# Patient Record
Sex: Male | Born: 1950 | Race: Black or African American | Hispanic: No | Marital: Married | Smoking: Never smoker
Health system: Southern US, Community
[De-identification: ages and names within clinical notes are randomized; demographics above are authoritative.]

## PROBLEM LIST (undated history)

## (undated) DIAGNOSIS — E119 Type 2 diabetes mellitus without complications: Secondary | ICD-10-CM

## (undated) DIAGNOSIS — Z9889 Other specified postprocedural states: Secondary | ICD-10-CM

## (undated) DIAGNOSIS — E785 Hyperlipidemia, unspecified: Secondary | ICD-10-CM

## (undated) DIAGNOSIS — C61 Malignant neoplasm of prostate: Secondary | ICD-10-CM

## (undated) HISTORY — DX: Hyperlipidemia, unspecified: E78.5

## (undated) HISTORY — PX: OTHER SURGICAL HISTORY: SHX169

---

## 1984-08-26 HISTORY — PX: OTHER SURGICAL HISTORY: SHX169

## 2001-02-19 ENCOUNTER — Encounter: Admission: RE | Admit: 2001-02-19 | Discharge: 2001-02-19 | Payer: Self-pay | Admitting: Family Medicine

## 2001-02-19 ENCOUNTER — Encounter: Payer: Self-pay | Admitting: Family Medicine

## 2001-02-23 ENCOUNTER — Encounter: Payer: Self-pay | Admitting: Family Medicine

## 2001-02-23 ENCOUNTER — Encounter: Admission: RE | Admit: 2001-02-23 | Discharge: 2001-02-23 | Payer: Self-pay | Admitting: Family Medicine

## 2001-08-12 ENCOUNTER — Encounter: Payer: Self-pay | Admitting: Family Medicine

## 2001-08-12 ENCOUNTER — Encounter: Admission: RE | Admit: 2001-08-12 | Discharge: 2001-08-12 | Payer: Self-pay | Admitting: Family Medicine

## 2002-12-24 ENCOUNTER — Encounter: Payer: Self-pay | Admitting: Internal Medicine

## 2002-12-24 ENCOUNTER — Encounter: Admission: RE | Admit: 2002-12-24 | Discharge: 2002-12-24 | Payer: Self-pay | Admitting: Internal Medicine

## 2003-11-15 ENCOUNTER — Encounter: Admission: RE | Admit: 2003-11-15 | Discharge: 2003-11-15 | Payer: Self-pay | Admitting: Internal Medicine

## 2004-07-10 ENCOUNTER — Encounter: Admission: RE | Admit: 2004-07-10 | Discharge: 2004-07-10 | Payer: Self-pay | Admitting: Internal Medicine

## 2005-03-25 ENCOUNTER — Encounter: Admission: RE | Admit: 2005-03-25 | Discharge: 2005-03-25 | Payer: Self-pay | Admitting: Internal Medicine

## 2005-06-12 ENCOUNTER — Encounter: Admission: RE | Admit: 2005-06-12 | Discharge: 2005-06-12 | Payer: Self-pay | Admitting: Internal Medicine

## 2007-04-15 ENCOUNTER — Ambulatory Visit (HOSPITAL_COMMUNITY): Admission: RE | Admit: 2007-04-15 | Discharge: 2007-04-15 | Payer: Self-pay | Admitting: Occupational Medicine

## 2007-06-15 ENCOUNTER — Encounter: Admission: RE | Admit: 2007-06-15 | Discharge: 2007-06-15 | Payer: Self-pay | Admitting: Internal Medicine

## 2007-09-09 ENCOUNTER — Emergency Department (HOSPITAL_COMMUNITY): Admission: EM | Admit: 2007-09-09 | Discharge: 2007-09-09 | Payer: Self-pay | Admitting: Emergency Medicine

## 2008-07-08 ENCOUNTER — Emergency Department (HOSPITAL_BASED_OUTPATIENT_CLINIC_OR_DEPARTMENT_OTHER): Admission: EM | Admit: 2008-07-08 | Discharge: 2008-07-08 | Payer: Self-pay | Admitting: Emergency Medicine

## 2008-07-27 ENCOUNTER — Encounter: Admission: RE | Admit: 2008-07-27 | Discharge: 2008-07-27 | Payer: Self-pay | Admitting: Internal Medicine

## 2008-10-02 ENCOUNTER — Ambulatory Visit: Payer: Self-pay | Admitting: Interventional Radiology

## 2008-10-02 ENCOUNTER — Emergency Department (HOSPITAL_BASED_OUTPATIENT_CLINIC_OR_DEPARTMENT_OTHER): Admission: EM | Admit: 2008-10-02 | Discharge: 2008-10-02 | Payer: Self-pay | Admitting: Emergency Medicine

## 2008-10-03 ENCOUNTER — Inpatient Hospital Stay (HOSPITAL_COMMUNITY): Admission: EM | Admit: 2008-10-03 | Discharge: 2008-10-04 | Payer: Self-pay | Admitting: Emergency Medicine

## 2009-05-18 ENCOUNTER — Encounter: Admission: RE | Admit: 2009-05-18 | Discharge: 2009-05-18 | Payer: Self-pay | Admitting: Internal Medicine

## 2010-12-11 LAB — PROTIME-INR
INR: 1 (ref 0.00–1.49)
Prothrombin Time: 14.6 seconds (ref 11.6–15.2)

## 2010-12-11 LAB — CBC
HCT: 44.8 % (ref 39.0–52.0)
Hemoglobin: 15.5 g/dL (ref 13.0–17.0)
Hemoglobin: 15.6 g/dL (ref 13.0–17.0)
MCHC: 33.9 g/dL (ref 30.0–36.0)
MCHC: 33.9 g/dL (ref 30.0–36.0)
MCV: 83.9 fL (ref 78.0–100.0)
MCV: 83.9 fL (ref 78.0–100.0)
Platelets: 181 10*3/uL (ref 150–400)
Platelets: 189 10*3/uL (ref 150–400)
RBC: 5.45 MIL/uL (ref 4.22–5.81)
RBC: 5.51 MIL/uL (ref 4.22–5.81)
RBC: 5.69 MIL/uL (ref 4.22–5.81)
RDW: 12.5 % (ref 11.5–15.5)
RDW: 11.7 % (ref 11.5–15.5)
WBC: 5 10*3/uL (ref 4.0–10.5)
WBC: 6.5 10*3/uL (ref 4.0–10.5)

## 2010-12-11 LAB — BASIC METABOLIC PANEL
BUN: 11 mg/dL (ref 6–23)
Calcium: 9.6 mg/dL (ref 8.4–10.5)
Creatinine, Ser: 1.02 mg/dL (ref 0.4–1.5)
Creatinine, Ser: 1.1 mg/dL (ref 0.4–1.5)
GFR calc Af Amer: >60 mL/min (ref >60)
GFR calc non Af Amer: >60 mL/min (ref >60)
GFR calc non Af Amer: >60 mL/min (ref >60)
Glucose, Bld: 82 mg/dL (ref 70–99)
Sodium: 141 mEq/L (ref 135–145)

## 2010-12-11 LAB — COMPREHENSIVE METABOLIC PANEL
Albumin: 4 g/dL (ref 3.5–5.2)
Alkaline Phosphatase: 102 U/L (ref 39–117)
BUN: 13 mg/dL (ref 6–23)
CO2: 29 mEq/L (ref 19–32)
Calcium: 8.8 mg/dL (ref 8.4–10.5)
GFR calc Af Amer: >60 mL/min (ref >60)
GFR calc non Af Amer: >60 mL/min (ref >60)
Glucose, Bld: 147 mg/dL — ABNORMAL HIGH (ref 70–99)
Potassium: 3.3 mEq/L — ABNORMAL LOW (ref 3.5–5.1)

## 2010-12-11 LAB — CARDIAC PANEL(CRET KIN+CKTOT+MB+TROPI)
Relative Index: 1 (ref 0.0–2.5)
Total CK: 208 U/L (ref 7–232)
Troponin I: 0.01 ng/mL (ref 0.00–0.06)

## 2010-12-11 LAB — DIFFERENTIAL
Basophils Absolute: 0 10*3/uL (ref 0.0–0.1)
Basophils Absolute: 0 10*3/uL (ref 0.0–0.1)
Basophils Relative: 1 % (ref 0–1)
Basophils Relative: 1 % (ref 0–1)
Eosinophils Absolute: 0.1 10*3/uL (ref 0.0–0.7)
Eosinophils Relative: 2 % (ref 0–5)
Lymphocytes Relative: 37 % (ref 12–46)
Lymphocytes Relative: 46 % (ref 12–46)
Lymphs Abs: 1.7 10*3/uL (ref 0.7–4.0)
Monocytes Absolute: 0.3 10*3/uL (ref 0.1–1.0)
Monocytes Absolute: 0.3 10*3/uL (ref 0.1–1.0)
Neutro Abs: 2.5 10*3/uL (ref 1.7–7.7)
Neutro Abs: 2.3 10*3/uL (ref 1.7–7.7)
Neutrophils Relative %: 54 % (ref 43–77)
Neutrophils Relative %: 45 % (ref 43–77)

## 2010-12-11 LAB — LIPID PANEL
HDL: 22 mg/dL — ABNORMAL LOW (ref >39)
Total CHOL/HDL Ratio: 8 RATIO
Triglycerides: 176 mg/dL — ABNORMAL HIGH (ref <150)

## 2010-12-11 LAB — CK TOTAL AND CKMB (NOT AT ARMC): Relative Index: 1.1 (ref 0.0–2.5)

## 2010-12-11 LAB — POCT CARDIAC MARKERS: Troponin i, poc: <0.05 ng/mL (ref 0.00–0.09)

## 2010-12-11 LAB — HIGH SENSITIVITY CRP: CRP, High Sensitivity: 3.7 mg/L — ABNORMAL HIGH

## 2010-12-11 LAB — PROTHROMBIN GENE MUTATION

## 2010-12-11 LAB — APTT: aPTT: 33 seconds (ref 24–37)

## 2010-12-11 LAB — HEPARIN LEVEL (UNFRACTIONATED): Heparin Unfractionated: 1 IU/mL — ABNORMAL HIGH (ref 0.30–0.70)

## 2011-01-08 NOTE — Discharge Summary (Signed)
Nathan [name], Nathan [name]              ACCOUNT NO.:  [id number]   MEDICAL RECORD NO.:  [id number]          PATIENT TYPE:  INP   LOCATION:  2013                         FACILITY:  [facility]   PHYSICIAN:  Eduardo Osier. Sharyn Lull, M.D. DATE OF BIRTH:  1950/10/14   DATE OF ADMISSION:  10/03/2008  DATE OF DISCHARGE:  10/04/2008                               DISCHARGE SUMMARY   ADMITTING DIAGNOSIS:  New-onset angina, rule out myocardial infarction.   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   DISCHARGE HOME MEDICATIONS:  1. Enteric-coated aspirin 81 mg 1 tablet daily.  2. Proscar 5 mg 1 tablet daily.  3. Nexium 40 mg 1 capsule daily half an hour before breakfast.  4. Niaspan 500 mg 1 tablet daily at night.   DIET:  Low-salt, low-cholesterol.   ACTIVITY:  Post cardiac cath instructions have been given.  Avoid any  lifting.   FOLLOWUP:  Follow up with Dr. Shana Chute in 1 week.  Follow up with me in 1  week.   CONDITION AT DISCHARGE:  Stable.   BRIEF HISTORY AND HOSPITAL COURSE:  Mr. Nathan [name] is a 60 year old  black man with no significant past medical history.  He came to the ER  complaining of recurrent left-sided chest pain, left-sided discomfort  radiating to the left arm, where he received aspirin and sublingual  nitro with relief.  Denies any nausea, vomiting, or diaphoresis.  Denies  shortness of breath.  Denies palpitations, lightheadedness, or syncope.  The patient was seen in [facility] with similar chest pain  and had EKG done, which showed minimal ST elevation in the inferior  leads without any reciprocal changes.  The patient was noted to have  mild negative cardiac enzymes and was discharged, but due to recurrent  chest pain, came to ER.  The patient presently denies any chest pain.   PAST MEDICAL HISTORY:  As above.   PAST SURGICAL HISTORY:  None.   ALLERGIES:  None.   MEDICATION:  None.   SOCIAL HISTORY:  He is married, 3 children.  He works as Orthoptist at  [employer].  No history of smoking or alcohol abuse.   FAMILY HISTORY:  Noncontributory.   PHYSICAL EXAMINATION:  GENERAL:  He is alert, awake, and oriented x3, in  no acute distress.  VITAL SIGNS:  Blood pressure was 105/61, pulse was 82.  HEENT:  Conjunctivae were pink.  NECK:  Supple.  No JVD.  No bruits.  LUNGS:  Clear to auscultation without rhonchi or rales.  CARDIOVASCULAR:  S1 and S2 was normal.  There was no S3 or S4 gallop.  ABDOMEN:  Soft.  Bowel sounds present.  Nontender.  EXTREMITIES:  There is no clubbing, cyanosis, or edema.   LABORATORY DATA:  Hemoglobin was 15.5, hematocrit 44.8, white count of  4.7.  CK was 219, MB 2.5, myoglobin  0.51,  second set CK was 208, MB  2.1, myoglobin 1.0, troponin Itwo sets was less than 0.01.  elevated at  3.7, total cholesterol  of 174, triglyceride 174, HDL was low at 22, LDL  1.9.   BRIEF HOSPITAL COURSE:  The patient was admitted to telemetry unit.  MI  was ruled out by serial enzymes and EKG.  The patient subsequently  underwent left cardiac cath.  As per procedure report, the patient  tolerated procedure well.  There were no complications.  The patient did  not have any episodes of chest pain during the hospital stay.  Postprocedure, the patient did not have any chest pain .  The patient  has been ambulating in hallway without any problems.  The patient will  be discharged home on above medications, and he will be followed up in  my office in 1 week.      Eduardo Osier. Sharyn Lull, M.D.  Electronically Signed     [facility]/MEDQ  D:  10/04/2008  T:  10/05/2008  Job:  [id number]

## 2011-01-08 NOTE — Cardiovascular Report (Signed)
[name], [name]              ACCOUNT NO.:  [id number]   MEDICAL RECORD NO.:  [id number]          PATIENT TYPE:  INP   LOCATION:  2013                         FACILITY:  [facility]   PHYSICIAN:  Eduardo Osier. Sharyn Lull, M.D. DATE OF BIRTH:  Feb 22, 1951   DATE OF PROCEDURE:  10/04/2008  DATE OF DISCHARGE:                            CARDIAC CATHETERIZATION   PROCEDURE:  Left cardiac cath with selective left and right coronary  angiography, LV graft via right groin using Judkins technique.   INDICATIONS FOR PROCEDURE:  Nathan [name] is a 60 year old black male  with no significant past medical history.  He came to the ER complaining  of recurrent left-sided chest pain described as discomfort radiating to  the left arm.  The patient received aspirin and sublingual nitro with  relief of chest pain.  Denies any nausea, vomiting, diaphoresis.  Denies  shortness of breath.  Denies palpitation, lightheadedness, or syncope.  The patient was seen in [facility] with similar pain and had  EKG done yesterday, which showed minimal ST elevation in inferior leads  without any rest focal changes.  The patient denies any chest pain at  present.  His 2 sets of cardiac enzymes have been negative.   PAST MEDICAL HISTORY:  As above.   PAST SURGICAL HISTORY:  None.   ALLERGIES:  None.   MEDICATIONS:  None.   SOCIAL HISTORY:  He is married, 3 children.  No history of smoking or  alcohol abuse.   FAMILY HISTORY:  Noncontributory.   PHYSICAL EXAMINATION:  GENERAL:  He is alert, awake, oriented x3 in no  acute distress.  VITAL SIGNS:  Blood pressure was 105/61, pulse was 82, regular.  HEENT:  Conjunctivae was pink.  NECK:  Supple.  No JVD, no bruit.  LUNGS:  Clear to auscultation without rhonchi or rales.  CARDIOVASCULAR:  S1 and S2 was normal.  There was no S3 or S4 gallop.  ABDOMEN:  Soft.  Bowel sounds were present, nontender.  EXTREMITIES:  There is no clubbing, cyanosis, or edema.   The  patient was admitted to the Telemetry Unit.  MI was ruled out by  serial enzymes and EKG.  Discussed with the patient regarding left cath,  possible PTCA stenting, its risks and benefits, i.e. death, MI, stroke,  need for emergency CABG, risk of restenosis, local vascular  complications, etc., and consented for PCI.   PROCEDURE:  After obtaining the informed consent, the patient was  brought to the cath lab and was placed on fluoroscopy table.  The right  groin was prepped and draped in usual fashion.  Xylocaine 2% was used  for local anesthesia in the right groin.  With the help of thin-wall  needle, 6-French arterial sheath was placed.  The sheath was aspirated  and flushed.  Next, 6-French left Judkins catheter was advanced over the  wire under fluoroscopic guidance up to the ascending aorta.  Wire was  pulled out, the catheter was aspirated, and connected to the manifold.  Catheter was further advanced and engaged into left coronary ostium.  Multiple views of the left system were  taken.  Next, the catheter was  disengaged and was pulled out over the wire and was replaced with 6-  [place] right Judkins catheter, which was advanced over the wire under  fluoroscopic guidance up to the ascending aorta.  Wire was pulled out,  the catheter was aspirated and connected to the manifold.  Catheter was  further advanced and engaged into right coronary ostium.  Multiple views  of the right system were taken.  Next, the catheter was disengaged and  was pulled out over the wire and was replaced with 6-French pigtail  catheter, which was advanced over the wire under fluoroscopic guidance  up to the ascending aorta.  Catheter was further advanced across the  aortic valve into the LV.  LV pressures were recorded.  Next, LV graft  was done in 30-degree RAO position.  Post-angiographic pressures were  recorded from LV and then pullback pressures were recorded from the  aorta.  There was no gradient  across the aortic valve.  Next, a pigtail  catheter was pulled out over the wire.  Sheaths were aspirated and  flushed.   FINDINGS:  LV showed good LV systolic function.  EF of 55-60%.  Left  main was patent.  LAD was patent.  Diagonal 1 to 3 were very small,  which were patent.  Diagonal 4 and 5 were very very small.  Left  circumflex has 10-15% mid stenosis and then it tapers down in AV groove.  OM-1 is very small, which is patent.  OM-2 and 3 were moderate size,  which is patent.  RCA is patent.  PDA and PLV branches were also patent.  The patient tolerated procedure well.  There were no complications.  The  patient was transferred to recovery room in stable condition.      Eduardo Osier. Sharyn Lull, M.D.  Electronically Signed     MNH/MEDQ  D:  10/04/2008  T:  10/04/2008  Job:  [id number]   cc:   Cath Lab

## 2011-05-16 LAB — POCT CARDIAC MARKERS
Operator id: 294501
Troponin i, poc: <0.05

## 2011-05-16 LAB — CBC
HCT: 45.5
Hemoglobin: 15.2
MCHC: 33.5
MCV: 83.2
Platelets: 197
RDW: 12.6

## 2011-05-16 LAB — DIFFERENTIAL
Basophils Absolute: 0
Basophils Relative: 1
Eosinophils Absolute: 0.1
Eosinophils Relative: 2
Lymphocytes Relative: 41
Monocytes Absolute: 0.3

## 2011-05-16 LAB — I-STAT 8, (EC8 V) (CONVERTED LAB)
BUN: 12
Bicarbonate: 28.4 — ABNORMAL HIGH
Chloride: 108
Glucose, Bld: 109 — ABNORMAL HIGH
HCT: 49
Hemoglobin: 16.7
Operator id: 294501
Potassium: 4.8
Sodium: 140

## 2013-10-26 ENCOUNTER — Other Ambulatory Visit (HOSPITAL_COMMUNITY): Payer: Self-pay | Admitting: Urology

## 2013-10-26 DIAGNOSIS — R972 Elevated prostate specific antigen [PSA]: Secondary | ICD-10-CM

## 2013-11-12 ENCOUNTER — Ambulatory Visit (HOSPITAL_COMMUNITY)
Admission: RE | Admit: 2013-11-12 | Discharge: 2013-11-12 | Disposition: A | Discharge status: Home / Self Care | Payer: 59 | Source: Ambulatory Visit | Attending: Urology | Admitting: Urology

## 2013-11-12 DIAGNOSIS — R972 Elevated prostate specific antigen [PSA]: Secondary | ICD-10-CM | POA: Insufficient documentation

## 2013-11-12 LAB — CREATININE, SERUM
CREATININE: 1.21 mg/dL (ref 0.50–1.35)
GFR calc Af Amer: 73 mL/min — ABNORMAL LOW (ref >90)
GFR calc non Af Amer: 63 mL/min — ABNORMAL LOW (ref >90)

## 2013-11-12 MED ORDER — GADOBENATE DIMEGLUMINE 529 MG/ML IV SOLN
20.0000 mL | Freq: Once | INTRAVENOUS | Status: AC | PRN
Start: 2013-11-12 — End: 2013-11-12
  Administered 2013-11-12: 20 mL via INTRAVENOUS

## 2014-06-20 ENCOUNTER — Encounter (HOSPITAL_BASED_OUTPATIENT_CLINIC_OR_DEPARTMENT_OTHER): Payer: Self-pay | Admitting: Emergency Medicine

## 2014-06-20 ENCOUNTER — Emergency Department (HOSPITAL_BASED_OUTPATIENT_CLINIC_OR_DEPARTMENT_OTHER): Payer: 59

## 2014-06-20 ENCOUNTER — Emergency Department (HOSPITAL_BASED_OUTPATIENT_CLINIC_OR_DEPARTMENT_OTHER)
Admission: EM | Admit: 2014-06-20 | Discharge: 2014-06-20 | Disposition: A | Discharge status: Home / Self Care | Payer: 59 | Attending: Emergency Medicine | Admitting: Emergency Medicine

## 2014-06-20 DIAGNOSIS — M79601 Pain in right arm: Secondary | ICD-10-CM | POA: Insufficient documentation

## 2014-06-20 DIAGNOSIS — R079 Chest pain, unspecified: Secondary | ICD-10-CM | POA: Diagnosis present

## 2014-06-20 DIAGNOSIS — R531 Weakness: Secondary | ICD-10-CM

## 2014-06-20 DIAGNOSIS — Z79899 Other long term (current) drug therapy: Secondary | ICD-10-CM | POA: Diagnosis not present

## 2014-06-20 DIAGNOSIS — M6281 Muscle weakness (generalized): Secondary | ICD-10-CM | POA: Insufficient documentation

## 2014-06-20 DIAGNOSIS — E119 Type 2 diabetes mellitus without complications: Secondary | ICD-10-CM | POA: Insufficient documentation

## 2014-06-20 HISTORY — DX: Type 2 diabetes mellitus without complications: E11.9

## 2014-06-20 LAB — CBC WITH DIFFERENTIAL/PLATELET
Basophils Absolute: 0 10*3/uL (ref 0.0–0.1)
Basophils Relative: 1 % (ref 0–1)
EOS ABS: 0.1 10*3/uL (ref 0.0–0.7)
Eosinophils Relative: 2 % (ref 0–5)
HCT: 45 % (ref 39.0–52.0)
HEMOGLOBIN: 14.8 g/dL (ref 13.0–17.0)
Lymphocytes Relative: 47 % — ABNORMAL HIGH (ref 12–46)
Lymphs Abs: 2.5 10*3/uL (ref 0.7–4.0)
MCH: 27.3 pg (ref 26.0–34.0)
MCHC: 32.9 g/dL (ref 30.0–36.0)
MCV: 83 fL (ref 78.0–100.0)
MONOS PCT: 6 % (ref 3–12)
Monocytes Absolute: 0.3 10*3/uL (ref 0.1–1.0)
NEUTROS ABS: 2.4 10*3/uL (ref 1.7–7.7)
NEUTROS PCT: 44 % (ref 43–77)
Platelets: 206 10*3/uL (ref 150–400)
RBC: 5.42 MIL/uL (ref 4.22–5.81)
RDW: 12.8 % (ref 11.5–15.5)
WBC: 5.4 10*3/uL (ref 4.0–10.5)

## 2014-06-20 LAB — COMPREHENSIVE METABOLIC PANEL
ALK PHOS: 113 U/L (ref 39–117)
ALT: 24 U/L (ref 0–53)
ANION GAP: 13 (ref 5–15)
AST: 22 U/L (ref 0–37)
Albumin: 4.1 g/dL (ref 3.5–5.2)
BILIRUBIN TOTAL: 0.6 mg/dL (ref 0.3–1.2)
BUN: 15 mg/dL (ref 6–23)
CO2: 27 mEq/L (ref 19–32)
Calcium: 9.6 mg/dL (ref 8.4–10.5)
Chloride: 98 mEq/L (ref 96–112)
Creatinine, Ser: 1.2 mg/dL (ref 0.50–1.35)
GFR calc non Af Amer: 63 mL/min — ABNORMAL LOW (ref >90)
GFR, EST AFRICAN AMERICAN: 73 mL/min — AB (ref >90)
GLUCOSE: 147 mg/dL — AB (ref 70–99)
POTASSIUM: 4 meq/L (ref 3.7–5.3)
Sodium: 138 mEq/L (ref 137–147)
Total Protein: 7.7 g/dL (ref 6.0–8.3)

## 2014-06-20 LAB — CBG MONITORING, ED: GLUCOSE-CAPILLARY: 140 mg/dL — AB (ref 70–99)

## 2014-06-20 LAB — TROPONIN I: Troponin I: <0.3 ng/mL (ref <0.30)

## 2014-06-20 MED ORDER — SODIUM CHLORIDE 0.9 % IV BOLUS (SEPSIS)
500.0000 mL | Freq: Once | INTRAVENOUS | Status: AC
Start: 2014-06-20 — End: 2014-06-20
  Administered 2014-06-20: 500 mL via INTRAVENOUS

## 2014-06-20 NOTE — ED Provider Notes (Signed)
CSN: [id number]     Arrival date & time 06/20/14  1713 History  This chart was scribed for Pamella Pert, MD by Molli Posey, ED Scribe. This patient was seen in room MH02/MH02 and the patient's care was started 6:27 PM.  Chief Complaint  Patient presents with  . Chest Pain    Patient is a 63 y.o. male presenting with chest pain. The history is provided by the patient. No language interpreter was used.  Chest Pain Pain location:  Unable to specify Pain radiates to:  Does not radiate Pain radiates to the back: no   Pain severity:  No pain Duration:  3 days Relieved by:  Rest Associated symptoms: no abdominal pain, no back pain, no cough, no fatigue, no headache and no numbness   Risk factors: diabetes mellitus    HPI Comments: Nathan [name] is a 63 y.o. male with a history of DM who presents to the Emergency Department complaining of arm and shoulder discomfort that started several days ago. Pt reports associated sudden, intermittent arm soreness in his right shoulder and arm. Pt reports he just doesn't feel like he's at his normal baseline the past several days. Pt reports he is feeling much better at the moment. Pt denies CP and SOB as associated symptoms. Pt reports he takes metformin.   PCP CABEZA   Past Medical History  Diagnosis Date  . Diabetes mellitus without complication    Past Surgical History  Procedure Laterality Date  . Cardiac catheterization     No family history on file. History  Substance Use Topics  . Smoking status: Not on file  . Smokeless tobacco: Not on file  . Alcohol Use: Not on file    Review of Systems  Constitutional: Negative for appetite change and fatigue.  HENT: Negative for congestion, ear discharge and sinus pressure.   Eyes: Negative for discharge.  Respiratory: Negative for cough.   Cardiovascular: Negative for chest pain.  Gastrointestinal: Negative for abdominal pain and diarrhea.  Genitourinary: Negative for dysuria,  frequency and hematuria.  Musculoskeletal: Positive for myalgias. Negative for back pain.  Skin: Negative for rash.  Neurological: Negative for seizures, numbness and headaches.  Psychiatric/Behavioral: Negative for hallucinations.  All other systems reviewed and are negative.   Allergies  Review of patient's allergies indicates no known allergies.  Home Medications   Prior to Admission medications   Medication Sig Start Date End Date Taking? Authorizing Provider  metFORMIN (GLUCOPHAGE) 500 MG tablet Take 500 mg by mouth 2 (two) times daily with a meal.   Yes Historical Provider, MD   BP 132/82  Pulse 82  Temp(Src) 98 F (36.7 C) (Oral)  Resp 16  Ht 6' (1.829 m)  Wt 228 lb (103.42 kg)  BMI 30.92 kg/m2  SpO2 98%  Physical Exam  Nursing note and vitals reviewed. Constitutional: He is oriented to person, place, and time. He appears well-developed.  HENT:  Head: Normocephalic and atraumatic.  Eyes: Conjunctivae and EOM are normal. Pupils are equal, round, and reactive to light. No scleral icterus.  Neck: Neck supple. No thyromegaly present.  Cardiovascular: Normal rate, regular rhythm and normal heart sounds.  Exam reveals no gallop and no friction rub.   No murmur heard. Pulmonary/Chest: Effort normal and breath sounds normal. No stridor. He has no wheezes. He has no rales. He exhibits no tenderness.  Abdominal: Bowel sounds are normal. He exhibits no distension. There is no tenderness. There is no rebound.  Musculoskeletal: Normal range of motion.  He exhibits no edema and no tenderness.  2+ distal and proximal pulses in upper extremities, no focal tenderness of upper extremities.   Lymphadenopathy:    He has no cervical adenopathy.  Neurological: He is alert and oriented to person, place, and time. He exhibits normal muscle tone. Coordination normal.  alert, oriented x3 speech: normal in context and clarity memory: intact grossly cranial nerves II-XII: intact motor  strength: full proximally and distally no involuntary movements or tremors sensation: intact to light touch diffusely  cerebellar: finger-to-nose and heel-to-shin intact gait: normal forwards and backwards  Skin: Skin is warm and dry. No rash noted. No erythema.  Psychiatric: He has a normal mood and affect. His behavior is normal.    ED Course  Procedures  DIAGNOSTIC STUDIES: Oxygen Saturation is 98% on RA, normal by my interpretation.    COORDINATION OF CARE: 6:56 PM Discussed treatment plan with pt at bedside and pt agreed to plan.   Labs Review Labs Reviewed  CBC WITH DIFFERENTIAL - Abnormal; Notable for the following:    Lymphocytes Relative 47 (*)    All other components within normal limits  COMPREHENSIVE METABOLIC PANEL - Abnormal; Notable for the following:    Glucose, Bld 147 (*)    GFR calc non Af Amer 63 (*)    GFR calc Af Amer 73 (*)    All other components within normal limits  CBG MONITORING, ED - Abnormal; Notable for the following:    Glucose-Capillary 140 (*)    All other components within normal limits  TROPONIN I    Imaging Review Dg Chest 2 View  06/20/2014   CLINICAL DATA:  Right arm pain. Feeling weak all over. Initial encounter  EXAM: CHEST  2 VIEW  COMPARISON:  10/03/2008  FINDINGS: Normal heart size and mediastinal contours. No acute infiltrate or edema. No effusion or pneumothorax. No acute osseous findings.  IMPRESSION: No active cardiopulmonary disease.   Electronically Signed   By: Jorje Guild M.D.   On: 06/20/2014 19:33     EKG Interpretation   Date/Time:  Monday June 20 2014 17:36:14 EDT Ventricular Rate:  87 PR Interval:  168 QRS Duration: 94 QT Interval:  360 QTC Calculation: 433 R Axis:   30 Text Interpretation:  Normal sinus rhythm with sinus arrhythmia  Nonspecific T wave abnormality t wave inversions previously in V5-V6 no  longer seen Confirmed by Rajan Burgard  MD, Chele Cornell ([id number]) on 06/20/2014  7:01:38 PM      MDM    Final diagnoses:  Right arm pain  Generalized weakness   8:01 PM 63 y.o. male w hx of DM  Who presents with intermittent right shoulder pain and feeling of generalized weakness and occasional body aches over the last few days. He denies any focal weakness , fevers, chest pain, shortness of breath. He is afebrile and vital signs are unremarkable here. He has a normal neurologic exam. He believes his sx to be d/t stress and is feeling better currently. Will get screening lab work. Unlikely that his shoulder pain is a cp equivalent given that it is occasionally reproducible to him w/ certain movements of the right shoulder.   8:01 PM: I interpreted/reviewed the labs and/or imaging which were non-contributory.  I have discussed the diagnosis/risks/treatment options with the patient and believe the pt to be eligible for discharge home to follow-up with his pcp. We also discussed returning to the ED immediately if new or worsening sx occur. We discussed the sx which are most  concerning (e.g., cp, sob, fever) that necessitate immediate return. Medications administered to the patient during their visit and any new prescriptions provided to the patient are listed below.  Medications given during this visit Medications  sodium chloride 0.9 % bolus 500 mL (not administered)    Discharge Medication List as of 06/20/2014  8:26 PM        I personally performed the services described in this documentation, which was scribed in my presence. The recorded information has been reviewed and is accurate.      Pamella Pert, MD 06/21/14 1137

## 2014-06-20 NOTE — ED Notes (Signed)
MD at bedside. 

## 2014-06-20 NOTE — ED Notes (Signed)
Pt. Is in no distress and has no pain in chest just reports he has not been feeling himself.  Pt. Reports he just doesn't feel himself the past several days.

## 2014-06-20 NOTE — ED Notes (Signed)
Pt d/c home- no new rx given- note for work given

## 2014-08-25 ENCOUNTER — Ambulatory Visit (HOSPITAL_COMMUNITY)
Admission: RE | Admit: 2014-08-25 | Discharge: 2014-08-25 | Disposition: A | Discharge status: Home / Self Care | Payer: 59 | Source: Ambulatory Visit | Attending: Internal Medicine | Admitting: Internal Medicine

## 2014-08-25 ENCOUNTER — Other Ambulatory Visit (HOSPITAL_COMMUNITY): Payer: Self-pay | Admitting: Internal Medicine

## 2014-08-25 DIAGNOSIS — G8929 Other chronic pain: Secondary | ICD-10-CM | POA: Diagnosis not present

## 2014-08-25 DIAGNOSIS — M25511 Pain in right shoulder: Secondary | ICD-10-CM | POA: Insufficient documentation

## 2014-08-25 DIAGNOSIS — M25572 Pain in left ankle and joints of left foot: Secondary | ICD-10-CM

## 2014-08-25 DIAGNOSIS — M25561 Pain in right knee: Secondary | ICD-10-CM | POA: Insufficient documentation

## 2014-08-25 DIAGNOSIS — M25562 Pain in left knee: Secondary | ICD-10-CM | POA: Insufficient documentation

## 2015-04-01 ENCOUNTER — Emergency Department (HOSPITAL_BASED_OUTPATIENT_CLINIC_OR_DEPARTMENT_OTHER)
Admission: EM | Admit: 2015-04-01 | Discharge: 2015-04-01 | Disposition: A | Discharge status: Home / Self Care | Payer: 59 | Attending: Emergency Medicine | Admitting: Emergency Medicine

## 2015-04-01 ENCOUNTER — Encounter (HOSPITAL_BASED_OUTPATIENT_CLINIC_OR_DEPARTMENT_OTHER): Payer: Self-pay | Admitting: Emergency Medicine

## 2015-04-01 ENCOUNTER — Emergency Department (HOSPITAL_BASED_OUTPATIENT_CLINIC_OR_DEPARTMENT_OTHER): Payer: 59

## 2015-04-01 DIAGNOSIS — Z79899 Other long term (current) drug therapy: Secondary | ICD-10-CM | POA: Diagnosis not present

## 2015-04-01 DIAGNOSIS — E119 Type 2 diabetes mellitus without complications: Secondary | ICD-10-CM | POA: Diagnosis not present

## 2015-04-01 DIAGNOSIS — R52 Pain, unspecified: Secondary | ICD-10-CM

## 2015-04-01 DIAGNOSIS — N508 Other specified disorders of male genital organs: Secondary | ICD-10-CM | POA: Diagnosis present

## 2015-04-01 DIAGNOSIS — N50819 Testicular pain, unspecified: Secondary | ICD-10-CM

## 2015-04-01 LAB — URINALYSIS, ROUTINE W REFLEX MICROSCOPIC
Bilirubin Urine: NEGATIVE
Glucose, UA: NEGATIVE mg/dL
Hgb urine dipstick: NEGATIVE
Ketones, ur: NEGATIVE mg/dL
Leukocytes, UA: NEGATIVE
NITRITE: NEGATIVE
Protein, ur: NEGATIVE mg/dL
SPECIFIC GRAVITY, URINE: 1.02 (ref 1.005–1.030)
UROBILINOGEN UA: 0.2 mg/dL (ref 0.0–1.0)
pH: 6 (ref 5.0–8.0)

## 2015-04-01 NOTE — ED Provider Notes (Signed)
Patient discussed with PA. Patient examined. History reviewed.  Ports right testicle pain upon awakening this morning. He states when he felt the testicle he felt like it was high riding or "sitting on top of the other one". He was up and around for 5-10 minutes and it resolved. He states he had "a minimal ache" in his right hemiscrotum that he has had for years. No difficulty with urination. No abdominal pain. No nausea vomiting dysuria frequency hematuria. Follows with Dr.Nesi, at [facility] urology.  Xanax shows symmetric testicle lie. Bilateral intact cremasteric reflexes. No dominant masses. Normal contour. No enlargement of the epididymis. No inguinal hernia. No right lower quadrant tenderness.  Ultrasound normal. He may have had a torsion that resolved itself. This may been a positional phenomenon is a resolved quickly upon arising.  For outpatient treatment. Return if recurrence. Routine urology follow-up.  Tanna Furry, MD 04/01/15 [phone]

## 2015-04-01 NOTE — Discharge Instructions (Signed)
Recheck with any recurrence of your severe testicle pain.  Routine follow-up with your urologist [facility].

## 2015-04-01 NOTE — ED Notes (Signed)
Patient does not want line or labs right now, MD concurs  and on hold until after u/s if needed.

## 2015-04-01 NOTE — ED Notes (Addendum)
Pt c/o RLQ pain and R testicle soreness and swelling upon waking this am, states pain has decreased since this am but is still uncomfortable. Urologist told him to come in for an ultrasound.

## 2015-04-01 NOTE — ED Provider Notes (Signed)
CSN: [id number]     Arrival date & time 04/01/15  1422 History   First MD Initiated Contact with Patient 04/01/15 1426     Chief Complaint  Patient presents with  . Abdominal Pain  . Testicle Pain     (Consider location/radiation/quality/duration/timing/severity/associated sxs/prior Treatment) HPI  Blood pressure 123/72, pulse 96, temperature 98.3 F (36.8 C), temperature source Oral, resp. rate 18, height 5\' 10"  (1.778 m), weight 228 lb (103.42 kg), SpO2 95 %.  Shadow ZAYQUAN BOGARD is a 64 y.o. male complaining of severe acute onset of right testicular pain this morning when he woke, states it was 10 out of 10 initially. Associated with right testicular swelling and radiates up the right groin. States that the pain spontaneously improved to 6 out of 10 right now. Patient discussed this with his urologist who advised him to present to the [facility] ED for evaluation. Patient denies dysuria, hematuria, fever, chills, change in bowel movements, nausea or vomiting  Past Medical History  Diagnosis Date  . Diabetes mellitus without complication    Past Surgical History  Procedure Laterality Date  . Cardiac catheterization     History reviewed. No pertinent family history. History  Substance Use Topics  . Smoking status: Never Smoker   . Smokeless tobacco: Never Used  . Alcohol Use: No    Review of Systems  10 systems reviewed and found to be negative, except as noted in the HPI.   Allergies  Review of patient's allergies indicates no known allergies.  Home Medications   Prior to Admission medications   Medication Sig Start Date End Date Taking? Authorizing Provider  metFORMIN (GLUCOPHAGE) 500 MG tablet Take 500 mg by mouth 2 (two) times daily with a meal.    Historical Provider, MD   BP 130/89 mmHg  Pulse 92  Temp(Src) 98.3 F (36.8 C) (Oral)  Resp 18  Ht 5\' 10"  (1.778 m)  Wt 228 lb (103.42 kg)  BMI 32.71 kg/m2  SpO2 95% Physical Exam  Constitutional: He is oriented  to person, place, and time. He appears well-developed and well-nourished. No distress.  HENT:  Head: Normocephalic.  Eyes: Conjunctivae and EOM are normal.  Cardiovascular: Normal rate and intact distal pulses.   Pulmonary/Chest: Effort normal and breath sounds normal. No stridor.  Abdominal: Soft. Bowel sounds are normal. He exhibits no distension and no mass. There is no tenderness. There is no rebound and no guarding.  Genitourinary:  GU exam a chaperoned by nurse:   Patient uncircumcised:  No testicular swelling, right scrotum is mildly tender to palpation, cremasteric reflex intact.  Musculoskeletal: Normal range of motion.  Neurological: He is alert and oriented to person, place, and time.  Psychiatric: He has a normal mood and affect.  Nursing note and vitals reviewed.   ED Course  Procedures (including critical care time) Labs Review Labs Reviewed - No data to display  Imaging Review No results found.   EKG Interpretation None      MDM   Final diagnoses:  Pain    Filed Vitals:   04/01/15 1428 04/01/15 1512 04/01/15 1618  BP: 130/89 118/76 123/72  Pulse: 92 87 96  Temp: 98.3 F (36.8 C)    TempSrc: Oral    Resp: 18 18 18   Height: 5\' 10"  (1.778 m)    Weight: 228 lb (103.42 kg)    SpO2: 95% 96% 95%    Avrohom L Epple is a pleasant 64 y.o. male presenting with acute severe onset of right  testicular pain this morning which spontaneously improved throughout the morning. Physical exam with no significant abnormalities. Patient is mildly tender to palpation is not significantly swollen. Urinalysis is without signs of infection. Abdominal exam is benign, ultrasound negative. This is a shared visit with the attending physician. Patient will follow with urology.  Evaluation does not show pathology that would require ongoing emergent intervention or inpatient treatment. Pt is hemodynamically stable and mentating appropriately. Discussed findings and plan with  patient/guardian, who agrees with care plan. All questions answered. Return precautions discussed and outpatient follow up given.    Monico Blitz, PA-C 04/01/15 1723  Tanna Furry, MD 04/09/15 [phone]

## 2015-05-05 ENCOUNTER — Encounter (HOSPITAL_BASED_OUTPATIENT_CLINIC_OR_DEPARTMENT_OTHER): Payer: Self-pay | Admitting: *Deleted

## 2015-05-05 ENCOUNTER — Emergency Department (HOSPITAL_BASED_OUTPATIENT_CLINIC_OR_DEPARTMENT_OTHER)
Admission: EM | Admit: 2015-05-05 | Discharge: 2015-05-05 | Disposition: A | Discharge status: Home / Self Care | Payer: 59 | Attending: Emergency Medicine | Admitting: Emergency Medicine

## 2015-05-05 ENCOUNTER — Emergency Department (HOSPITAL_BASED_OUTPATIENT_CLINIC_OR_DEPARTMENT_OTHER): Payer: 59

## 2015-05-05 DIAGNOSIS — N410 Acute prostatitis: Secondary | ICD-10-CM | POA: Diagnosis not present

## 2015-05-05 DIAGNOSIS — Z9889 Other specified postprocedural states: Secondary | ICD-10-CM | POA: Diagnosis not present

## 2015-05-05 DIAGNOSIS — E119 Type 2 diabetes mellitus without complications: Secondary | ICD-10-CM | POA: Diagnosis not present

## 2015-05-05 DIAGNOSIS — R1030 Lower abdominal pain, unspecified: Secondary | ICD-10-CM | POA: Diagnosis present

## 2015-05-05 DIAGNOSIS — K5900 Constipation, unspecified: Secondary | ICD-10-CM | POA: Diagnosis not present

## 2015-05-05 DIAGNOSIS — Z79899 Other long term (current) drug therapy: Secondary | ICD-10-CM | POA: Insufficient documentation

## 2015-05-05 HISTORY — DX: Other specified postprocedural states: Z98.890

## 2015-05-05 LAB — URINALYSIS, ROUTINE W REFLEX MICROSCOPIC
Bilirubin Urine: NEGATIVE
Glucose, UA: NEGATIVE mg/dL
Ketones, ur: 15 mg/dL — AB
NITRITE: NEGATIVE
Protein, ur: 100 mg/dL — AB
SPECIFIC GRAVITY, URINE: 1.026 (ref 1.005–1.030)
UROBILINOGEN UA: 1 mg/dL (ref 0.0–1.0)
pH: 5.5 (ref 5.0–8.0)

## 2015-05-05 LAB — CBC WITH DIFFERENTIAL/PLATELET
Basophils Absolute: 0 10*3/uL (ref 0.0–0.1)
Basophils Relative: 0 % (ref 0–1)
Eosinophils Absolute: 0 10*3/uL (ref 0.0–0.7)
Eosinophils Relative: 0 % (ref 0–5)
HCT: 41 % (ref 39.0–52.0)
Hemoglobin: 13.5 g/dL (ref 13.0–17.0)
Lymphocytes Relative: 11 % — ABNORMAL LOW (ref 12–46)
Lymphs Abs: 1.5 10*3/uL (ref 0.7–4.0)
MCH: 27.2 pg (ref 26.0–34.0)
MCHC: 32.9 g/dL (ref 30.0–36.0)
MCV: 82.5 fL (ref 78.0–100.0)
Monocytes Absolute: 0.7 10*3/uL (ref 0.1–1.0)
Monocytes Relative: 5 % (ref 3–12)
Neutro Abs: 11.4 10*3/uL — ABNORMAL HIGH (ref 1.7–7.7)
Neutrophils Relative %: 84 % — ABNORMAL HIGH (ref 43–77)
Platelets: 124 10*3/uL — ABNORMAL LOW (ref 150–400)
RBC: 4.97 MIL/uL (ref 4.22–5.81)
RDW: 13 % (ref 11.5–15.5)
WBC: 13.6 10*3/uL — ABNORMAL HIGH (ref 4.0–10.5)

## 2015-05-05 LAB — COMPREHENSIVE METABOLIC PANEL
ALT: 18 U/L (ref 17–63)
AST: 20 U/L (ref 15–41)
Albumin: 3.6 g/dL (ref 3.5–5.0)
Alkaline Phosphatase: 61 U/L (ref 38–126)
Anion gap: 10 (ref 5–15)
BUN: 20 mg/dL (ref 6–20)
CO2: 25 mmol/L (ref 22–32)
Calcium: 9 mg/dL (ref 8.9–10.3)
Chloride: 99 mmol/L — ABNORMAL LOW (ref 101–111)
Creatinine, Ser: 1.48 mg/dL — ABNORMAL HIGH (ref 0.61–1.24)
GFR calc Af Amer: 57 mL/min — ABNORMAL LOW (ref >60)
GFR calc non Af Amer: 49 mL/min — ABNORMAL LOW (ref >60)
Glucose, Bld: 152 mg/dL — ABNORMAL HIGH (ref 65–99)
Potassium: 3.9 mmol/L (ref 3.5–5.1)
Sodium: 134 mmol/L — ABNORMAL LOW (ref 135–145)
Total Bilirubin: 1 mg/dL (ref 0.3–1.2)
Total Protein: 7.1 g/dL (ref 6.5–8.1)

## 2015-05-05 LAB — URINE MICROSCOPIC-ADD ON

## 2015-05-05 LAB — LIPASE, BLOOD: Lipase: 16 U/L — ABNORMAL LOW (ref 22–51)

## 2015-05-05 LAB — CBG MONITORING, ED: Glucose-Capillary: 145 mg/dL — ABNORMAL HIGH (ref 65–99)

## 2015-05-05 MED ORDER — SODIUM CHLORIDE 0.9 % IV BOLUS (SEPSIS)
1000.0000 mL | Freq: Once | INTRAVENOUS | Status: AC
  Administered 2015-05-05: 1000 mL via INTRAVENOUS

## 2015-05-05 MED ORDER — CIPROFLOXACIN HCL 500 MG PO TABS: 500.0000 mg | ORAL_TABLET | Freq: Two times a day (BID) | ORAL | Status: DC

## 2015-05-05 MED ORDER — IOHEXOL 300 MG/ML  SOLN
25.0000 mL | Freq: Once | INTRAMUSCULAR | Status: AC | PRN
  Administered 2015-05-05: 25 mL via ORAL

## 2015-05-05 MED ORDER — ACETAMINOPHEN 325 MG PO TABS
975.0000 mg | ORAL_TABLET | Freq: Once | ORAL | Status: AC
  Administered 2015-05-05: 975 mg via ORAL
  Filled 2015-05-05: qty 3

## 2015-05-05 MED ORDER — IOHEXOL 300 MG/ML  SOLN
75.0000 mL | Freq: Once | INTRAMUSCULAR | Status: AC | PRN
  Administered 2015-05-05: 75 mL via INTRAVENOUS

## 2015-05-05 NOTE — ED Notes (Signed)
Vital signs stable. 

## 2015-05-05 NOTE — ED Notes (Signed)
Abdominal pain, bloating and feeling of constipation since yesterday. The day before he felt weak and had an episode of near syncope. States he just feels weak.

## 2015-05-05 NOTE — ED Provider Notes (Signed)
CSN: [id number]     Arrival date & time 05/05/15  1144 History   First MD Initiated Contact with Patient 05/05/15 1201     Chief Complaint  Patient presents with  . Abdominal Pain     (Consider location/radiation/quality/duration/timing/severity/associated sxs/prior Treatment) HPI  Nathan [name] is a 64 yo male with a PMH of DM who presents today with lower abdominal pain x 2 days. Patient states that 2 days ago he was eating a meatball sub for lunch and a couple hours later developed crampy abdominal pain in his lower abdomen that does not radiate. He has not taken anything for the pain and nothing seems to make it better or worse. Later that night he had an episode where he felt like he was going to pass out. He had lightheadedness, felt feverish, and had diaphoresis. He states this episode resolved after lying on the floor. He admits to constipation, he had a BM earlier today but he states that, "it wasn't much." He states that when he urinates he feels like he needs to have a BM. He endorses bloating. He admits to weakness. He denies recent fever, chills, N/V/D, CP, SOB, dysuria, increased urinary frequency, blood in the stool, edema.   Past Medical History  Diagnosis Date  . Diabetes mellitus without complication   . H/O cardiac catheterization    History reviewed. No pertinent past surgical history. No family history on file. Social History  Substance Use Topics  . Smoking status: Never Smoker   . Smokeless tobacco: Never Used  . Alcohol Use: No    Review of Systems  ROS negative unless otherwise specified by HPI.   Allergies  Review of patient's allergies indicates no known allergies.  Home Medications   Prior to Admission medications   Medication Sig Start Date End Date Taking? Authorizing Provider  SITagliptin Phosphate (JANUVIA PO) Take by mouth.   Yes Historical Provider, MD  metFORMIN (GLUCOPHAGE) 500 MG tablet Take 500 mg by mouth 2 (two) times daily with a meal.     Historical Provider, MD   BP 122/61 mmHg  Pulse 105  Temp(Src) 98.4 F (36.9 C) (Oral)  Resp 20  Ht 5\' 9"  (1.753 m)  Wt 228 lb (103.42 kg)  BMI 33.65 kg/m2  SpO2 95% Physical Exam  Constitutional: He is oriented to person, place, and time. He appears well-developed and well-nourished. No distress.  HENT:  Head: Normocephalic and atraumatic.  Eyes: Pupils are equal, round, and reactive to light.  Neck: Normal range of motion. Neck supple.  Cardiovascular: Normal rate, regular rhythm and normal heart sounds.  Exam reveals no gallop and no friction rub.   No murmur heard. Pulmonary/Chest: Effort normal and breath sounds normal. No respiratory distress.  Abdominal: Soft. Bowel sounds are normal. He exhibits no distension. There is no tenderness.  Genitourinary: Prostate is tender.     Neurological: He is alert and oriented to person, place, and time. He exhibits normal muscle tone. Coordination normal.  Skin: Skin is warm and dry. No erythema.  Psychiatric: He has a normal mood and affect. His behavior is normal.  Nursing note and vitals reviewed.   ED Course  Procedures (including critical care time) Labs Review Labs Reviewed  URINALYSIS, ROUTINE W REFLEX MICROSCOPIC (NOT AT [facility]) - Abnormal; Notable for the following:    Color, Urine AMBER (*)    Hgb urine dipstick MODERATE (*)    Ketones, ur 15 (*)    Protein, ur 100 (*)    Leukocytes,  UA TRACE (*)    All other components within normal limits  COMPREHENSIVE METABOLIC PANEL - Abnormal; Notable for the following:    Sodium 134 (*)    Chloride 99 (*)    Glucose, Bld 152 (*)    Creatinine, Ser 1.48 (*)    GFR calc non Af Amer 49 (*)    GFR calc Af Amer 57 (*)    All other components within normal limits  LIPASE, BLOOD - Abnormal; Notable for the following:    Lipase 16 (*)    All other components within normal limits  CBC WITH DIFFERENTIAL/PLATELET - Abnormal; Notable for the following:    WBC 13.6 (*)    Platelets  124 (*)    Neutrophils Relative % 84 (*)    Neutro Abs 11.4 (*)    Lymphocytes Relative 11 (*)    All other components within normal limits  URINE MICROSCOPIC-ADD ON - Abnormal; Notable for the following:    Bacteria, UA FEW (*)    All other components within normal limits  CBG MONITORING, ED - Abnormal; Notable for the following:    Glucose-Capillary 145 (*)    All other components within normal limits    Imaging Review Ct Abdomen Pelvis W Contrast  05/05/2015   CLINICAL DATA:  Abdominal pain, bloating, constipation for 1 day  EXAM: CT ABDOMEN AND PELVIS WITH CONTRAST  TECHNIQUE: Multidetector CT imaging of the abdomen and pelvis was performed using the standard protocol following bolus administration of intravenous contrast.  CONTRAST:  69mL OMNIPAQUE IOHEXOL 300 MG/ML SOLN, 70mL OMNIPAQUE IOHEXOL 300 MG/ML SOLN  COMPARISON:  05/18/2009  FINDINGS: Lower chest: Minimal dependent subpleural bibasilar atelectasis noted. No focal opacity.  Hepatobiliary: Hepatic hypodensity may indicate steatosis without focal abnormality. Gallbladder is unremarkable.  Pancreas: Normal  Spleen: Focal hyper enhancement measuring 5 mm image 21 is most compatible with a flash filling hemangioma or other benign finding.  Adrenals/Urinary Tract: Adrenal glands appear normal. Possible 1 mm nonobstructing left lower renal pole calculus image 39 and 41. Early excretion could appear similar. Too small to characterize 1.2 cm right mid renal cortical hypodense lesion image 37. No hydroureteronephrosis. Areas of probable renal cortical scarring or lobulation. No hydroureteronephrosis. No perinephric stranding.  Bladder is unremarkable. Mild impression upon the bladder base by prostatomegaly, 7.4 cm image 85.  Stomach/Bowel: No bowel wall thickening or focal segmental dilatation is identified. Appendix not visualized but no secondary evidence for acute appendicitis is seen.  Vascular/Lymphatic: No aortic aneurysm identified. No  lymphadenopathy.  Other: No free air or fluid.  Musculoskeletal: Disc degenerative change noted in the lumbar spine. Motion artifact at the L4-L5 interspace is noted, degrading imaging at that level.  IMPRESSION: No acute intra-abdominal or pelvic pathology.  Prostatomegaly.  Mild decrease in hepatic density which may indicate steatosis.   Electronically Signed   By: Conchita Paris M.D.   On: 05/05/2015 15:17   I have personally reviewed and evaluated these images and lab results as part of my medical decision-making.  5:00 PM the patient is feeling dramatically better following a large bowel movement that he had here in the emergency department.  Patient will be treated for prostatitis as he does have some discomfort on rectal examination of the prostate.  It is mild.  Patient has been given IV fluids here in the emergency department.  He has ambulated without difficulty  Dalia Heading, PA-C 05/05/15 1711  Serita Grit, MD 05/09/15 [phone]

## 2015-05-05 NOTE — Discharge Instructions (Signed)
Follow-up with her primary care doctor.  Return here as needed.  Increase your fluid intake

## 2015-05-27 ENCOUNTER — Encounter: Payer: 59 | Attending: Internal Medicine

## 2015-05-27 VITALS — Ht 71.0 in | Wt 221.0 lb

## 2015-05-27 DIAGNOSIS — E119 Type 2 diabetes mellitus without complications: Secondary | ICD-10-CM | POA: Diagnosis not present

## 2015-05-27 DIAGNOSIS — Z713 Dietary counseling and surveillance: Secondary | ICD-10-CM | POA: Diagnosis not present

## 2015-05-27 NOTE — Progress Notes (Signed)
Dispensed: True Result Glucometer Lot# [id number] Exp: 2016/06/25 Glucose: 103pp

## 2015-05-27 NOTE — Progress Notes (Signed)
Patient was seen on 05/27/15 for the complete diabetes self-management series at the [facility]. This is a part of the Link to [employer].  Handouts given during class include:  Living Well with Diabetes book  Carb Counting and Meal Planning book  Meal Plan Card  Carbohydrate guide  Meal planning worksheet  Low Sodium Flavoring Tips  The diabetes portion plate  Low Carbohydrate Snack Suggestions  A1c to eAG Conversion Chart  Diabetes Medications  Stress Management  Diabetes Recommended Care Schedule  Diabetes Success Plan  Core Class Satisfaction Survey  The following learning objectives were met by the patient during this course:  Describe diabetes  State some common risk factors for diabetes  Defines the role of glucose and insulin  Identifies type of diabetes and pathophysiology  Describe the relationship between diabetes and cardiovascular risk  State the members of the Healthcare Team  States the rationale for glucose monitoring  State when to test glucose  State their individual Target Range  State the importance of logging glucose readings  Describe how to interpret glucose readings  Identifies A1C target  Explain the correlation between A1c and eAG values  State symptoms and treatment of high blood glucose  State symptoms and treatment of low blood glucose  Explain proper technique for glucose testing  Identifies proper sharps disposal  Describe the role of different macronutrients on glucose  Explain how carbohydrates affect blood glucose  State what foods contain the most carbohydrates  Demonstrate carbohydrate counting  Demonstrate how to read Nutrition Facts food label  Describe effects of various fats on heart health  Describe the importance of good nutrition for health and healthy eating strategies  Describe techniques for managing your shopping, cooking and meal planning  List  strategies to follow meal plan when dining out  Describe the effects of alcohol on glucose and how to use it safely . State the amount of activity recommended for healthy living . Describe activities suitable for individual needs . Identify ways to regularly incorporate activity into daily life . Identify barriers to activity and ways to over come these barriers  Identify diabetes medications being personally used and their primary action for lowering glucose and possible side effects . Describe role of stress on blood glucose and develop strategies to address psychosocial issues . Identify diabetes complications and ways to prevent them  Explain how to manage diabetes during illness . Evaluate success in meeting personal goal . Establish 2-3 goals that they will plan to diligently work on until they return for the  38-monthfollow-up visit  Goals:   I will count my carb choices at most meals and snacks  I will be active more minutes a week  I will take my diabetes medications as scheduled  I will eat less unhealthy fats   I will test my glucose at least 1 times a day, 7 days a week  I will look at patterns in my record book at least 4 days a month  To help manage stress I will  exercise at least 3 times a week  Your patient has identified these potential barriers to change:  None  Your patient has identified their diabetes self-care support plan as  [facility] Family Support On-line Resources ADA Website Link to Wellness  [facility]

## 2015-06-23 ENCOUNTER — Other Ambulatory Visit: Payer: Self-pay

## 2015-06-23 VITALS — BP 112/78 | HR 85 | Resp 14 | Ht 72.0 in | Wt 218.6 lb

## 2015-06-23 DIAGNOSIS — E119 Type 2 diabetes mellitus without complications: Secondary | ICD-10-CM

## 2015-06-23 DIAGNOSIS — E785 Hyperlipidemia, unspecified: Secondary | ICD-10-CM | POA: Insufficient documentation

## 2015-06-23 LAB — POCT GLYCOSYLATED HEMOGLOBIN (HGB A1C): HEMOGLOBIN A1C: 6.4

## 2015-06-23 NOTE — Patient Instructions (Signed)
1. Plan to eat 45-60 GM (3-4) servings of carbohydrate a meal and 15 GM for snacks.  Plan to eat protein with your snacks 2. Plan to check blood sugar 3 times a week either fasting or 1 -2hrs after a meal.  Goals of 80-130 fasting and 180 or less after eating.  Plan to keep log and bring meter to next meeting 3. Plan to walk 2-3 times a week for 30 minutes. 4. Plan to ask MD for RX for testing strips and supplies 5. Plan to keep appointment with Dr.Cabeza on 06/30/15 6. Plan to return to Link to Wellness on 08/11/15 at 10 AM

## 2015-06-23 NOTE — Patient Outreach (Signed)
[facility]) Care Management   06/23/2015  Nathan [name] 03/24/1951 [id number]  Nathan [name] is an 64 y.o. male.  Member seen for initial office visit for Link to Wellness program for self management of Type 2 diabetes   Subjective: Member states that he had  Prediabetes for years but his labs showed that he now has diabetes.  States that his MD put him on Janumet for the last month.  States he has lost about 10 lbs since he started the medication.  States that he went to the diabetes class and he learned a lot.  States he is still trying to figure out what to eat and how much he should eat.  States he has not been checking his blood sugars very much.    Objective:   Review of Systems  All other systems reviewed and are negative. POC hemoglobin A1C 6.4  Physical Exam  Today's Vitals   06/23/15 1032  BP: 112/78  Pulse: 85  Resp: 14  Height: 1.829 m (6')  Weight: 218 lb 9.6 oz (99.156 kg)  SpO2: 96%  PainSc: 0-No pain    Current Medications:   Current Outpatient Prescriptions  Medication Sig Dispense Refill  . ciprofloxacin (CIPRO) 500 MG tablet Take 1 tablet (500 mg total) by mouth 2 (two) times daily. 28 tablet 0  . Lactobacillus (PROBIOTIC ACIDOPHILUS PO) Take 1 capsule by mouth daily.    . metFORMIN (GLUCOPHAGE) 500 MG tablet Take 500 mg by mouth 2 (two) times daily with a meal.    . rosuvastatin (CRESTOR) 20 MG tablet Take 20 mg by mouth daily.    . silodosin (RAPAFLO) 8 MG CAPS capsule Take 8 mg by mouth at bedtime.    Marland Kitchen SITagliptin Phosphate (JANUVIA PO) Take by mouth.    . sitaGLIPtin-metformin (JANUMET) 50-1000 MG tablet Take 1 tablet by mouth 2 (two) times daily with a meal.     No current facility-administered medications for this visit.    Functional Status:   In your present state of health, do you have any difficulty performing the following activities: 06/23/2015  Hearing? N  Vision? N  Difficulty concentrating or making decisions? N   Walking or climbing stairs? N  Dressing or bathing? N  Doing errands, shopping? N    Fall/Depression Screening:    PHQ 2/9 Scores 06/23/2015 05/27/2015  PHQ - 2 Score 0 0    Assessment:  Member seen for initial office visit for Link to Wellness program for self management of Type 2 diabetes.  Member has a new dx of Type 2 DM with hemoglobin A1C not at goal with reading of 8.1.  Member completed required diabetes self management educational class and pharmacy benefits activated.  Member is still learning CHO counting and portion control.  He needs reinforcement of nutritional instruction.  He reports only checking his blood sugar a few times since he took the class.  Member reports exercising 3 times a week  Plan:  1. Plan to eat 45-60 GM (3-4) servings of carbohydrate a meal and 15 GM for snacks.  Plan to eat protein with your snacks 2. Plan to check blood sugar 3 times a week either fasting or 1 -2hrs after a meal.  Goals of 80-130 fasting and 180 or less after eating.  Plan to keep log and bring meter to next meeting 3. Plan to walk 2-3 times a week for 30 minutes. 4. Plan to keep appointment with Dr.Cabeza on 06/30/15 5. Plan to  ask MD for RX for testing strips and supplies 6. Plan to return to Link to Wellness on 08/11/15 at 10 AM [facility] CM Care Plan Problem One        Most Recent Value   Care Plan Problem One  Elevated blood sugars related to dx of Type 2 DM    Role Documenting the Problem One  Care Management [place] for Problem One  Active   [facility]N Long Term Goal (31-90 days)  Member will decrease hemoglobin A1C to 7 or below in the next 90 days   [facility]N Long Term Goal Start Date  06/23/15   Interventions for Problem One Long Term Goal  Given Link to Wellness diabetes education packet, Reviewed program requirements, Instructed on CHO counting and portion control, Demonstrated portion sizes of CHO with model foods,Instructed on the disease process of diabetes and how it is a  progressive disease, Instructed to check blood sugars at least 3 times a week and instructed on blood sugar goals,Given log book and instructed to bring log and glucometer to next visit, Instructed on importance of regular exercise and glycemic control    Peter Garter RN, [facility] Care Management Coordinator-Link to [place] Management [phone]

## 2015-08-01 DIAGNOSIS — N400 Enlarged prostate without lower urinary tract symptoms: Secondary | ICD-10-CM | POA: Insufficient documentation

## 2015-08-01 DIAGNOSIS — N529 Male erectile dysfunction, unspecified: Secondary | ICD-10-CM | POA: Insufficient documentation

## 2015-08-01 DIAGNOSIS — R7989 Other specified abnormal findings of blood chemistry: Secondary | ICD-10-CM | POA: Insufficient documentation

## 2015-08-01 DIAGNOSIS — Z86010 Personal history of colonic polyps: Secondary | ICD-10-CM | POA: Insufficient documentation

## 2015-08-01 DIAGNOSIS — R748 Abnormal levels of other serum enzymes: Secondary | ICD-10-CM | POA: Insufficient documentation

## 2015-08-11 ENCOUNTER — Other Ambulatory Visit: Payer: Self-pay

## 2015-08-11 VITALS — BP 116/78 | HR 88 | Resp 16 | Ht 72.0 in | Wt 219.8 lb

## 2015-08-11 DIAGNOSIS — E119 Type 2 diabetes mellitus without complications: Secondary | ICD-10-CM

## 2015-08-11 NOTE — Patient Instructions (Signed)
1. Plan to eat 45-60 GM (3-4) servings of carbohydrate a meal and 15 GM for snacks.  Plan to eat protein with your snacks 2. Plan to check blood sugar 3 times a week either fasting or 1 -2hrs after a meal.  Goals of 80-130 fasting and 180 or less after eating.  Plan to keep log and bring meter to next meeting 3. Plan to walk 2-3 times a week for 30 minutes.  Plan to do weight training twice a week 4. Plan to see MD in February 5. Plan to return to Link to Wellness on 11/07/15 at 10 AM

## 2015-08-11 NOTE — Patient Outreach (Signed)
[facility]) Care Management   08/11/2015  CONSTANTINE RUDDICK 10-May-1951 [id number]  Nathan [name] is an 64 y.o. male.   Member seen for follow up office visit for Link to Wellness program for self management of Type 2 diabetes  Subjective: Member states that he went to see his primary care last month and his hemoglobin A1C was down to 6.2.  States he has been trying to watch his portion sizes.  States he is going to the gym 2-3 times a week.    Objective:   Review of Systems  All other systems reviewed and are negative.   Physical Exam Today's Vitals   08/11/15 1017  BP: 116/78  Pulse: 88  Resp: 16  Height: 1.829 m (6')  Weight: 219 lb 12.8 oz (99.701 kg)  SpO2: 97%  PainSc: 0-No pain   Current Medications:   Current Outpatient Prescriptions  Medication Sig Dispense Refill  . Multiple Vitamins-Minerals (CENTRUM SILVER ADULT 50+ PO) Take by mouth.    . rosuvastatin (CRESTOR) 20 MG tablet Take 20 mg by mouth daily.    . sitaGLIPtin-metformin (JANUMET) 50-1000 MG tablet Take 1 tablet by mouth 2 (two) times daily with a meal.    . ciprofloxacin (CIPRO) 500 MG tablet Take 1 tablet (500 mg total) by mouth 2 (two) times daily. (Patient not taking: Reported on 08/11/2015) 28 tablet 0  . Lactobacillus (PROBIOTIC ACIDOPHILUS PO) Take 1 capsule by mouth daily. Reported on 08/11/2015    . metFORMIN (GLUCOPHAGE) 500 MG tablet Take 500 mg by mouth 2 (two) times daily with a meal. Reported on 08/11/2015    . silodosin (RAPAFLO) 8 MG CAPS capsule Take 8 mg by mouth at bedtime. Reported on 08/11/2015    . SITagliptin Phosphate (JANUVIA PO) Take by mouth. Reported on 08/11/2015     No current facility-administered medications for this visit.    Functional Status:   In your present state of health, do you have any difficulty performing the following activities: 08/11/2015 06/23/2015  Hearing? N N  Vision? N N  Difficulty concentrating or making decisions? N N  Walking  or climbing stairs? N N  Dressing or bathing? N N  Doing errands, shopping? N N    Fall/Depression Screening:    PHQ 2/9 Scores 08/11/2015 06/23/2015 05/27/2015  PHQ - 2 Score 0 0 0    Assessment:   Member seen for follow up office visit for Link to Wellness program for self management of Type 2 diabetes.  Member is meeting diabetes self management goal of hemoglobin A1C of less than 7 with last reading 6.2%.  Member reports adhering to diet and exercise.  Denies any difficulties with medications.  Member up to date with annual eye exam and dental check ups.  Plan: Plan to eat 45-60 GM (3-4) servings of carbohydrate a meal and 15 GM for snacks.  Plan to eat protein with your snacks Plan to check blood sugar 3 times a week either fasting or 1 -2hrs after a meal.  Goals of 80-130 fasting and 180 or less after eating.  Plan to keep log and bring meter to next meeting Plan to walk 2-3 times a week for 30 minutes.  Plan to do weight training twice a week Plan to see MD in February   Plan to return to Link to Wellness on 11/07/15 at [place] Problem One        Most Recent Value   Care Plan  Problem One  Elevated blood sugars related to dx of Type 2 DM    Role Documenting the Problem One  Care Management [place] for Problem One  Not Active   THN Long Term Goal (31-90 days)  Member will decrease hemoglobin A1C to 7 or below in the next 90 days   THN Long Term Goal Start Date  06/23/15   [facility] Long Term Goal Met Date  08/11/15 [Hemoglobin A1C 6.2]    [facility] CM Care Plan Problem Two        Most Recent Value   Care Plan Problem Two  Potential for elevated blood sugars related to dx of Type 2 DM   Role Documenting the Problem Two  Care Management Coordinator   Care Plan for Problem Two  Active   Interventions for Problem Two Long Term Goal   Reviewed CHO counting and portion control, Reinforced inportance of regular exercise for glycemic control, Instructed on importance  of keeping lipid levels lower and the use of his statin to help improve his numbers,   THN Long Term Goal (31-90) days  Member will maintain hemoglobin A1C at or below 7   [facility] Long Term Goal Start Date  08/11/15    Peter Garter RN, [facility] Care Management Coordinator-Link to [place] Management [phone]

## 2015-09-18 MED FILL — JANUMET 50-1,000 MG TABLET: 50-1000 | 30 days supply | Qty: 60 | Fill #1

## 2015-10-09 DIAGNOSIS — E119 Type 2 diabetes mellitus without complications: Secondary | ICD-10-CM | POA: Diagnosis not present

## 2015-10-09 DIAGNOSIS — E785 Hyperlipidemia, unspecified: Secondary | ICD-10-CM | POA: Diagnosis not present

## 2015-11-03 MED FILL — JANUMET 50-1,000 MG TABLET: 50-1000 | 30 days supply | Qty: 60 | Fill #0 | Status: TO

## 2015-11-03 MED FILL — ROSUVASTATIN CALCIUM 20 MG: 20 | 90 days supply | Qty: 90 | Fill #0

## 2015-11-07 ENCOUNTER — Other Ambulatory Visit: Payer: Self-pay

## 2015-11-07 VITALS — BP 108/78 | HR 90 | Resp 16 | Ht 72.0 in | Wt 227.0 lb

## 2015-11-07 DIAGNOSIS — E119 Type 2 diabetes mellitus without complications: Secondary | ICD-10-CM

## 2015-11-07 NOTE — Patient Instructions (Signed)
1. Plan to eat 45-60 GM (3-4) servings of carbohydrate a meal and 15 GM for snacks.  Plan to eat protein with your snacks 2. Plan to check blood sugar weekly either fasting or 1 -2hrs after a meal.  Goals of 80-130 fasting and 180 or less after eating.  Plan to keep log and bring meter to next meeting 3. Plan to walk 2-3 times a week for 30 minutes.  Plan to do weight training twice a week 4. Plan to sign up for [employer] 5. Plan to return to Link to Wellness on 02/13/16 at 10 AM

## 2015-11-07 NOTE — Patient Outreach (Signed)
[facility]) Care Management   11/07/2015  Nathan [name] [birth year] [id number]  Nathan [name] is an 65 y.o. male.   Member seen for follow up office visit for Link to Wellness program for self management of Type 2 diabetes  Subjective:  States that he saw his MD last month and his hemoglobin A1C was 6.3.  States he is still trying to watch his portions and CHO intake.  States he goes to [employer] twice a week and walks for 30 minutes then rides bike for 20 minutes and then uses the weight machines.  States he is only checking his blood sugars about once a week and that they range from 102-132.  Objective:   Review of Systems  All other systems reviewed and are negative. POC CBG- 145 1 hour post prandial  Physical Exam Today's Vitals   11/07/15 1012  BP: 108/78  Pulse: 90  Resp: 16  Height: 1.829 m (6')  Weight: 227 lb (102.967 kg)  SpO2: 96%  PainSc: 0-No pain   Current Medications:   Current Outpatient Prescriptions  Medication Sig Dispense Refill  . Multiple Vitamins-Minerals (CENTRUM SILVER ADULT 50+ PO) Take by mouth.    . rosuvastatin (CRESTOR) 20 MG tablet Take 20 mg by mouth daily.    . sitaGLIPtin-metformin (JANUMET) 50-1000 MG tablet Take 1 tablet by mouth 2 (two) times daily with a meal.    . ciprofloxacin (CIPRO) 500 MG tablet Take 1 tablet (500 mg total) by mouth 2 (two) times daily. (Patient not taking: Reported on 08/11/2015) 28 tablet 0  . Lactobacillus (PROBIOTIC ACIDOPHILUS PO) Take 1 capsule by mouth daily. Reported on 11/07/2015    . metFORMIN (GLUCOPHAGE) 500 MG tablet Take 500 mg by mouth 2 (two) times daily with a meal. Reported on 11/07/2015    . silodosin (RAPAFLO) 8 MG CAPS capsule Take 8 mg by mouth at bedtime. Reported on 11/07/2015    . SITagliptin Phosphate (JANUVIA PO) Take by mouth. Reported on 11/07/2015     No current facility-administered medications for this visit.    Functional Status:   In your present state of  health, do you have any difficulty performing the following activities: 11/07/2015 08/11/2015  Hearing? N N  Vision? N N  Difficulty concentrating or making decisions? N N  Walking or climbing stairs? N N  Dressing or bathing? N N  Doing errands, shopping? N N    Fall/Depression Screening:    PHQ 2/9 Scores 11/07/2015 08/11/2015 06/23/2015 05/27/2015  PHQ - 2 Score 0 0 0 0    Assessment:  Member seen for follow up office visit for Link to Wellness program for self management of Type 2 diabetes. Member is meeting diabetes self management goal of hemoglobin A1C of less than 7 with last reading 6.3%. Member reports adhering to diet and exercise. Denies any difficulties with medications. Member up to date with annual eye exam and dental check ups  Plan:  Plan to eat 45-60 GM (3-4) servings of carbohydrate a meal and 15 GM for snacks.  Plan to eat protein with your snacks Plan to check blood sugar weekly either fasting or 1 -2hrs after a meal.  Goals of 80-130 fasting and 180 or less after eating.  Plan to keep log and bring meter to next meeting Plan to walk 2-3 times a week for 30 minutes.  Plan to do weight training twice a week Plan to sign up for Walker to return to Link to [employer]  on 02/13/16 at 10 AM  [facility] CM Care Plan Problem Two        Most Recent Value   Care Plan Problem Two  Potential for elevated blood sugars related to dx of Type 2 DM   Role Documenting the Problem Two  Care Management Coordinator   Care Plan for Problem Two  Active   Interventions for Problem Two Long Term Goal   Reviewed CHO counting and portion control, Reinforced inportance of regular exercise for glycemic control, Instructed on blood sugar goals, given written instructions on how to sign up for [employer] healthy rewards program and how to claim points for visit with RNCM   THN Long Term Goal (31-90) days  Member will maintain hemoglobin A1C at or below 7   [place] Term Goal Start Date   11/07/15 [Hemoglobin A1C 6.3%]    Peter Garter RN, [facility] Care Management Coordinator-Link to [place] Management [phone]

## 2015-12-21 MED FILL — JANUMET 50-1,000 MG TABLET: 50-1000 | 30 days supply | Qty: 60 | Fill #1 | Status: TO

## 2016-01-23 DIAGNOSIS — R972 Elevated prostate specific antigen [PSA]: Secondary | ICD-10-CM | POA: Diagnosis not present

## 2016-01-29 DIAGNOSIS — R972 Elevated prostate specific antigen [PSA]: Secondary | ICD-10-CM | POA: Diagnosis not present

## 2016-01-29 DIAGNOSIS — N400 Enlarged prostate without lower urinary tract symptoms: Secondary | ICD-10-CM | POA: Diagnosis not present

## 2016-02-05 MED FILL — JANUMET 50-1,000 MG TABLET: 50-1000 | 30 days supply | Qty: 60 | Fill #0 | Status: TO

## 2016-02-08 DIAGNOSIS — H5213 Myopia, bilateral: Secondary | ICD-10-CM | POA: Diagnosis not present

## 2016-02-08 DIAGNOSIS — H52223 Regular astigmatism, bilateral: Secondary | ICD-10-CM | POA: Diagnosis not present

## 2016-02-08 DIAGNOSIS — H524 Presbyopia: Secondary | ICD-10-CM | POA: Diagnosis not present

## 2016-02-13 ENCOUNTER — Ambulatory Visit: Payer: Self-pay

## 2016-02-14 ENCOUNTER — Other Ambulatory Visit: Payer: Self-pay

## 2016-02-14 VITALS — BP 114/64 | HR 98 | Resp 16 | Ht 72.0 in | Wt 226.2 lb

## 2016-02-14 DIAGNOSIS — E119 Type 2 diabetes mellitus without complications: Secondary | ICD-10-CM

## 2016-02-14 LAB — POCT GLYCOSYLATED HEMOGLOBIN (HGB A1C): Hemoglobin A1C: 6.3

## 2016-02-14 NOTE — Patient Outreach (Signed)
Nathan [name] [facility]) Care Management   02/14/2016  Nathan [name] 1951-02-07 [id number]  Nathan [name] is an 65 y.o. male.   Member seen for follow up office visit for Link to Wellness program for self management of Type 2 diabetes  Subjective: Member states that he has been trying to exercise regularly and eat smaller portions.  States he is going to the gym at [facility] 2-3 times a week and rides the exercise bike for 30 minutes.  States that he has lost a few lbs.  States he is to go see his MD for a physical in August.  States that he does not know if he needs to take his statin drug.  States he is checking his blood sugar 1-2 times a week and his readings range 71-147 fasting.    Objective:   Review of Systems  All other systems reviewed and are negative. POC HemoglobinA1C 6.3%  Physical Exam Today's Vitals   02/14/16 1009  BP: 114/64  Pulse: 98  Resp: 16  Height: 1.829 m (6')  Weight: 226 lb 3.2 oz (102.604 kg)  SpO2: 96%  PainSc: 0-No pain   Encounter Medications:   Outpatient Encounter Prescriptions as of 02/14/2016  Medication Sig  . Multiple Vitamins-Minerals (CENTRUM SILVER ADULT 50+ PO) Take by mouth.  . Omega 3-6-9 Fatty Acids (OMEGA 3-6-9 COMPLEX PO) Take 2 capsules by mouth daily.  . rosuvastatin (CRESTOR) 20 MG tablet Take 20 mg by mouth daily.  . sitaGLIPtin-metformin (JANUMET) 50-1000 MG tablet Take 1 tablet by mouth 2 (two) times daily with a meal.  . ciprofloxacin (CIPRO) 500 MG tablet Take 1 tablet (500 mg total) by mouth 2 (two) times daily. (Patient not taking: Reported on 08/11/2015)  . Lactobacillus (PROBIOTIC ACIDOPHILUS PO) Take 1 capsule by mouth daily. Reported on 02/14/2016  . metFORMIN (GLUCOPHAGE) 500 MG tablet Take 500 mg by mouth 2 (two) times daily with a meal. Reported on 02/14/2016  . silodosin (RAPAFLO) 8 MG CAPS capsule Take 8 mg by mouth at bedtime. Reported on 02/14/2016  . SITagliptin Phosphate (JANUVIA PO) Take by mouth. Reported on  02/14/2016   No facility-administered encounter medications on file as of 02/14/2016.    Functional Status:   In your present state of health, do you have any difficulty performing the following activities: 02/14/2016 11/07/2015  Hearing? N N  Vision? N N  Difficulty concentrating or making decisions? N N  Walking or climbing stairs? N N  Dressing or bathing? N N  Doing errands, shopping? - N    Fall/Depression Screening:    PHQ 2/9 Scores 02/14/2016 11/07/2015 08/11/2015 06/23/2015 05/27/2015  PHQ - 2 Score 0 0 0 0 0    Assessment:  Member seen for follow up office visit for Link to Wellness program for self management of Type 2 diabetes. Member is meeting diabetes self management goal of hemoglobin A1C of less than 7% with todays reading of 6.3%. Member reports adhering to diet and exercise. Denies any difficulties with medications. Member up to date with annual eye exam and dental check ups  Plan:  Plan to eat 45-60 GM (3-4) servings of carbohydrate a meal and 15 GM for snacks.  Plan to eat protein with your snacks Plan to check blood sugar weekly either fasting or 1 -2hrs after a meal.  Goals of 80-130 fasting and 180 or less after eating.  Plan to keep log and bring meter to next meeting Plan to walk or go to gym 2-3 times  a week for 30 minutes.  Plan to do weight training twice a week Plan to  see Primary care provider on 04/09/16 Plan to return to Link to Wellness on 05/21/16 at [place], [facility] Care Management Coordinator-Link to [place] Management [phone]

## 2016-02-14 NOTE — Patient Instructions (Addendum)
1. Plan to eat 45-60 GM (3-4) servings of carbohydrate a meal and 15 GM for snacks.  Plan to eat protein with your snacks 2. Plan to check blood sugar weekly either fasting or 1 -2hrs after a meal.  Goals of 80-130 fasting and 180 or less after eating.  Plan to keep log and bring meter to next meeting 3. Plan to walk or go to gym 2-3 times a week for 30 minutes.  Plan to do weight training twice a week 4. Plan to  see Primary care provider on 04/09/16 5. Plan to return to Link to Wellness on 05/21/16 at 10 AM

## 2016-02-23 ENCOUNTER — Ambulatory Visit (INDEPENDENT_AMBULATORY_CARE_PROVIDER_SITE_OTHER): Payer: 59 | Admitting: Family Medicine

## 2016-02-23 ENCOUNTER — Encounter: Payer: Self-pay | Admitting: Family Medicine

## 2016-02-23 VITALS — BP 121/80 | HR 81 | Ht 72.0 in | Wt 227.0 lb

## 2016-02-23 DIAGNOSIS — M25561 Pain in right knee: Secondary | ICD-10-CM

## 2016-02-23 DIAGNOSIS — R079 Chest pain, unspecified: Secondary | ICD-10-CM | POA: Diagnosis not present

## 2016-02-23 DIAGNOSIS — M25562 Pain in left knee: Secondary | ICD-10-CM

## 2016-02-23 DIAGNOSIS — M47814 Spondylosis without myelopathy or radiculopathy, thoracic region: Secondary | ICD-10-CM | POA: Diagnosis not present

## 2016-02-23 NOTE — Progress Notes (Signed)
PCP: CABEZA,[name], MD  Subjective:   HPI: Patient is a 65 y.o. male here for bilateral knee pain.  Patient reports for several months he has had right > left knee pain. Pain primarily anterior but in right knee radiates posteriorly with throbbing here. Pain 0/10 at rest, up to 8/10 on right, 4/10 on left. Worse when trying to get up from sitting. Aches at night. No swelling. Had x-rays showing DJD about a year and a half ago. No skin changes, numbness.  Past Medical History  Diagnosis Date  . Diabetes mellitus without complication ([place])   . H/O cardiac catheterization   . Hyperlipidemia     Current Outpatient Prescriptions on File Prior to Visit  Medication Sig Dispense Refill  . Lactobacillus (PROBIOTIC ACIDOPHILUS PO) Take 1 capsule by mouth daily. Reported on 02/14/2016    . Multiple Vitamins-Minerals (CENTRUM SILVER ADULT 50+ PO) Take by mouth.    . Omega 3-6-9 Fatty Acids (OMEGA 3-6-9 COMPLEX PO) Take 2 capsules by mouth daily.    . rosuvastatin (CRESTOR) 20 MG tablet Take 20 mg by mouth daily.    . silodosin (RAPAFLO) 8 MG CAPS capsule Take 8 mg by mouth at bedtime. Reported on 02/14/2016    . sitaGLIPtin-metformin (JANUMET) 50-1000 MG tablet Take 1 tablet by mouth 2 (two) times daily with a meal.     No current facility-administered medications on file prior to visit.    No past surgical history on file.  No Known Allergies  Social History   Social History  . Marital Status: Married    Spouse Name: N/A  . Number of Children: N/A  . Years of Education: N/A   Occupational History  . Not on file.   Social History Main Topics  . Smoking status: Never Smoker   . Smokeless tobacco: Never Used  . Alcohol Use: No  . Drug Use: Not on file  . Sexual Activity: Not on file   Other Topics Concern  . Not on file   Social History Narrative    Family History  Problem Relation Age of Onset  . Cancer Mother     BP 121/80 mmHg  Pulse 81  Ht 6' (1.829 m)  Wt 227  lb (102.967 kg)  BMI 30.78 kg/m2  Review of Systems: See HPI above.    Objective:  Physical Exam:  Gen: NAD, comfortable in exam room  Bilateral knees: No gross deformity, ecchymoses, effusion. No TTP. FROM. Negative ant/post drawers. Negative valgus/varus testing. Negative lachmanns. Negative mcmurrays, apleys, patellar apprehension. NV intact distally.    MSK u/s right knee: No effusion, bakers cyst.  Venous structures compressible posteriorly.    Assessment & Plan:  1. 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  Exam, history otherwise reassuring.  Discussed tylenol, nsaids, glucosamine, topical medications.  Consider injection, viscosupplementation, physical therapy.  Shown home exercises to do daily.  Heat/ice.  F/u prn.

## 2016-02-23 NOTE — Assessment & Plan Note (Signed)
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  Exam, history otherwise reassuring.  Discussed tylenol, nsaids, glucosamine, topical medications.  Consider injection, viscosupplementation, physical therapy.  Shown home exercises to do daily.  Heat/ice.  F/u prn.

## 2016-02-23 NOTE — Patient Instructions (Signed)
Your knee pain is due to arthritis. These are the 4 classes of medicine you can use for this: Tylenol 500mg  1-2 tabs three times a day for pain. Aleve 1-2 tabs twice a day with food Glucosamine sulfate 750mg  twice a day is a supplement that may help. Capsaicin, aspercreme, or biofreeze topically up to four times a day may also help with pain. Cortisone injections are an option. If cortisone injections do not help, there are different types of shots that may help but they take longer to take effect. It's important that you continue to stay active. Straight leg raises, knee extensions 3 sets of 10 once a day (add ankle weight if these become too easy). Consider physical therapy to strengthen muscles around the joint that hurts to take pressure off of the joint itself. Shoe inserts with good arch support may be helpful. Walker or cane if needed. Heat or ice 15 minutes at a time 3-4 times a day as needed to help with pain. Water aerobics and cycling with low resistance are the best two types of exercise for arthritis. Let me know if you want to do any of the above in the future (therapy, injections). Follow up with me as needed.

## 2016-03-12 ENCOUNTER — Encounter: Payer: Self-pay | Admitting: Skilled Nursing Facility1

## 2016-03-12 ENCOUNTER — Encounter: Payer: 59 | Attending: Internal Medicine | Admitting: Skilled Nursing Facility1

## 2016-03-12 VITALS — Ht 72.0 in | Wt 227.0 lb

## 2016-03-12 DIAGNOSIS — E669 Obesity, unspecified: Secondary | ICD-10-CM

## 2016-03-12 DIAGNOSIS — Z713 Dietary counseling and surveillance: Secondary | ICD-10-CM | POA: Diagnosis not present

## 2016-03-12 NOTE — Progress Notes (Signed)
  Medical Nutrition Therapy:  Appt start time: 1600 end time:  1630.   Assessment:  Primary concerns today: [employer] employee. Pt states he wants to know how to eat healthily. Pt states he is not a sweet eater. Pt states he wants a non-exotic meal plan. Pt staets he wants to be 200 pounds.  Pt states he wasted his time coming here because he already got the information. When dietitian asked if there was anything he would like to work on pt stated he does not anymore help. Pt states he only came for the badge.   Preferred Learning Style:   No preference indicated   Learning Readiness:   Contemplating  MEDICATIONS: See List   DIETARY INTAKE:  Usual eating pattern includes 3 meals and 2 snacks per day.  Everyday foods include none stated.  Avoided foods include none stated  24-hr recall:  B ( AM): bologna sandwich Snk ( AM): bugals L ( PM): peanut butter and jelly sandwich Snk ( PM):  D ( PM): fried chicken, vegetables Snk ( PM):  Beverages: lemonade, water  Usual physical activity: 3 times a week for 30 minutes of walking    Intervention:  Nutrition counseling. Dietitian attempted to educate pt on healthy habits and meals.  Teaching Method Utilized:  Visual Auditory Hands on  Handouts given during visit include:  MyPlate  Snack sheet  Barriers to learning/adherence to lifestyle change: none identified  Demonstrated degree of understanding via:  Teach Back   Monitoring/Evaluation:  Dietary intake, exercise, A1Cnd body weight prn.

## 2016-03-20 DIAGNOSIS — R079 Chest pain, unspecified: Secondary | ICD-10-CM | POA: Diagnosis not present

## 2016-04-04 MED FILL — JANUMET 50-1,000 MG TABLET: 50-1000 | 30 days supply | Qty: 60 | Fill #1 | Status: TO

## 2016-05-01 DIAGNOSIS — E119 Type 2 diabetes mellitus without complications: Secondary | ICD-10-CM | POA: Diagnosis not present

## 2016-05-01 DIAGNOSIS — E785 Hyperlipidemia, unspecified: Secondary | ICD-10-CM | POA: Diagnosis not present

## 2016-05-13 MED FILL — JANUMET 50-1,000 MG TABLET: 50-1000 | 30 days supply | Qty: 60 | Fill #0

## 2016-05-13 MED FILL — ROSUVASTATIN CALCIUM 20 MG: 20 | 30 days supply | Qty: 30 | Fill #0

## 2016-05-21 ENCOUNTER — Other Ambulatory Visit: Payer: Self-pay

## 2016-05-21 VITALS — BP 132/86 | HR 100 | Resp 14 | Ht 72.0 in | Wt 230.8 lb

## 2016-05-21 DIAGNOSIS — E119 Type 2 diabetes mellitus without complications: Secondary | ICD-10-CM

## 2016-05-21 NOTE — Patient Instructions (Signed)
1. Plan to eat 45-60 GM (3-4) servings of carbohydrate a meal and 15 GM for snacks.  Plan to eat protein with your snacks 2. Plan to check blood sugar weekly either fasting or 1 -2hrs after a meal.  Goals of 80-130 fasting and 180 or less after eating.  Plan to keep log and bring meter to next meeting 3. Plan to walk or go to gym 2-3 times a week for 30 minutes.  Plan to do weight training twice a week 4. Plan to  see Primary care provider in December 5. Plan to return to Link to Wellness on 09/03/16 at 10:30AM

## 2016-05-21 NOTE — Patient Outreach (Signed)
[facility]) Care Management   05/21/2016  Nathan [name] [birth year] [id number]  DRAGO MCKENDRICK is an 65 y.o. male.   Member seen for follow up office visit for Link to Wellness program for self management of Type 2 diabetes  Subjective: Member states that he had his physical earlier this month and his lab work was good.  States that his hemoglobin A1C was 6.4%.  States that his MD wants him to take an aspirin every day but he is unsure about why he need to take it.  States that he is trying to eat more regularly and to watch his portions.  States he is only checking his blood sugars every few days but they have been ranging 118-137.  States he is getting to the gym once a week for 30 minutes.  States he has been having some hip pain after lifting his wife.  States that her MS is progressing and she is needing more assistance with transfers.  States he has increased the amount of care giving for her.    Objective:   Review of Systems  Musculoskeletal: Positive for joint pain.    Physical Exam Today's Vitals   05/21/16 1041 05/21/16 1052  BP: 132/86   Pulse: 100   Resp: 14   SpO2: 96%   Weight: 230 lb 12.8 oz (104.7 kg)   Height: 1.829 m (6')   PainSc: 4  4    Encounter Medications:   Outpatient Encounter Prescriptions as of 05/21/2016  Medication Sig Note  . aspirin EC 81 MG tablet Take by mouth. 02/23/2016: [facility]ceived from: [facility]  . Lancets MISC  02/23/2016: Received from: [facility]  . Multiple Vitamins-Minerals (CENTRUM SILVER ADULT 50+ PO) Take by mouth.   . Omega 3-6-9 Fatty Acids (OMEGA 3-6-9 COMPLEX PO) Take 2 capsules by mouth daily.   . rosuvastatin (CRESTOR) 20 MG tablet Take 20 mg by mouth daily.   . sitaGLIPtin-metformin (JANUMET) 50-1000 MG tablet Take 1 tablet by mouth 2 (two) times daily with a meal.   . Lactobacillus (PROBIOTIC ACIDOPHILUS PO) Take 1 capsule by mouth daily. Reported on 02/14/2016    . silodosin (RAPAFLO) 8 MG CAPS capsule Take 8 mg by mouth at bedtime. Reported on 02/14/2016    No facility-administered encounter medications on file as of 05/21/2016.     Functional Status:   In your present state of health, do you have any difficulty performing the following activities: 05/21/2016 02/14/2016  Hearing? N N  Vision? N N  Difficulty concentrating or making decisions? N N  Walking or climbing stairs? N N  Dressing or bathing? N N  Doing errands, shopping? N -  Some recent data might be hidden    Fall/Depression Screening:    PHQ 2/9 Scores 05/21/2016 03/12/2016 02/14/2016 11/07/2015 08/11/2015 06/23/2015 05/27/2015  PHQ - 2 Score 0 0 0 0 0 0 0    Assessment:  Member seen for follow up office visit for Link to Wellness program for self management of Type 2 diabetes. Member is meeting diabetes self management goal of hemoglobin A1C of less than 7% with last reading of 6.4%. Member reports adhering to diet and exercise. Denies any difficulties with medications. Member up to date with annual eye exam and dental check ups.  Member having some care giver stress with his wife.   Plan:  Plan to eat 45-60 GM (3-4) servings of carbohydrate a meal and 15 GM for snacks.  Plan to eat protein with your snacks Plan to check blood sugar weekly either fasting or 1 -2hrs after a meal.  Goals of 80-130 fasting and 180 or less after eating.  Plan to keep log and bring meter to next meeting Plan to walk or go to gym 2-3 times a week for 30 minutes.  Plan to do weight training twice a week Plan to  see Primary care provider in December Plan to return to Link to Wellness on 09/03/16 at 10:30AM    [facility] CM Care Plan Problem Two   Flowsheet Row Most Recent Value  Care Plan Problem Two  Potential for elevated blood sugars related to dx of Type 2 DM  Role Documenting the Problem Two  Care Management Coordinator  Care Plan for Problem Two  Active  Interventions for Problem Two Long Term Goal    Reviewed CHO counting and portion control, Reinforced inportance of regular exercise for glycemic control, Instructed on the reasons for taking a low dose aspirin to help provent stroke and heart attack, Reinforced the importance of keeping cholesterol and lipids lower with diabetes to help prevent cardiovascular complications, Encouraged to continue to take statin,  Reinforced on blood sugar goals, Discussed community resources for his wife to help him with the stress of caregiving    [facility]N Long Term Goal (31-90) days  Member will maintain hemoglobin A1C at or below 7  [place] Term Goal Start Date  05/21/16 [Continue last Hemoglobin A1C 6.4%]    Peter Garter RN, [facility] Care Management Coordinator-Link to [place] Management [phone]

## 2016-06-24 ENCOUNTER — Ambulatory Visit (INDEPENDENT_AMBULATORY_CARE_PROVIDER_SITE_OTHER): Payer: 59 | Admitting: Family Medicine

## 2016-06-24 ENCOUNTER — Ambulatory Visit (HOSPITAL_BASED_OUTPATIENT_CLINIC_OR_DEPARTMENT_OTHER)
Admission: RE | Admit: 2016-06-24 | Discharge: 2016-06-24 | Disposition: A | Discharge status: Home / Self Care | Payer: 59 | Source: Ambulatory Visit | Attending: Family Medicine | Admitting: Family Medicine

## 2016-06-24 ENCOUNTER — Encounter: Payer: Self-pay | Admitting: Family Medicine

## 2016-06-24 VITALS — BP 122/83 | HR 88 | Ht 72.0 in | Wt 226.0 lb

## 2016-06-24 DIAGNOSIS — M25551 Pain in right hip: Secondary | ICD-10-CM

## 2016-06-24 MED FILL — JANUMET 50-1,000 MG TABLET: 50-1000 | 30 days supply | Qty: 60 | Fill #1

## 2016-06-24 NOTE — Patient Instructions (Signed)
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 Avoid painful activities as much as possible. Ice (or heat if this feels better) over area of pain 3-4 times a day for 15 minutes at a time Consider physical therapy or chiropractic care for this. Stretches - pick 2 and hold for 20-30 seconds x 3 - do once or twice a day. Hip side raise exercise 3 sets of 10-15 once a day - add weights if this becomes too easy. Tylenol and/or aleve as needed for pain.

## 2016-06-25 DIAGNOSIS — M25551 Pain in right hip: Secondary | ICD-10-CM | POA: Insufficient documentation

## 2016-06-25 NOTE — Progress Notes (Signed)
PCP: CABEZA,[name], MD  Subjective:   HPI: Patient is a 65 y.o. male here for right hip pain.  Patient reports for about 1 month he has had posterior right hip pain. Thinks it may have hurt after sleeping wrong but no injury. Tried naproxen. No radiation into leg. No numbness or tingling. No skin changes. Pain worse with walking, sitting. Pain level 5/10, sharp.  Past Medical History:  Diagnosis Date  . Diabetes mellitus without complication ([place])   . H/O cardiac catheterization   . Hyperlipidemia     Current Outpatient Prescriptions on File Prior to Visit  Medication Sig Dispense Refill  . aspirin EC 81 MG tablet Take by mouth.    . Lactobacillus (PROBIOTIC ACIDOPHILUS PO) Take 1 capsule by mouth daily. Reported on 02/14/2016    . Lancets MISC     . Multiple Vitamins-Minerals (CENTRUM SILVER ADULT 50+ PO) Take by mouth.    . Omega 3-6-9 Fatty Acids (OMEGA 3-6-9 COMPLEX PO) Take 2 capsules by mouth daily.    . rosuvastatin (CRESTOR) 20 MG tablet Take 20 mg by mouth daily.    . silodosin (RAPAFLO) 8 MG CAPS capsule Take 8 mg by mouth at bedtime. Reported on 02/14/2016    . sitaGLIPtin-metformin (JANUMET) 50-1000 MG tablet Take 1 tablet by mouth 2 (two) times daily with a meal.     No current facility-administered medications on file prior to visit.     No past surgical history on file.  No Known Allergies  Social History   Social History  . Marital status: Married    Spouse name: N/A  . Number of children: N/A  . Years of education: N/A   Occupational History  . Not on file.   Social History Main Topics  . Smoking status: Never Smoker  . Smokeless tobacco: Never Used  . Alcohol use No  . Drug use: Unknown  . Sexual activity: Not on file   Other Topics Concern  . Not on file   Social History Narrative  . No narrative on file    Family History  Problem Relation Age of Onset  . Cancer Mother     BP 122/83   Pulse 88   Ht 6' (1.829 m)   Wt 226 lb  (102.5 kg)   BMI 30.65 kg/m   Review of Systems: See HPI above.    Objective:  Physical Exam:  Gen: NAD, comfortable in exam room  Back/Right hip: No gross deformity, swelling, bruising. TTP right SI joint.  No other tenderness of hip, back. FROM. Strength LEs 5/5 all muscle groups.   2+ MSRs in patellar and achilles tendons, equal bilaterally. Negative SLRs. Sensation intact to light touch bilaterally. Negative logroll bilateral hips Mild positive right fabers, negative left.  Negative piriformis stretches.  Assessment & Plan:  1. Right hip/back pain - independently reviewed radiographs and no evidence fracture.  No evidence arthritis as well.  2/2 SI joint dysfunction.  Discussed ice/heat.  Tylenol and/or aleve as needed.  Shown home exercises and stretches to do daily.  Consider physical therapy, chiropractic care if not improving.  F/u in 5-6 weeks.

## 2016-06-25 NOTE — Assessment & Plan Note (Signed)
independently reviewed radiographs and no evidence fracture.  No evidence arthritis as well.  2/2 SI joint dysfunction.  Discussed ice/heat.  Tylenol and/or aleve as needed.  Shown home exercises and stretches to do daily.  Consider physical therapy, chiropractic care if not improving.  F/u in 5-6 weeks.

## 2016-07-22 MED FILL — JANUMET 50-1,000 MG TABLET: 50-1000 | 30 days supply | Qty: 60 | Fill #2

## 2016-07-29 DIAGNOSIS — R972 Elevated prostate specific antigen [PSA]: Secondary | ICD-10-CM | POA: Diagnosis not present

## 2016-08-05 DIAGNOSIS — R972 Elevated prostate specific antigen [PSA]: Secondary | ICD-10-CM | POA: Diagnosis not present

## 2016-09-02 DIAGNOSIS — E782 Mixed hyperlipidemia: Secondary | ICD-10-CM | POA: Diagnosis not present

## 2016-09-02 DIAGNOSIS — E119 Type 2 diabetes mellitus without complications: Secondary | ICD-10-CM | POA: Diagnosis not present

## 2016-09-02 DIAGNOSIS — Z6831 Body mass index (BMI) 31.0-31.9, adult: Secondary | ICD-10-CM | POA: Diagnosis not present

## 2016-09-02 DIAGNOSIS — Z0000 Encounter for general adult medical examination without abnormal findings: Secondary | ICD-10-CM | POA: Diagnosis not present

## 2016-09-02 DIAGNOSIS — Z7984 Long term (current) use of oral hypoglycemic drugs: Secondary | ICD-10-CM | POA: Diagnosis not present

## 2016-09-02 DIAGNOSIS — E669 Obesity, unspecified: Secondary | ICD-10-CM | POA: Diagnosis not present

## 2016-09-02 DIAGNOSIS — Z23 Encounter for immunization: Secondary | ICD-10-CM | POA: Diagnosis not present

## 2016-09-03 ENCOUNTER — Ambulatory Visit: Payer: Self-pay

## 2016-09-03 MED FILL — JANUMET 50-1,000 MG TABLET: 50-1000 | 30 days supply | Qty: 60 | Fill #0

## 2016-10-07 DIAGNOSIS — D72819 Decreased white blood cell count, unspecified: Secondary | ICD-10-CM | POA: Diagnosis not present

## 2016-10-23 MED FILL — JANUMET 50-1,000 MG TABLET: 50-1000 | 30 days supply | Qty: 60 | Fill #1

## 2016-11-14 MED FILL — ACCU-CHEK FASTCLIX LANCETS: 90 days supply | Qty: 102 | Fill #0

## 2016-11-14 MED FILL — ACCU-CHEK GUIDE TEST STRIP: 90 days supply | Qty: 100 | Fill #0

## 2016-12-04 DIAGNOSIS — E785 Hyperlipidemia, unspecified: Secondary | ICD-10-CM | POA: Diagnosis not present

## 2016-12-06 MED FILL — JANUMET 50-1,000 MG TABLET: 50-1000 | 30 days supply | Qty: 60 | Fill #2

## 2016-12-17 MED FILL — PENICILLIN VK 500 MG TABLET: 500 | 7 days supply | Qty: 29 | Fill #0

## 2016-12-17 MED FILL — IBUPROFEN 800 MG TABLET: 800 | 4 days supply | Qty: 15 | Fill #0

## 2016-12-30 DIAGNOSIS — E119 Type 2 diabetes mellitus without complications: Secondary | ICD-10-CM | POA: Diagnosis not present

## 2016-12-30 DIAGNOSIS — Z23 Encounter for immunization: Secondary | ICD-10-CM | POA: Diagnosis not present

## 2016-12-30 DIAGNOSIS — E669 Obesity, unspecified: Secondary | ICD-10-CM | POA: Insufficient documentation

## 2016-12-30 DIAGNOSIS — Z1159 Encounter for screening for other viral diseases: Secondary | ICD-10-CM | POA: Diagnosis not present

## 2016-12-30 DIAGNOSIS — E785 Hyperlipidemia, unspecified: Secondary | ICD-10-CM | POA: Diagnosis not present

## 2017-01-21 MED FILL — JANUMET 50-1,000 MG TABLET: 50-1000 | 30 days supply | Qty: 60 | Fill #3

## 2017-01-21 MED FILL — ROSUVASTATIN CALCIUM 20 MG: 20 | 30 days supply | Qty: 30 | Fill #1

## 2017-01-30 DIAGNOSIS — R972 Elevated prostate specific antigen [PSA]: Secondary | ICD-10-CM | POA: Diagnosis not present

## 2017-02-10 DIAGNOSIS — R972 Elevated prostate specific antigen [PSA]: Secondary | ICD-10-CM | POA: Diagnosis not present

## 2017-02-10 DIAGNOSIS — N5201 Erectile dysfunction due to arterial insufficiency: Secondary | ICD-10-CM | POA: Diagnosis not present

## 2017-03-21 DIAGNOSIS — H2513 Age-related nuclear cataract, bilateral: Secondary | ICD-10-CM | POA: Diagnosis not present

## 2017-03-21 DIAGNOSIS — E119 Type 2 diabetes mellitus without complications: Secondary | ICD-10-CM | POA: Diagnosis not present

## 2017-03-21 DIAGNOSIS — H524 Presbyopia: Secondary | ICD-10-CM | POA: Diagnosis not present

## 2017-03-21 DIAGNOSIS — H5213 Myopia, bilateral: Secondary | ICD-10-CM | POA: Diagnosis not present

## 2017-03-21 DIAGNOSIS — H52223 Regular astigmatism, bilateral: Secondary | ICD-10-CM | POA: Diagnosis not present

## 2017-03-24 MED FILL — JANUMET 50-1,000 MG TABLET: 50-1000 | 30 days supply | Qty: 60 | Fill #4

## 2017-05-06 MED FILL — JANUMET 50-1,000 MG TABLET: 50-1000 | 30 days supply | Qty: 60 | Fill #5

## 2017-06-11 DIAGNOSIS — E669 Obesity, unspecified: Secondary | ICD-10-CM | POA: Diagnosis not present

## 2017-06-11 DIAGNOSIS — E119 Type 2 diabetes mellitus without complications: Secondary | ICD-10-CM | POA: Diagnosis not present

## 2017-06-11 DIAGNOSIS — E785 Hyperlipidemia, unspecified: Secondary | ICD-10-CM | POA: Diagnosis not present

## 2017-06-24 MED FILL — JANUMET 50-1,000 MG TABLET: 50-1000 | 30 days supply | Qty: 60 | Fill #6

## 2017-08-08 ENCOUNTER — Other Ambulatory Visit: Payer: Self-pay

## 2017-08-08 NOTE — Patient Outreach (Signed)
[facility]) Care Management  08/08/2017  Nathan [name] 02/18/1951 [id number]   Case closed in [place].  Member is being followed by the [employer] program Member enrolled in an external program. Peter Garter RN, [facility] Care Management Coordinator-Link to [place] Management [phone]

## 2017-08-14 MED FILL — JANUMET 50-1,000 MG TABLET: 50-1000 | 30 days supply | Qty: 60 | Fill #7

## 2017-08-27 DIAGNOSIS — R972 Elevated prostate specific antigen [PSA]: Secondary | ICD-10-CM | POA: Diagnosis not present

## 2017-09-01 DIAGNOSIS — N451 Epididymitis: Secondary | ICD-10-CM | POA: Diagnosis not present

## 2017-09-01 DIAGNOSIS — N5201 Erectile dysfunction due to arterial insufficiency: Secondary | ICD-10-CM | POA: Diagnosis not present

## 2017-09-01 DIAGNOSIS — R972 Elevated prostate specific antigen [PSA]: Secondary | ICD-10-CM | POA: Diagnosis not present

## 2017-09-01 MED FILL — MELOXICAM 15 MG TABLET: 15 | 30 days supply | Qty: 30 | Fill #0

## 2017-09-01 MED FILL — SULFAMETHOXAZOLE-TMP DS TAB: 800-160 | 10 days supply | Qty: 20 | Fill #0

## 2017-09-19 MED FILL — JANUMET 50-1,000 MG TABLET: 50-1000 | 30 days supply | Qty: 60 | Fill #0

## 2017-09-23 MED FILL — SILDENAFIL 20 MG TABLET: 20 | 30 days supply | Qty: 50 | Fill #0

## 2017-09-29 DIAGNOSIS — E119 Type 2 diabetes mellitus without complications: Secondary | ICD-10-CM | POA: Diagnosis not present

## 2017-09-29 DIAGNOSIS — E785 Hyperlipidemia, unspecified: Secondary | ICD-10-CM | POA: Diagnosis not present

## 2017-09-29 DIAGNOSIS — Z0000 Encounter for general adult medical examination without abnormal findings: Secondary | ICD-10-CM | POA: Diagnosis not present

## 2017-09-29 DIAGNOSIS — E669 Obesity, unspecified: Secondary | ICD-10-CM | POA: Diagnosis not present

## 2017-10-04 DIAGNOSIS — E119 Type 2 diabetes mellitus without complications: Secondary | ICD-10-CM | POA: Diagnosis not present

## 2017-10-04 DIAGNOSIS — Z0000 Encounter for general adult medical examination without abnormal findings: Secondary | ICD-10-CM | POA: Diagnosis not present

## 2017-10-13 DIAGNOSIS — R972 Elevated prostate specific antigen [PSA]: Secondary | ICD-10-CM | POA: Diagnosis not present

## 2017-11-06 MED FILL — JANUMET 50-1,000 MG TABLET: 50-1000 | 30 days supply | Qty: 60 | Fill #1

## 2017-11-06 MED FILL — ROSUVASTATIN CALCIUM 20 MG: 20 | 30 days supply | Qty: 30 | Fill #0

## 2017-12-01 ENCOUNTER — Other Ambulatory Visit: Payer: Self-pay | Admitting: Urology

## 2017-12-01 DIAGNOSIS — R972 Elevated prostate specific antigen [PSA]: Secondary | ICD-10-CM

## 2017-12-23 ENCOUNTER — Ambulatory Visit
Admission: RE | Admit: 2017-12-23 | Discharge: 2017-12-23 | Disposition: A | Discharge status: Home / Self Care | Payer: PPO | Source: Ambulatory Visit | Attending: Urology | Admitting: Urology

## 2017-12-23 DIAGNOSIS — R972 Elevated prostate specific antigen [PSA]: Secondary | ICD-10-CM | POA: Diagnosis not present

## 2017-12-23 MED ORDER — GADOBENATE DIMEGLUMINE 529 MG/ML IV SOLN
20.0000 mL | Freq: Once | INTRAVENOUS | Status: AC | PRN
Start: 2017-12-23 — End: 2017-12-23
  Administered 2017-12-23: 20 mL via INTRAVENOUS

## 2017-12-24 MED FILL — JANUMET 50-1,000 MG TABLET: 50-1000 | 30 days supply | Qty: 60 | Fill #2

## 2017-12-26 MED FILL — levoFLOXacin 750 MG TABS: 750 | 1 days supply | Qty: 1 | Fill #0

## 2018-01-12 DIAGNOSIS — R972 Elevated prostate specific antigen [PSA]: Secondary | ICD-10-CM | POA: Diagnosis not present

## 2018-01-15 ENCOUNTER — Other Ambulatory Visit: Payer: Self-pay | Admitting: Urology

## 2018-01-15 DIAGNOSIS — C61 Malignant neoplasm of prostate: Secondary | ICD-10-CM

## 2018-01-21 DIAGNOSIS — E785 Hyperlipidemia, unspecified: Secondary | ICD-10-CM | POA: Diagnosis not present

## 2018-01-21 DIAGNOSIS — E119 Type 2 diabetes mellitus without complications: Secondary | ICD-10-CM | POA: Diagnosis not present

## 2018-01-21 DIAGNOSIS — D72819 Decreased white blood cell count, unspecified: Secondary | ICD-10-CM | POA: Diagnosis not present

## 2018-01-24 HISTORY — PX: PROSTATE BIOPSY: SHX241

## 2018-01-26 ENCOUNTER — Telehealth: Payer: Self-pay | Admitting: Medical Oncology

## 2018-01-26 DIAGNOSIS — E669 Obesity, unspecified: Secondary | ICD-10-CM | POA: Diagnosis not present

## 2018-01-26 DIAGNOSIS — E119 Type 2 diabetes mellitus without complications: Secondary | ICD-10-CM | POA: Diagnosis not present

## 2018-01-26 DIAGNOSIS — E785 Hyperlipidemia, unspecified: Secondary | ICD-10-CM | POA: Diagnosis not present

## 2018-01-26 DIAGNOSIS — D72819 Decreased white blood cell count, unspecified: Secondary | ICD-10-CM | POA: Diagnosis not present

## 2018-01-26 NOTE — Telephone Encounter (Signed)
I called pt to introduce myself as the Prostate Nurse Navigator and the Coordinator of the Prostate [place].  1. I confirmed with the patient he is aware of his referral to the clinic 02/06/18 arriving at 8:00 am.  2. I discussed the format of the clinic and the physicians he will be seeing that day.  3. I discussed where the clinic is located and how to contact me.  4. I confirmed his address and informed him I would be mailing a packet of information and forms to be completed. I asked him to bring them with him the day of his appointment.   He voiced understanding of the above. I asked him to call me if he has any questions or concerns regarding his appointments or the forms he needs to complete.

## 2018-01-27 ENCOUNTER — Encounter: Payer: Self-pay | Admitting: Medical Oncology

## 2018-01-27 ENCOUNTER — Ambulatory Visit (HOSPITAL_COMMUNITY)
Admission: RE | Admit: 2018-01-27 | Discharge: 2018-01-27 | Disposition: A | Discharge status: Home / Self Care | Payer: PPO | Source: Ambulatory Visit | Attending: Urology | Admitting: Urology

## 2018-01-27 DIAGNOSIS — C61 Malignant neoplasm of prostate: Secondary | ICD-10-CM | POA: Diagnosis not present

## 2018-01-27 MED ORDER — TECHNETIUM TC 99M MEDRONATE IV KIT
20.0000 | PACK | Freq: Once | INTRAVENOUS | Status: AC | PRN
  Administered 2018-01-27: 21 via INTRAVENOUS

## 2018-02-05 ENCOUNTER — Encounter: Payer: Self-pay | Admitting: Radiation Oncology

## 2018-02-05 ENCOUNTER — Telehealth: Payer: Self-pay | Admitting: Medical Oncology

## 2018-02-05 DIAGNOSIS — C61 Malignant neoplasm of prostate: Secondary | ICD-10-CM

## 2018-02-05 HISTORY — DX: Malignant neoplasm of prostate: C61

## 2018-02-05 NOTE — Progress Notes (Signed)
GU Location of Tumor / Histology: prostatic adenocarcinoma  If Prostate Cancer, Gleason Score is (4 + 5) and PSA is (19) on 11/24/2017. Prostate volume: 136 grams  Nathan [name] had a negative prostate biopsy in 2005 and 2014. MRI in 2015 was negative for HG prostate cancer. MRI biopsy done 01/12/18.    Past/Anticipated interventions by urology, if any: prostate biopsy, prostate biopsy, prostate biopsy, Pelvic CT, bone scan, referral to [facility]  Past/Anticipated interventions by medical oncology, if any: no  Weight changes, if any: no  Bowel/Bladder complaints, if any:    Nausea/Vomiting, if any: no  Pain issues, if any:    SAFETY ISSUES:  Prior radiation?   Pacemaker/ICD?   Possible current pregnancy? no  Is the patient on methotrexate?   Current Complaints / other details:  67 year old male. Married. [facility] at [facility]. Surgery vs radiation

## 2018-02-05 NOTE — Telephone Encounter (Signed)
Confirmed appointment for Prostate [facility] 6/14 arriving at 8 am. We reviewed [place] parking, registration and bringing his completed medical forms. He voiced understanding.

## 2018-02-06 ENCOUNTER — Encounter: Payer: Self-pay | Admitting: Medical Oncology

## 2018-02-06 ENCOUNTER — Inpatient Hospital Stay: Payer: PPO | Admitting: Oncology

## 2018-02-06 ENCOUNTER — Ambulatory Visit
Admission: RE | Admit: 2018-02-06 | Discharge: 2018-02-06 | Disposition: A | Discharge status: Home / Self Care | Payer: PPO | Source: Ambulatory Visit | Attending: Radiation Oncology | Admitting: Radiation Oncology

## 2018-02-06 ENCOUNTER — Encounter: Payer: Self-pay | Admitting: Radiation Oncology

## 2018-02-06 ENCOUNTER — Other Ambulatory Visit: Payer: Self-pay

## 2018-02-06 DIAGNOSIS — Z7984 Long term (current) use of oral hypoglycemic drugs: Secondary | ICD-10-CM | POA: Insufficient documentation

## 2018-02-06 DIAGNOSIS — Z7982 Long term (current) use of aspirin: Secondary | ICD-10-CM | POA: Insufficient documentation

## 2018-02-06 DIAGNOSIS — E785 Hyperlipidemia, unspecified: Secondary | ICD-10-CM | POA: Diagnosis not present

## 2018-02-06 DIAGNOSIS — R972 Elevated prostate specific antigen [PSA]: Secondary | ICD-10-CM | POA: Diagnosis not present

## 2018-02-06 DIAGNOSIS — N401 Enlarged prostate with lower urinary tract symptoms: Secondary | ICD-10-CM | POA: Diagnosis not present

## 2018-02-06 DIAGNOSIS — Z79899 Other long term (current) drug therapy: Secondary | ICD-10-CM | POA: Diagnosis not present

## 2018-02-06 DIAGNOSIS — C61 Malignant neoplasm of prostate: Secondary | ICD-10-CM | POA: Diagnosis not present

## 2018-02-06 DIAGNOSIS — N400 Enlarged prostate without lower urinary tract symptoms: Secondary | ICD-10-CM | POA: Diagnosis not present

## 2018-02-06 DIAGNOSIS — E119 Type 2 diabetes mellitus without complications: Secondary | ICD-10-CM | POA: Diagnosis not present

## 2018-02-06 HISTORY — DX: Malignant neoplasm of prostate: C61

## 2018-02-06 NOTE — Progress Notes (Signed)
Radiation Oncology         [phone] ________________________________  Multidisciplinary Prostate Cancer Clinic  Initial Radiation Oncology Consultation  Name: Nathan [name] MRN: [id number]  Date: 02/06/2018  DOB: 1950-09-29  QQ:[id number], Valaria Good., MD  Raynelle Bring, MD   REFERRING PHYSICIAN: Raynelle Bring, MD  DIAGNOSIS: 67 y.o. 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 Malignant neoplasm of prostate ([place]) C61     HISTORY OF PRESENT ILLNESS::Nathan [name] is a 67 y.o. gentleman. He has a longstanding history of elevated PSA with normal DREs and negative biopsies in 2005 and 2014. In 2015 he had a prostate MRI for a PSA of 16.22 and this scan was negative for any evidence of high grade prostate cancer. He deferred further biopsy at that time but continued with in close follow up with Dr. Louis Meckel and on 08/25/17 his PSA rose to 21.6, DRE remained without nodularity or abnormalities.  A follow up PSA on 10/13/17 was 24.2.  He was treated for suspected prostatitis and repeat PSA on 11/24/17 remained elevated at 19.0.    A repeat prostate MRI was performed for further evaluation on 12/23/17 showing PI-RADS category 4 lesion in the left mid gland and apical posterolateral peripheral zone, designated as region of interest #1 with UroNAV targeting data. Additionally, there was benign prostatic hypertrophy noted.  He proceeded with an MR/US fusion biopsy on 01/12/2018, showing a gleason score of 4+5 in 3 of 4 of the ROI samples, as well as the left apex lateral. Also noted was Gleason 4+4 in 1/4 of the regions of interest as well as the LML core and Gleason 4+3 disease in the left mid core. His prostate volume measured 136 gm.  A bone scan and CT Pelvis were performed for disease staging on 01/27/18 both showing no evidence of metastatic disease.  There was slight increased uptake at the left seminal vesicle on CT  images but no obvious lymphadenopathy or evidence of EPE.  The patient reviewed the biopsy results with his urologist and he has kindly been referred today to the multidisciplinary prostate cancer clinic for presentation of pathology and radiology studies in our conference for discussion of potential radiation treatment options and clinical evaluation.   PREVIOUS RADIATION THERAPY: No  PAST MEDICAL HISTORY:  has a past medical history of Diabetes mellitus without complication ([place]), H/O cardiac catheterization, Hyperlipidemia, Malignant neoplasm of prostate ([place]) (02/05/2018), and Prostate cancer ([place]).    PAST SURGICAL HISTORY: Past Surgical History:  Procedure Laterality Date  . PROSTATE BIOPSY      FAMILY HISTORY: family history includes Cancer in his mother.  SOCIAL HISTORY:  reports that he has never smoked. He has never used smokeless tobacco. He reports that he has current or past drug history. He reports that he does not drink alcohol.  ALLERGIES: Patient has no known allergies.  MEDICATIONS:  Current Outpatient Medications  Medication Sig Dispense Refill  . aspirin EC 81 MG tablet Take by mouth.    . Blood Glucose Calibration High LIQD USE TO TEST BLOOD SUGAR    . Blood Glucose Monitoring Suppl (GLUCOCOM BLOOD GLUCOSE MONITOR) DEVI USE TO TEST BLOOD SUGAR DAILY    . Lactobacillus (PROBIOTIC ACIDOPHILUS PO) Take 1 capsule by mouth daily. Reported on 02/14/2016    . Lancets MISC     . Multiple Vitamins-Minerals (CENTRUM SILVER ADULT 50+ PO) Take by mouth.    Marland Kitchen  Omega 3-6-9 Fatty Acids (OMEGA 3-6-9 COMPLEX PO) Take 2 capsules by mouth daily.    . rosuvastatin (CRESTOR) 20 MG tablet Take 20 mg by mouth daily.    . silodosin (RAPAFLO) 8 MG CAPS capsule Take 8 mg by mouth at bedtime. Reported on 02/14/2016    . sitaGLIPtin-metformin (JANUMET) 50-1000 MG tablet Take 1 tablet by mouth 2 (two) times daily with a meal.     No current facility-administered medications for this encounter.      REVIEW OF SYSTEMS:  On review of systems, the patient reports that he is doing well overall. He denies any chest pain, shortness of breath, cough, fevers, chills, night sweats, unintended weight changes. He denies any bowel disturbances, and denies abdominal pain, nausea or vomiting. He denies any new musculoskeletal or joint aches or pains. His IPSS was 15, indicating moderate urinary symptoms. He is able to complete sexual activity with most attempts. A complete review of systems is obtained and is otherwise negative.   PHYSICAL EXAM:  Wt Readings from Last 3 Encounters:  02/06/18 227 lb 3.2 oz (103.1 kg)  06/24/16 226 lb (102.5 kg)  05/21/16 230 lb 12.8 oz (104.7 kg)   Temp Readings from Last 3 Encounters:  02/06/18 98.4 F (36.9 C) (Oral)  05/05/15 100 F (37.8 C) (Oral)  04/01/15 98.4 F (36.9 C) (Oral)   BP Readings from Last 3 Encounters:  02/06/18 128/87  06/24/16 122/83  05/21/16 132/86   Pulse Readings from Last 3 Encounters:  02/06/18 98  06/24/16 88  05/21/16 100   Pain Assessment Pain Score: 0-No pain/10  In general this is a well appearing African American male in no acute distress. He is alert and oriented x4 and appropriate throughout the examination. HEENT reveals that the patient is normocephalic, atraumatic. EOMs are intact. PERRLA. Skin is intact without any evidence of gross lesions. Cardiovascular exam reveals a regular rate and rhythm, no clicks rubs or murmurs are auscultated. Chest is clear to auscultation bilaterally. Lymphatic assessment is performed and does not reveal any adenopathy in the cervical, supraclavicular, axillary, or inguinal chains. Abdomen has active bowel sounds in all quadrants and is intact. The abdomen is soft, non tender, non distended. Lower extremities are negative for pretibial pitting edema, deep calf tenderness, cyanosis or clubbing.  KPS = 100  100 - Normal; no complaints; no evidence of disease. 90   - Able to carry on  normal activity; minor signs or symptoms of disease. 80   - Normal activity with effort; some signs or symptoms of disease. 31   - Cares for self; unable to carry on normal activity or to do active work. 60   - Requires occasional assistance, but is able to care for most of his personal needs. 50   - Requires considerable assistance and frequent medical care. 39   - Disabled; requires special care and assistance. 24   - Severely disabled; hospital admission is indicated although death not imminent. 23   - Very sick; hospital admission necessary; active supportive treatment necessary. 10   - Moribund; fatal processes progressing rapidly. 0     - Dead  Karnofsky DA, Abelmann [place], Craver LS and Burchenal [facility] [phone]) The use of the nitrogen mustards in the palliative treatment of carcinoma: with particular reference to bronchogenic carcinoma Cancer 1 634-56   LABORATORY DATA:  Lab Results  Component Value Date   WBC 13.6 (H) 05/05/2015   HGB 13.5 05/05/2015   HCT 41.0 05/05/2015   MCV  82.5 05/05/2015   PLT 124 (L) 05/05/2015   Lab Results  Component Value Date   NA 134 (L) 05/05/2015   K 3.9 05/05/2015   CL 99 (L) 05/05/2015   CO2 25 05/05/2015   Lab Results  Component Value Date   ALT 18 05/05/2015   AST 20 05/05/2015   ALKPHOS 61 05/05/2015   BILITOT 1.0 05/05/2015     RADIOGRAPHY: Nm Bone Scan Whole Body  Result Date: 01/27/2018 CLINICAL DATA:  Prostate cancer, PSA = 19 EXAM: NUCLEAR MEDICINE WHOLE BODY BONE SCAN TECHNIQUE: Whole body anterior and posterior images were obtained approximately 3 hours after intravenous injection of radiopharmaceutical. RADIOPHARMACEUTICALS:  21 mCi Technetium-27m MDP IV COMPARISON:  None Radiographic correlation: CT pelvis 01/27/2018 FINDINGS: Uptake at the shoulders, knees and feet typically degenerative. Uptake at the lateral aspects of the lower lumbar spine bilaterally at L4-L5 likely degenerative and corresponding to advanced facet degenerative  changes on CT. No focal sites of abnormal osseous tracer accumulation are identified which are suspicious for osseous metastases. Expected urinary tract and soft tissue distribution of tracer. IMPRESSION: Scattered degenerative type uptake as above. No scintigraphic evidence of osseous metastatic disease. Electronically Signed   By: Lavonia Dana M.D.   On: 01/27/2018 14:06      IMPRESSION/PLAN: 67 y.o. gentleman with a high risk, stage T1c adenocarcinoma of the prostate with a PSA of 19.0 and a Gleason score of 4+5.   We discussed the patient's workup and outlines the nature of prostate cancer in this setting. The patient's T stage, Gleason's score, and PSA put him into the high risk group. Accordingly, he is eligible for a variety of potential treatment options including prostatectomy or LT-ADT in combination with either 8 weeks of external radiation or 5 weeks of external radiation followed by a brachytherapy boost. We discussed the available radiation techniques, and focused on the details and logistics and delivery. The patient is not a candidate for brachytherapy boost with a prostate volume of 136 gm.  We discussed and outlined the risks, benefits, short and long-term effects associated with external beam radiotherapy and compared and contrasted these with prostatectomy. We discussed the role of SpaceOAR in reducing the rectal toxicity associated with radiotherapy. We also detailed the role of ADT in the treatment of high risk prostate cancer and outlined the associated side effects that could be expected with this therapy.  At the end of the conversation the patient is interested in moving forward with prostatectomy. He will meet with Dr. Alinda Money to further discuss his surgical options.  We did briefly discuss the potential role for post-operative adjuvant/salvage radiotherapy in the setting of adverse surgical pathology and/or detectable PSA following prostatectomy. He appears to have a good  understanding of his disease and treatment options.  We will share our discussion with Dr. Louis Meckel and Dr. Alinda Money so that he mau move forward with scheduling prostatectomy in the near future.   We enjoyed meeting him and his daughters today and would be happy to continue to participate in the care of this nice gentleman if indicated in the future.    More than 50% of today's visit was spent in counseling and/or coordination of care.     Nicholos Johns, PA-C    Tyler Pita, MD  [facility] Direct Dial: [phone]  Fax: [phone] [email or web address]  Skype  [email or web address]    This document serves as a record of services personally performed by Tyler Pita, MD and Freeman Caldron, PA-C. It  was created on their behalf by Margit Banda, a trained medical scribe. The creation of this record is based on the scribe's personal observations and the provider's statements to them. This document has been checked and approved by the attending provider.

## 2018-02-06 NOTE — Progress Notes (Signed)
                               Care Plan Summary  Name: Nathan [name] DOB: 08/06/1951  Your Medical Team:   Urologist -  Dr. Raynelle Bring, [facility]  Radiation Oncologist - Dr. Tyler Pita, [facility]   Medical Oncologist - Dr. Zola Button, [place]  Recommendations: 1) Robotic prostatectomy  2) Radiation  * These recommendations are based on information available as of today's consult.      Recommendations may change depending on the results of further tests or exams.  Next Steps: 1) Consider your treatment options and call Cira Rue, RN with decision    When appointments need to be scheduled, you will be contacted by [facility] and/or [facility].  Patient provided with business cards for all team members and a copy of "Fall Prevention Patient Safety Sheet".  Questions?  Please do not hesitate to call Cira Rue, RN, BSN, OCN at [phone] any questions or concerns.  Shirlean Mylar is your Oncology Nurse Navigator and is available to assist you while you're receiving your medical care at [facility].

## 2018-02-17 ENCOUNTER — Telehealth: Payer: Self-pay | Admitting: Medical Oncology

## 2018-02-17 NOTE — Telephone Encounter (Signed)
Spoke with patient to inform him of PT appointment 7/15 at 10 am- [facility] with Opal Sidles. I will follow up with Dr. Alinda Money regarding a date for his surgery. He voiced understanding of the above.

## 2018-02-17 NOTE — Telephone Encounter (Signed)
Spoke with Nathan [name] as follow up to [facility]. He states that he has decided to move forward with prostatectomy. He called Dr. Lynne Logan office and asked if he could be scheduled for last week of July or first week in August but he has not been scheduled. He also needs an appointment for PT. He is currently out of town and I offered to call for PT appointment. He states he would greatly appreciated my help. I will call him back with appointment. He voiced understanding.

## 2018-02-18 ENCOUNTER — Other Ambulatory Visit: Payer: Self-pay | Admitting: Urology

## 2018-02-23 MED FILL — JANUMET 50-1,000 MG TABLET: 50-1000 | 30 days supply | Qty: 60 | Fill #3

## 2018-03-09 DIAGNOSIS — C61 Malignant neoplasm of prostate: Secondary | ICD-10-CM | POA: Diagnosis not present

## 2018-03-09 DIAGNOSIS — M6281 Muscle weakness (generalized): Secondary | ICD-10-CM | POA: Diagnosis not present

## 2018-03-19 NOTE — Patient Instructions (Addendum)
Nathan [name]  03/19/2018   Your procedure is scheduled on: Monday 03/30/2018  Report to [facility] Main  Entrance              Report to admitting at [place] AM    Call this number if you have problems the morning of surgery [phone]    Remember: Do not eat food after Saturday 03/28/2018!              Follow bowel prep instructions from Dr. Lynne Logan office on Sunday 03/29/2018 with a clear liquid diet!              Drink 8 ounces of Magnesium Citrate at noon the day before surgery on 03/29/2018 and drink clear liquids all day up until midnight!              The night before surgery on Sunday 03/29/2018, use a Fleet's enema before going to bed!     CLEAR LIQUID DIET   Foods Allowed                                                                     Foods Excluded  Coffee and tea, regular and decaf                             liquids that you cannot  Plain Jell-O in any flavor                                             see through such as: Fruit ices (not with fruit pulp)                                     milk, soups, orange juice  Iced Popsicles                                    All solid food Carbonated beverages, regular and diet                                    Cranberry, grape and apple juices Sports drinks like Gatorade Lightly seasoned clear broth or consume(fat free) Sugar, honey syrup  Sample Menu Breakfast                                Lunch                                     Supper Cranberry juice                    Beef broth  Chicken broth Jell-O                                     Grape juice                           Apple juice Coffee or tea                        Jell-O                                      Popsicle                                                Coffee or tea                        Coffee or tea  _____________________________________________________________________     Take  these medicines the morning of surgery with A SIP OF WATER: none               May take the day before surgery , Janumet as usual.               DO NOT TAKE ANY DIABETIC MEDICATIONS DAY OF YOUR SURGERY!                               You may not have any metal on your body including hair pins and              piercings  Do not wear jewelry, make-up, lotions, powders or perfumes, deodorant                         Men may shave face and neck.   Do not bring valuables to the hospital. [place].  Contacts, dentures or bridgework may not be worn into surgery.  Leave suitcase in the car. After surgery it may be brought to your room.                   Please read over the following fact sheets you were given: _____________________________________________________________________             [facility] - Preparing for Surgery Before surgery, you can play an important role.  Because skin is not sterile, your skin needs to be as free of germs as possible.  You can reduce the number of germs on your skin by washing with CHG (chlorahexidine gluconate) soap before surgery.  CHG is an antiseptic cleaner which kills germs and bonds with the skin to continue killing germs even after washing. Please DO NOT use if you have an allergy to CHG or antibacterial soaps.  If your skin becomes reddened/irritated stop using the CHG and inform your nurse when you arrive at Short Stay. Do not shave (including legs and underarms) for at least 48 hours prior to the first CHG shower.  You may  shave your face/neck. Please follow these instructions carefully:  1.  Shower with CHG Soap the night before surgery and the  morning of Surgery.  2.  If you choose to wash your hair, wash your hair first as usual with your  normal  shampoo.  3.  After you shampoo, rinse your hair and body thoroughly to remove the  shampoo.                           4.  Use CHG as you would any  other liquid soap.  You can apply chg directly  to the skin and wash                       Gently with a scrungie or clean washcloth.  5.  Apply the CHG Soap to your body ONLY FROM THE NECK DOWN.   Do not use on face/ open                           Wound or open sores. Avoid contact with eyes, ears mouth and genitals (private parts).                       Wash face,  Genitals (private parts) with your normal soap.             6.  Wash thoroughly, paying special attention to the area where your surgery  will be performed.  7.  Thoroughly rinse your body with warm water from the neck down.  8.  DO NOT shower/wash with your normal soap after using and rinsing off  the CHG Soap.                9.  Pat yourself dry with a clean towel.            10.  Wear clean pajamas.            11.  Place clean sheets on your bed the night of your first shower and do not  sleep with pets. Day of Surgery : Do not apply any lotions/deodorants the morning of surgery.  Please wear clean clothes to the hospital/surgery center.  FAILURE TO FOLLOW THESE INSTRUCTIONS MAY RESULT IN THE CANCELLATION OF YOUR SURGERY PATIENT SIGNATURE_________________________________  NURSE SIGNATURE__________________________________  ________________________________________________________________________   Nathan [name]  An incentive spirometer is a tool that can help keep your lungs clear and active. This tool measures how well you are filling your lungs with each breath. Taking long deep breaths may help reverse or decrease the chance of developing breathing (pulmonary) problems (especially infection) following:  A long period of time when you are unable to move or be active. BEFORE THE PROCEDURE   If the spirometer includes an indicator to show your best effort, your nurse or respiratory therapist will set it to a desired goal.  If possible, sit up straight or lean slightly forward. Try not to slouch.  Hold the  incentive spirometer in an upright position. INSTRUCTIONS FOR USE  1. Sit on the edge of your bed if possible, or sit up as far as you can in bed or on a chair. 2. Hold the incentive spirometer in an upright position. 3. Breathe out normally. 4. Place the mouthpiece in your mouth and seal your lips tightly around it. 5. Breathe in slowly and as deeply as possible, raising the  piston or the ball toward the top of the column. 6. Hold your breath for 3-5 seconds or for as long as possible. Allow the piston or ball to fall to the bottom of the column. 7. Remove the mouthpiece from your mouth and breathe out normally. 8. Rest for a few seconds and repeat Steps 1 through 7 at least 10 times every 1-2 hours when you are awake. Take your time and take a few normal breaths between deep breaths. 9. The spirometer may include an indicator to show your best effort. Use the indicator as a goal to work toward during each repetition. 10. After each set of 10 deep breaths, practice coughing to be sure your lungs are clear. If you have an incision (the cut made at the time of surgery), support your incision when coughing by placing a pillow or rolled up towels firmly against it. Once you are able to get out of bed, walk around indoors and cough well. You may stop using the incentive spirometer when instructed by your caregiver.  RISKS AND COMPLICATIONS  Take your time so you do not get dizzy or light-headed.  If you are in pain, you may need to take or ask for pain medication before doing incentive spirometry. It is harder to take a deep breath if you are having pain. AFTER USE  Rest and breathe slowly and easily.  It can be helpful to keep track of a log of your progress. Your caregiver can provide you with a simple table to help with this. If you are using the spirometer at home, follow these instructions: [place] IF:   You are having difficultly using the spirometer.  You have trouble using  the spirometer as often as instructed.  Your pain medication is not giving enough relief while using the spirometer.  You develop fever of 100.5 F (38.1 C) or higher. SEEK IMMEDIATE MEDICAL CARE IF:   You cough up bloody sputum that had not been present before.  You develop fever of 102 F (38.9 C) or greater.  You develop worsening pain at or near the incision site. MAKE SURE YOU:   Understand these instructions.  Will watch your condition.  Will get help right away if you are not doing well or get worse. Document Released: 12/23/2006 Document Revised: 11/04/2011 Document Reviewed: 02/23/2007 ExitCare Patient Information 2014 ExitCare, [place].   ________________________________________________________________________  WHAT IS A BLOOD TRANSFUSION? Blood Transfusion Information  A transfusion is the replacement of blood or some of its parts. Blood is made up of multiple cells which provide different functions.  Red blood cells carry oxygen and are used for blood loss replacement.  White blood cells fight against infection.  Platelets control bleeding.  Plasma helps clot blood.  Other blood products are available for specialized needs, such as hemophilia or other clotting disorders. BEFORE THE TRANSFUSION  Who gives blood for transfusions?   Healthy volunteers who are fully evaluated to make sure their blood is safe. This is blood bank blood. Transfusion therapy is the safest it has ever been in the practice of medicine. Before blood is taken from a donor, a complete history is taken to make sure that person has no history of diseases nor engages in risky social behavior (examples are intravenous drug use or sexual activity with multiple partners). The donor's travel history is screened to minimize risk of transmitting infections, such as malaria. The donated blood is tested for signs of infectious diseases, such as HIV and hepatitis.  The blood is then tested to be sure it  is compatible with you in order to minimize the chance of a transfusion reaction. If you or a relative donates blood, this is often done in anticipation of surgery and is not appropriate for emergency situations. It takes many days to process the donated blood. RISKS AND COMPLICATIONS Although transfusion therapy is very safe and saves many lives, the main dangers of transfusion include:   Getting an infectious disease.  Developing a transfusion reaction. This is an allergic reaction to something in the blood you were given. Every precaution is taken to prevent this. The decision to have a blood transfusion has been considered carefully by your caregiver before blood is given. Blood is not given unless the benefits outweigh the risks. AFTER THE TRANSFUSION  Right after receiving a blood transfusion, you will usually feel much better and more energetic. This is especially true if your red blood cells have gotten low (anemic). The transfusion raises the level of the red blood cells which carry oxygen, and this usually causes an energy increase.  The nurse administering the transfusion will monitor you carefully for complications. HOME CARE INSTRUCTIONS  No special instructions are needed after a transfusion. You may find your energy is better. Speak with your caregiver about any limitations on activity for underlying diseases you may have. SEEK MEDICAL CARE IF:   Your condition is not improving after your transfusion.  You develop redness or irritation at the intravenous (IV) site. SEEK IMMEDIATE MEDICAL CARE IF:  Any of the following symptoms occur over the next 12 hours:  Shaking chills.  You have a temperature by mouth above 102 F (38.9 C), not controlled by medicine.  Chest, back, or muscle pain.  People around you feel you are not acting correctly or are confused.  Shortness of breath or difficulty breathing.  Dizziness and fainting.  You get a rash or develop hives.  You  have a decrease in urine output.  Your urine turns a dark color or changes to pink, red, or brown. Any of the following symptoms occur over the next 10 days:  You have a temperature by mouth above 102 F (38.9 C), not controlled by medicine.  Shortness of breath.  Weakness after normal activity.  The white part of the eye turns yellow (jaundice).  You have a decrease in the amount of urine or are urinating less often.  Your urine turns a dark color or changes to pink, red, or brown. Document Released: 08/09/2000 Document Revised: 11/04/2011 Document Reviewed: 03/28/2008 [facility] Patient Information 2014 [place], [place].  _______________________________________________________________________

## 2018-03-23 ENCOUNTER — Other Ambulatory Visit: Payer: Self-pay

## 2018-03-23 ENCOUNTER — Encounter (HOSPITAL_COMMUNITY): Payer: Self-pay

## 2018-03-23 ENCOUNTER — Encounter (HOSPITAL_COMMUNITY)
Admission: RE | Admit: 2018-03-23 | Discharge: 2018-03-23 | Disposition: A | Discharge status: Home / Self Care | Payer: PPO | Source: Ambulatory Visit | Attending: Urology | Admitting: Urology

## 2018-03-23 DIAGNOSIS — Z01810 Encounter for preprocedural cardiovascular examination: Secondary | ICD-10-CM | POA: Insufficient documentation

## 2018-03-23 DIAGNOSIS — C61 Malignant neoplasm of prostate: Secondary | ICD-10-CM | POA: Insufficient documentation

## 2018-03-23 DIAGNOSIS — N393 Stress incontinence (female) (male): Secondary | ICD-10-CM | POA: Diagnosis not present

## 2018-03-23 DIAGNOSIS — Z01812 Encounter for preprocedural laboratory examination: Secondary | ICD-10-CM | POA: Insufficient documentation

## 2018-03-23 DIAGNOSIS — M62838 Other muscle spasm: Secondary | ICD-10-CM | POA: Diagnosis not present

## 2018-03-23 DIAGNOSIS — M6281 Muscle weakness (generalized): Secondary | ICD-10-CM | POA: Diagnosis not present

## 2018-03-23 DIAGNOSIS — E119 Type 2 diabetes mellitus without complications: Secondary | ICD-10-CM | POA: Diagnosis not present

## 2018-03-23 LAB — HEMOGLOBIN A1C
Hgb A1c MFr Bld: 7.4 % — ABNORMAL HIGH (ref 4.8–5.6)
Mean Plasma Glucose: 165.68 mg/dL

## 2018-03-23 LAB — CBC
HCT: 45.3 % (ref 39.0–52.0)
Hemoglobin: 14.8 g/dL (ref 13.0–17.0)
MCH: 27.5 pg (ref 26.0–34.0)
MCHC: 32.7 g/dL (ref 30.0–36.0)
MCV: 84 fL (ref 78.0–100.0)
PLATELETS: 237 10*3/uL (ref 150–400)
RBC: 5.39 MIL/uL (ref 4.22–5.81)
RDW: 12.9 % (ref 11.5–15.5)
WBC: 5.7 10*3/uL (ref 4.0–10.5)

## 2018-03-23 LAB — BASIC METABOLIC PANEL
Anion gap: 6 (ref 5–15)
BUN: 16 mg/dL (ref 8–23)
CALCIUM: 9.5 mg/dL (ref 8.9–10.3)
CO2: 29 mmol/L (ref 22–32)
CREATININE: 1.21 mg/dL (ref 0.61–1.24)
Chloride: 105 mmol/L (ref 98–111)
GFR calc Af Amer: >60 mL/min (ref >60)
GFR calc non Af Amer: >60 mL/min (ref >60)
GLUCOSE: 141 mg/dL — AB (ref 70–99)
Potassium: 4.5 mmol/L (ref 3.5–5.1)
Sodium: 140 mmol/L (ref 135–145)

## 2018-03-23 LAB — ABO/RH: ABO/RH(D): O POS

## 2018-03-23 LAB — GLUCOSE, CAPILLARY: GLUCOSE-CAPILLARY: 138 mg/dL — AB (ref 70–99)

## 2018-03-27 NOTE — H&P (Signed)
CC/HPI: CC: Prostate Cancer     Nathan [name] is a 67 year old gentleman with a long standing history of an elevated PSA s/p negative prostate biopsies in 2005 and 2014. An MRI of the prostate in 2015 was unremarkable. His PSA has continued to increase and was 19.0 in April. A repeat prostate MRI in April indicated a 1.6 cm PI RADS 4 lesion at the left mid prostate. An MR/ultrasound fusion biopsy was performed and demonstrated all 4 targeted biopsies were positive for high grade (Gleason 8 and 9 disease) and 3 out of 12 systematic cores were positive as well. He has undergone staging studies that are negative for obvious metastatic disease. He works as a Clinical biochemist at [employer].   Family history: None.   Imaging studies:  MRI (12/23/17): Negative for EPE, SVI, or LAD  CT (01/27/18): Negative for metastatic disease.  Bone scan (01/27/18): Negative for metastatic disease.   PMH: He has a history of diabetes and hyperlipidemia. He is the primary caretaker for his wife who is nonambulatory due to multiple sclerosis. He does have two helpful daughters including a daughter who is a Chief Executive Officer and [place] and a daughter who is a Designer, jewellery in [place].  PSH: No abdominal surgeries.   TNM stage: cT1c N0 M0  PSA: 19.0  Gleason score: 4+5=9  Biopsy (01/12/18): 7/16 cores positive  Left: L lateral apex (40%, 4+5=9), L lateral mid (60%, 4+4=8, PNI), L mid (30%, 4+3=7, PNI)  Right: Benign  MR targeted: 4/4 positive (50% - 4+5=9, 50%, 4+5=9, 70%, 4+5=9, 70%, 4+4=8, PNI)  Prostate volume: 136 cc   Nomogram  OC disease: 15%  EPE: 82%  SVI: 22%  LNI: 23%  PFS (5 year, 10 year): 27%, 16%   Urinary function: IPSS is 15.  Erectile function: SHIM score is 16. He is able to obtain an erection but does have some difficulty maintaining erection. However, with sildenafil, he typically can maintain decent erection even with a 20 mg dosing.       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 BPH w/o LUTS - 2017 BPH w/LUTS, Benign localized prostatic hyperplasia with lower urinary tract symptoms (LUTS) - 2016 Nocturia, Nocturia - 2015 Premature ejaculation, Premature ejaculation - 2015    NON-GU PMH: Hypercholesterolemia Hypertension     FAMILY HISTORY: Family Health Status - Father alive at age 43 - 68 In Family Family Health Status - Mother's Age - Runs In Family Family Health Status Number - Runs In Family No pertinent family history - Other    SOCIAL HISTORY: Marital Status: Married Preferred Language: English; Ethnicity: ; Race: Black or African American Current Smoking Status: Patient has never smoked.  Has never drank.  Drinks 1 caffeinated drink per day.  Notes: Never A Smoker, Marital History - Currently Married, Caffeine Use, Alcohol Use, Occupation:    REVIEW OF SYSTEMS:      GU Review Male:  Patient denies frequent urination, hard to postpone urination, burning/ pain with urination, get up at night to urinate, leakage of urine, stream starts and stops, trouble starting your streams, and have to strain to urinate .     Gastrointestinal (Lower):  Patient denies diarrhea and constipation.     Gastrointestinal (Upper):  Patient denies nausea and vomiting.     Constitutional:  Patient denies fever, night sweats, weight loss, and fatigue.     Skin:  Patient denies skin rash/ lesion and itching.     Eyes:  Patient denies blurred vision and double vision.     Ears/ Nose/ Throat:  Patient denies  sore throat and sinus problems.     Hematologic/Lymphatic:  Patient denies swollen glands and easy bruising.     Cardiovascular:  Patient denies leg swelling and chest pains.     Respiratory:  Patient denies cough and shortness of breath.     Endocrine:  Patient denies excessive thirst.     Musculoskeletal:  Patient denies back pain and joint pain.     Neurological:  Patient denies headaches and dizziness.     Psychologic:  Patient denies depression and anxiety.     VITAL SIGNS: None      GU PHYSICAL EXAMINATION:       Prostate: Prostate about 80 grams. Left lobe normal consistency, right lobe normal consistency. Symmetrical lobes. No prostate nodule. Left lobe no tenderness, right lobe no tenderness.       MULTI-SYSTEM PHYSICAL EXAMINATION:       Constitutional: Well-nourished. No physical deformities. Normally developed. Good grooming.      Neck: Neck symmetrical, not swollen. Normal tracheal position.      Respiratory: No labored breathing, no use of accessory muscles. Normal breath sounds. Clear bilaterally.      Cardiovascular: Regular rate and rhythm. Normal temperature, normal extremity pulses, no swelling, no varicosities.       Lymphatic: No enlargement of neck, axillae, groin.      Skin: No paleness, no jaundice, no cyanosis. No lesion, no ulcer, no rash.      Neurologic / Psychiatric: Oriented to time, oriented to place, oriented to person. No depression, no anxiety, no agitation.      Gastrointestinal: No mass, no tenderness, no rigidity, non obese abdomen.      Eyes: Normal conjunctivae. Normal eyelids.      Ears, Nose, Mouth, and Throat: Left ear no scars, no lesions, no masses. Right ear no scars, no lesions, no masses. Nose no scars, no lesions, no masses. Normal hearing. Normal lips.      Musculoskeletal: Normal gait and station of head and neck.               ASSESSMENT:     ICD-10 Details  1 GU:  Prostate Cancer - C61       1. Prostate cancer: He has elected to  proceed with surgical treatment.  He will undergo a robot assisted laparoscopic radical prostatectomy. I discussed the potential benefits and risks of the procedure, side effects of the proposed treatment, the likelihood of the patient achieving the goals of the procedure, and any potential problems that might occur during the procedure or recuperation.

## 2018-03-30 ENCOUNTER — Observation Stay (HOSPITAL_COMMUNITY)
Admission: RE | Admit: 2018-03-30 | Discharge: 2018-03-31 | Disposition: A | Discharge status: Home / Self Care | Payer: PPO | Source: Ambulatory Visit | Attending: Urology | Admitting: Urology

## 2018-03-30 ENCOUNTER — Encounter (HOSPITAL_COMMUNITY): Payer: Self-pay | Admitting: *Deleted

## 2018-03-30 ENCOUNTER — Ambulatory Visit (HOSPITAL_COMMUNITY): Payer: PPO | Admitting: Anesthesiology

## 2018-03-30 ENCOUNTER — Encounter (HOSPITAL_COMMUNITY)
Admission: RE | Disposition: A | Discharge status: Home / Self Care | Payer: Self-pay | Source: Ambulatory Visit | Attending: Urology

## 2018-03-30 ENCOUNTER — Other Ambulatory Visit: Payer: Self-pay

## 2018-03-30 DIAGNOSIS — C61 Malignant neoplasm of prostate: Secondary | ICD-10-CM | POA: Diagnosis not present

## 2018-03-30 DIAGNOSIS — E119 Type 2 diabetes mellitus without complications: Secondary | ICD-10-CM | POA: Diagnosis not present

## 2018-03-30 DIAGNOSIS — E7800 Pure hypercholesterolemia, unspecified: Secondary | ICD-10-CM | POA: Insufficient documentation

## 2018-03-30 DIAGNOSIS — Z79899 Other long term (current) drug therapy: Secondary | ICD-10-CM | POA: Insufficient documentation

## 2018-03-30 DIAGNOSIS — Z7984 Long term (current) use of oral hypoglycemic drugs: Secondary | ICD-10-CM | POA: Diagnosis not present

## 2018-03-30 DIAGNOSIS — I10 Essential (primary) hypertension: Secondary | ICD-10-CM | POA: Diagnosis not present

## 2018-03-30 DIAGNOSIS — E785 Hyperlipidemia, unspecified: Secondary | ICD-10-CM | POA: Insufficient documentation

## 2018-03-30 HISTORY — PX: LYMPHADENECTOMY: SHX5960

## 2018-03-30 HISTORY — PX: ROBOT ASSISTED LAPAROSCOPIC RADICAL PROSTATECTOMY: SHX5141

## 2018-03-30 LAB — GLUCOSE, CAPILLARY
GLUCOSE-CAPILLARY: 153 mg/dL — AB (ref 70–99)
GLUCOSE-CAPILLARY: 167 mg/dL — AB (ref 70–99)
Glucose-Capillary: 178 mg/dL — ABNORMAL HIGH (ref 70–99)
Glucose-Capillary: 133 mg/dL — ABNORMAL HIGH (ref 70–99)

## 2018-03-30 LAB — TYPE AND SCREEN
ABO/RH(D): O POS
Antibody Screen: NEGATIVE

## 2018-03-30 LAB — HEMOGLOBIN AND HEMATOCRIT, BLOOD
HCT: 42.1 % (ref 39.0–52.0)
Hemoglobin: 13.7 g/dL (ref 13.0–17.0)

## 2018-03-30 SURGERY — XI ROBOTIC ASSISTED LAPAROSCOPIC RADICAL PROSTATECTOMY LEVEL 3
Anesthesia: General

## 2018-03-30 MED ORDER — MIDAZOLAM HCL 2 MG/2ML IJ SOLN
INTRAMUSCULAR | Status: DC | PRN
  Administered 2018-03-30: 2 mg via INTRAVENOUS

## 2018-03-30 MED ORDER — SUGAMMADEX SODIUM 200 MG/2ML IV SOLN
INTRAVENOUS | Status: DC | PRN
  Administered 2018-03-30: 200 mg via INTRAVENOUS

## 2018-03-30 MED ORDER — KETOROLAC TROMETHAMINE 15 MG/ML IJ SOLN
15.0000 mg | Freq: Four times a day (QID) | INTRAMUSCULAR | Status: DC
  Administered 2018-03-30 – 2018-03-31 (×4): 15 mg via INTRAVENOUS
  Filled 2018-03-30 (×3): qty 1

## 2018-03-30 MED ORDER — SUCCINYLCHOLINE CHLORIDE 200 MG/10ML IV SOSY
PREFILLED_SYRINGE | INTRAVENOUS | Status: AC
  Filled 2018-03-30: qty 10

## 2018-03-30 MED ORDER — FENTANYL CITRATE (PF) 100 MCG/2ML IJ SOLN
INTRAMUSCULAR | Status: AC
  Filled 2018-03-30: qty 2

## 2018-03-30 MED ORDER — TRAMADOL HCL 50 MG PO TABS: 50.0000 mg | ORAL_TABLET | Freq: Four times a day (QID) | ORAL | 0 refills | Status: DC | PRN

## 2018-03-30 MED ORDER — CEFAZOLIN SODIUM-DEXTROSE 2-4 GM/100ML-% IV SOLN
2.0000 g | Freq: Once | INTRAVENOUS | Status: AC
  Administered 2018-03-30: 2 g via INTRAVENOUS
  Filled 2018-03-30: qty 100

## 2018-03-30 MED ORDER — PROPOFOL 10 MG/ML IV BOLUS
INTRAVENOUS | Status: DC | PRN
  Administered 2018-03-30: 200 mg via INTRAVENOUS

## 2018-03-30 MED ORDER — DEXAMETHASONE SODIUM PHOSPHATE 10 MG/ML IJ SOLN
INTRAMUSCULAR | Status: AC
  Filled 2018-03-30: qty 1

## 2018-03-30 MED ORDER — SUGAMMADEX SODIUM 200 MG/2ML IV SOLN
INTRAVENOUS | Status: AC
  Filled 2018-03-30: qty 2

## 2018-03-30 MED ORDER — CEFAZOLIN SODIUM-DEXTROSE 1-4 GM/50ML-% IV SOLN
1.0000 g | Freq: Three times a day (TID) | INTRAVENOUS | Status: AC
  Administered 2018-03-30 – 2018-03-31 (×2): 1 g via INTRAVENOUS
  Filled 2018-03-30 (×2): qty 50

## 2018-03-30 MED ORDER — SODIUM CHLORIDE 0.9 % IR SOLN
Status: DC | PRN
  Administered 2018-03-30: 1000 mL

## 2018-03-30 MED ORDER — ROCURONIUM BROMIDE 10 MG/ML (PF) SYRINGE
PREFILLED_SYRINGE | INTRAVENOUS | Status: AC
  Filled 2018-03-30: qty 10

## 2018-03-30 MED ORDER — FLEET ENEMA 7-19 GM/118ML RE ENEM: 1.0000 | ENEMA | Freq: Once | RECTAL | Status: DC

## 2018-03-30 MED ORDER — LACTATED RINGERS IV SOLN
INTRAVENOUS | Status: DC
  Administered 2018-03-30 (×2): via INTRAVENOUS

## 2018-03-30 MED ORDER — HYDROMORPHONE HCL 1 MG/ML IJ SOLN
INTRAMUSCULAR | Status: AC
  Filled 2018-03-30: qty 1

## 2018-03-30 MED ORDER — BUPIVACAINE-EPINEPHRINE 0.25% -1:200000 IJ SOLN
INTRAMUSCULAR | Status: DC | PRN
  Administered 2018-03-30: 30 mL

## 2018-03-30 MED ORDER — PROPOFOL 10 MG/ML IV BOLUS
INTRAVENOUS | Status: AC
  Filled 2018-03-30: qty 20

## 2018-03-30 MED ORDER — ONDANSETRON HCL 4 MG/2ML IJ SOLN
INTRAMUSCULAR | Status: DC | PRN
  Administered 2018-03-30: 4 mg via INTRAVENOUS

## 2018-03-30 MED ORDER — ROSUVASTATIN CALCIUM 20 MG PO TABS
20.0000 mg | ORAL_TABLET | Freq: Every day | ORAL | Status: DC
  Filled 2018-03-30: qty 1

## 2018-03-30 MED ORDER — LIDOCAINE 2% (20 MG/ML) 5 ML SYRINGE
INTRAMUSCULAR | Status: AC
  Filled 2018-03-30: qty 5

## 2018-03-30 MED ORDER — DIPHENHYDRAMINE HCL 12.5 MG/5ML PO ELIX: 12.5000 mg | ORAL_SOLUTION | Freq: Four times a day (QID) | ORAL | Status: DC | PRN

## 2018-03-30 MED ORDER — MEPERIDINE HCL 50 MG/ML IJ SOLN: 6.2500 mg | INTRAMUSCULAR | Status: DC | PRN

## 2018-03-30 MED ORDER — DOCUSATE SODIUM 100 MG PO CAPS
100.0000 mg | ORAL_CAPSULE | Freq: Two times a day (BID) | ORAL | Status: DC
  Administered 2018-03-30 – 2018-03-31 (×2): 100 mg via ORAL
  Filled 2018-03-30 (×2): qty 1

## 2018-03-30 MED ORDER — ACETAMINOPHEN 325 MG PO TABS: 650.0000 mg | ORAL_TABLET | ORAL | Status: DC | PRN

## 2018-03-30 MED ORDER — DIPHENHYDRAMINE HCL 50 MG/ML IJ SOLN: 12.5000 mg | Freq: Four times a day (QID) | INTRAMUSCULAR | Status: DC | PRN

## 2018-03-30 MED ORDER — LIDOCAINE 2% (20 MG/ML) 5 ML SYRINGE
INTRAMUSCULAR | Status: DC | PRN
  Administered 2018-03-30: 100 mg via INTRAVENOUS

## 2018-03-30 MED ORDER — DEXAMETHASONE SODIUM PHOSPHATE 10 MG/ML IJ SOLN
INTRAMUSCULAR | Status: DC | PRN
  Administered 2018-03-30: 10 mg via INTRAVENOUS

## 2018-03-30 MED ORDER — SULFAMETHOXAZOLE-TRIMETHOPRIM 800-160 MG PO TABS: 1.0000 | ORAL_TABLET | Freq: Two times a day (BID) | ORAL | 0 refills | Status: DC

## 2018-03-30 MED ORDER — MORPHINE SULFATE (PF) 2 MG/ML IV SOLN
2.0000 mg | INTRAVENOUS | Status: DC | PRN
  Administered 2018-03-30: 2 mg via INTRAVENOUS
  Filled 2018-03-30 (×2): qty 1

## 2018-03-30 MED ORDER — HYDROMORPHONE HCL 1 MG/ML IJ SOLN
0.2500 mg | INTRAMUSCULAR | Status: DC | PRN
  Administered 2018-03-30 (×2): 0.25 mg via INTRAVENOUS
  Administered 2018-03-30: 0.5 mg via INTRAVENOUS
  Administered 2018-03-30 (×2): 0.25 mg via INTRAVENOUS

## 2018-03-30 MED ORDER — LACTATED RINGERS IV SOLN
INTRAVENOUS | Status: DC | PRN
  Administered 2018-03-30: 1000 mL

## 2018-03-30 MED ORDER — LACTATED RINGERS IV SOLN: INTRAVENOUS | Status: DC

## 2018-03-30 MED ORDER — ONDANSETRON HCL 4 MG/2ML IJ SOLN
INTRAMUSCULAR | Status: AC
  Filled 2018-03-30: qty 2

## 2018-03-30 MED ORDER — FENTANYL CITRATE (PF) 100 MCG/2ML IJ SOLN
INTRAMUSCULAR | Status: DC | PRN
  Administered 2018-03-30 (×4): 50 ug via INTRAVENOUS
  Administered 2018-03-30 (×2): 25 ug via INTRAVENOUS
  Administered 2018-03-30: 50 ug via INTRAVENOUS

## 2018-03-30 MED ORDER — HYDROMORPHONE HCL 1 MG/ML IJ SOLN
INTRAMUSCULAR | Status: AC
  Administered 2018-03-30: 0.25 mg via INTRAVENOUS
  Filled 2018-03-30: qty 1

## 2018-03-30 MED ORDER — SODIUM CHLORIDE 0.9 % IV BOLUS
1000.0000 mL | Freq: Once | INTRAVENOUS | Status: AC
  Administered 2018-03-30: 1000 mL via INTRAVENOUS

## 2018-03-30 MED ORDER — POTASSIUM CHLORIDE IN NACL 20-0.45 MEQ/L-% IV SOLN
INTRAVENOUS | Status: DC
  Administered 2018-03-30 – 2018-03-31 (×3): via INTRAVENOUS
  Filled 2018-03-30 (×3): qty 1000

## 2018-03-30 MED ORDER — STERILE WATER FOR IRRIGATION IR SOLN
Status: DC | PRN
  Administered 2018-03-30: 1000 mL

## 2018-03-30 MED ORDER — INSULIN ASPART 100 UNIT/ML ~~LOC~~ SOLN
0.0000 [IU] | SUBCUTANEOUS | Status: DC
  Administered 2018-03-30: 2 [IU] via SUBCUTANEOUS
  Administered 2018-03-30: 3 [IU] via SUBCUTANEOUS

## 2018-03-30 MED ORDER — ACETAMINOPHEN 10 MG/ML IV SOLN
1000.0000 mg | Freq: Once | INTRAVENOUS | Status: AC
  Administered 2018-03-30: 1000 mg via INTRAVENOUS

## 2018-03-30 MED ORDER — LABETALOL HCL 5 MG/ML IV SOLN
INTRAVENOUS | Status: AC
  Filled 2018-03-30: qty 4

## 2018-03-30 MED ORDER — ROCURONIUM BROMIDE 10 MG/ML (PF) SYRINGE
PREFILLED_SYRINGE | INTRAVENOUS | Status: DC | PRN
  Administered 2018-03-30 (×2): 10 mg via INTRAVENOUS
  Administered 2018-03-30: 50 mg via INTRAVENOUS
  Administered 2018-03-30 (×2): 10 mg via INTRAVENOUS
  Administered 2018-03-30: 20 mg via INTRAVENOUS
  Administered 2018-03-30: 10 mg via INTRAVENOUS

## 2018-03-30 MED ORDER — PROMETHAZINE HCL 25 MG/ML IJ SOLN: 6.2500 mg | INTRAMUSCULAR | Status: DC | PRN

## 2018-03-30 MED ORDER — ACETAMINOPHEN 10 MG/ML IV SOLN
INTRAVENOUS | Status: AC
  Filled 2018-03-30: qty 100

## 2018-03-30 MED ORDER — MIDAZOLAM HCL 2 MG/2ML IJ SOLN
INTRAMUSCULAR | Status: AC
  Filled 2018-03-30: qty 2

## 2018-03-30 MED ORDER — MAGNESIUM CITRATE PO SOLN: 1.0000 | Freq: Once | ORAL | Status: DC

## 2018-03-30 MED ORDER — KETOROLAC TROMETHAMINE 15 MG/ML IJ SOLN
INTRAMUSCULAR | Status: AC
  Filled 2018-03-30: qty 1

## 2018-03-30 SURGICAL SUPPLY — 51 items
APPLICATOR COTTON TIP 6 STRL (MISCELLANEOUS) ×2 IMPLANT
APPLICATOR COTTON TIP 6IN STRL (MISCELLANEOUS) ×3
CATH FOLEY 2WAY SLVR 18FR 30CC (CATHETERS) ×3 IMPLANT
CATH ROBINSON RED A/P 16FR (CATHETERS) ×3 IMPLANT
CATH ROBINSON RED A/P 8FR (CATHETERS) ×3 IMPLANT
CATH TIEMANN FOLEY 18FR 5CC (CATHETERS) ×3 IMPLANT
CHLORAPREP W/TINT 26ML (MISCELLANEOUS) ×3 IMPLANT
CLIP VESOLOCK LG 6/CT PURPLE (CLIP) ×6 IMPLANT
COVER SURGICAL LIGHT HANDLE (MISCELLANEOUS) ×3 IMPLANT
COVER TIP SHEARS 8 DVNC (MISCELLANEOUS) ×4 IMPLANT
COVER TIP SHEARS 8MM DA VINCI (MISCELLANEOUS) ×2
CUTTER ECHEON FLEX ENDO 45 340 (ENDOMECHANICALS) ×3 IMPLANT
DECANTER SPIKE VIAL GLASS SM (MISCELLANEOUS) ×3 IMPLANT
DERMABOND ADVANCED (GAUZE/BANDAGES/DRESSINGS) ×1
DERMABOND ADVANCED .7 DNX12 (GAUZE/BANDAGES/DRESSINGS) ×2 IMPLANT
DRAPE ARM DVNC X/XI (DISPOSABLE) ×8 IMPLANT
DRAPE COLUMN DVNC XI (DISPOSABLE) ×2 IMPLANT
DRAPE DA VINCI XI ARM (DISPOSABLE) ×4
DRAPE DA VINCI XI COLUMN (DISPOSABLE) ×1
DRAPE SURG IRRIG POUCH 19X23 (DRAPES) ×3 IMPLANT
DRSG TEGADERM 4X4.75 (GAUZE/BANDAGES/DRESSINGS) ×3 IMPLANT
ELECT PENCIL ROCKER SW 15FT (MISCELLANEOUS) ×3 IMPLANT
ELECT REM PT RETURN 15FT ADLT (MISCELLANEOUS) ×3 IMPLANT
GLOVE BIO SURGEON STRL SZ 6.5 (GLOVE) ×3 IMPLANT
GLOVE BIOGEL M STRL SZ7.5 (GLOVE) ×6 IMPLANT
GOWN STRL REUS W/TWL LRG LVL3 (GOWN DISPOSABLE) ×9 IMPLANT
HOLDER FOLEY CATH W/STRAP (MISCELLANEOUS) ×3 IMPLANT
IRRIG SUCT STRYKERFLOW 2 WTIP (MISCELLANEOUS) ×3
IRRIGATION SUCT STRKRFLW 2 WTP (MISCELLANEOUS) ×2 IMPLANT
NDL SAFETY ECLIPSE 18X1.5 (NEEDLE) ×2 IMPLANT
NEEDLE HYPO 18GX1.5 SHARP (NEEDLE) ×1
PACK ROBOT UROLOGY CUSTOM (CUSTOM PROCEDURE TRAY) ×3 IMPLANT
SEAL CANN UNIV 5-8 DVNC XI (MISCELLANEOUS) ×8 IMPLANT
SEAL XI 5MM-8MM UNIVERSAL (MISCELLANEOUS) ×4
SOLUTION ELECTROLUBE (MISCELLANEOUS) ×3 IMPLANT
STAPLE RELOAD 45 GRN (STAPLE) ×2 IMPLANT
STAPLE RELOAD 45MM GREEN (STAPLE) ×1
SUT ETHILON 3 0 PS 1 (SUTURE) ×3 IMPLANT
SUT MNCRL 3 0 RB1 (SUTURE) ×2 IMPLANT
SUT MNCRL 3 0 VIOLET RB1 (SUTURE) ×2 IMPLANT
SUT MNCRL AB 4-0 PS2 18 (SUTURE) ×6 IMPLANT
SUT MONOCRYL 3 0 RB1 (SUTURE) ×2
SUT VIC AB 0 CT1 27 (SUTURE) ×1
SUT VIC AB 0 CT1 27XBRD ANTBC (SUTURE) ×2 IMPLANT
SUT VIC AB 0 UR5 27 (SUTURE) ×3 IMPLANT
SUT VIC AB 2-0 SH 27 (SUTURE) ×1
SUT VIC AB 2-0 SH 27X BRD (SUTURE) ×2 IMPLANT
SUT VICRYL 0 UR6 27IN ABS (SUTURE) ×6 IMPLANT
SYR 27GX1/2 1ML LL SAFETY (SYRINGE) ×3 IMPLANT
TOWEL OR 17X26 10 PK STRL BLUE (TOWEL DISPOSABLE) ×3 IMPLANT
TOWEL OR NON WOVEN STRL DISP B (DISPOSABLE) ×3 IMPLANT

## 2018-03-30 NOTE — Anesthesia Preprocedure Evaluation (Addendum)
Anesthesia Evaluation  Patient identified by MRN, date of birth, ID band Patient awake    Reviewed: Allergy & Precautions, NPO status , Patient's Chart, lab work & pertinent test results  Airway Mallampati: III  TM Distance: >3 FB Neck ROM: Full    Dental  (+) Teeth Intact, Dental Advisory Given, Chipped,    Pulmonary neg pulmonary ROS,    Pulmonary exam normal        Cardiovascular negative cardio ROS   Rhythm:Regular Rate:Normal     Neuro/Psych negative neurological ROS  negative psych ROS   GI/Hepatic negative GI ROS, Neg liver ROS,   Endo/Other  diabetes, Type 2, Oral Hypoglycemic Agents  Renal/GU      Musculoskeletal negative musculoskeletal ROS (+)   Abdominal (+) + obese,   Peds  Hematology   Anesthesia Other Findings   Reproductive/Obstetrics                            Lab Results  Component Value Date   WBC 5.7 03/23/2018   HGB 14.8 03/23/2018   HCT 45.3 03/23/2018   MCV 84.0 03/23/2018   PLT 237 03/23/2018   Lab Results  Component Value Date   CREATININE 1.21 03/23/2018   BUN 16 03/23/2018   NA 140 03/23/2018   K 4.5 03/23/2018   CL 105 03/23/2018   CO2 29 03/23/2018   Lab Results  Component Value Date   INR 1.1 10/03/2008   INR 1.0 10/02/2008   EKG: normal sinus rhythm.  Anesthesia Physical Anesthesia Plan  ASA: II  Anesthesia Plan: General   Post-op Pain Management:    Induction: Intravenous  PONV Risk Score and Plan: 3 and Ondansetron, Dexamethasone and Midazolam  Airway Management Planned: Oral ETT  Additional Equipment: None  Intra-op Plan:   Post-operative Plan: Extubation in OR  Informed Consent: I have reviewed the patients History and Physical, chart, labs and discussed the procedure including the risks, benefits and alternatives for the proposed anesthesia with the patient or authorized representative who has indicated his/her  understanding and acceptance.   Dental advisory given  Plan Discussed with: CRNA  Anesthesia Plan Comments:        Anesthesia Quick Evaluation

## 2018-03-30 NOTE — Anesthesia Procedure Notes (Addendum)
Procedure Name: Intubation Date/Time: 03/30/2018 7:32 AM Performed by: Dione Booze, CRNA Pre-anesthesia Checklist: Suction available, Patient being monitored, Emergency Drugs available and Patient identified Patient Re-evaluated:Patient Re-evaluated prior to induction Oxygen Delivery Method: Circle system utilized Preoxygenation: Pre-oxygenation with 100% oxygen Induction Type: IV induction Ventilation: Mask ventilation without difficulty and Oral airway inserted - appropriate to patient size Laryngoscope Size: Mac and 4 Grade View: Grade I Tube type: Oral Tube size: 7.5 mm Number of attempts: 1 Airway Equipment and Method: Stylet Placement Confirmation: ETT inserted through vocal cords under direct vision,  positive ETCO2 and breath sounds checked- equal and bilateral Secured at: 22 cm Tube secured with: Tape Dental Injury: Teeth and Oropharynx as per pre-operative assessment  Comments: BS listened to by Hatcher([place] starting other case). Small discoloration Lt  Upper tooth. No change with laryngoscopy/intubation.

## 2018-03-30 NOTE — Transfer of Care (Signed)
Immediate Anesthesia Transfer of Care Note  Patient: Nathan [name]  Procedure(s) Performed: XI ROBOTIC ASSISTED LAPAROSCOPIC RADICAL PROSTATECTOMY LEVEL 3 (N/A ) LYMPHADENECTOMY, PELVIC (Bilateral )  Patient Location: PACU  Anesthesia Type:General  Level of Consciousness: awake, drowsy and patient cooperative  Airway & Oxygen Therapy: Patient Spontanous Breathing and Patient connected to face mask oxygen  Post-op Assessment: Report given to RN and Post -op Vital signs reviewed and stable  Post vital signs: Reviewed and stable  Last Vitals:  Vitals Value Taken Time  BP 131/88 03/30/2018 12:05 PM  Temp    Pulse 93 03/30/2018 12:07 PM  Resp 15 03/30/2018 12:07 PM  SpO2 100 % 03/30/2018 12:07 PM  Vitals shown include unvalidated device data.  Last Pain:  Vitals:   03/30/18 0605  TempSrc:   PainSc: 0-No pain      Patients Stated Pain Goal: 3 (89/37/34 2876)  Complications: No apparent anesthesia complications

## 2018-03-30 NOTE — Discharge Instructions (Signed)

## 2018-03-30 NOTE — Progress Notes (Signed)
Patient ID: Nathan [name], male   DOB: 1950-10-07, 67 y.o.   MRN: [id number]  Post-op note  Subjective: The patient is doing well.  No complaints.  Objective: Vital signs in last 24 hours: Temp:  [97.7 F (36.5 C)-99 F (37.2 C)] 99 F (37.2 C) (08/05 1348) Pulse Rate:  [91-100] 100 (08/05 1348) Resp:  [12-20] 20 (08/05 1348) BP: (104-140)/(84-94) 132/84 (08/05 1348) SpO2:  [97 %-100 %] 99 % (08/05 1348) Weight:  [102.1 kg (225 lb)-102.4 kg (225 lb 11.2 oz)] 102.4 kg (225 lb 11.2 oz) (08/05 1351)  Intake/Output from previous day: No intake/output data recorded. Intake/Output this shift: Total I/O In: 2920.3 [I.V.:1720.3; IV Piggyback:1200] Out: 840 [Urine:600; Drains:90; Blood:150]  Physical Exam:  General: Alert and oriented. Abdomen: Soft, Nondistended. Incisions: Clean and dry. GU: Urine clear.  Lab Results: Recent Labs    03/30/18 1211  HGB 13.7  HCT 42.1    Assessment/Plan: POD#0   1) Continue to monitor, ambulate, IS   Pryor Curia. MD   LOS: 0 days   Ruben Mahler,LES 03/30/2018, 6:52 PM

## 2018-03-30 NOTE — Op Note (Signed)
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 Robotic assisted laparoscopic radical prostatectomy (Unilateral right nerve sparing) 2. Bilateral robotic assisted laparoscopic extended pelvic lymphadenectomy  Surgeon: Pryor Curia. M.D.  Assistant(s): Debbrah Alar, PA-C  An assistant was required for this surgical procedure.  The duties of the assistant included but were not limited to suctioning, passing suture, camera manipulation, retraction. This procedure would not be able to be performed without an Environmental consultant.  Resident: Dr. Lewie Loron  Anesthesia: General  Complications: None  EBL: 150 mL  IVF:  1000 mL crystalloid  Specimens: 1. Prostate and seminal vesicles 2. Right pelvic lymph nodes 3. Left pelvic lymph nodes  Disposition of specimens: Pathology  Drains: 1. 20 Fr coude catheter 2. # 19 Blake pelvic drain  Indication: Nathan [name] is a 67 y.o. patient with clinically localized prostate cancer.  After a thorough review of the management options for treatment of prostate cancer, he elected to proceed with surgical therapy and the above procedure(s).  We have discussed the potential benefits and risks of the procedure, side effects of the proposed treatment, the likelihood of the patient achieving the goals of the procedure, and any potential problems that might occur during the procedure or recuperation. Informed consent has been obtained.  Description of procedure:  The patient was taken to the operating room and a general anesthetic was administered. He was given preoperative antibiotics, placed in the dorsal lithotomy position, and prepped and draped in the usual sterile fashion. Next a preoperative timeout was performed. A urethral catheter was placed into the bladder and a site  was selected near the umbilicus for placement of the camera port. This was placed using a standard open Hassan technique which allowed entry into the peritoneal cavity under direct vision and without difficulty. An 8 mm port was placed and a pneumoperitoneum established. The camera was then used to inspect the abdomen and there was no evidence of any intra-abdominal injuries or other abnormalities. The remaining abdominal ports were then placed. 8 mm robotic ports were placed in the right lower quadrant, left lower quadrant, and far left lateral abdominal wall. A 5 mm port was placed in the right upper quadrant and a 12 mm port was placed in the right lateral abdominal wall for laparoscopic assistance. All ports were placed under direct vision without difficulty. The surgical cart was then docked.   Utilizing the cautery scissors, the bladder was reflected posteriorly allowing entry into the space of Retzius and identification of the endopelvic fascia and prostate. The periprostatic fat was then removed from the prostate allowing full exposure of the endopelvic fascia. The endopelvic fascia was then incised from the apex back to the base of the prostate bilaterally and the underlying levator muscle fibers were swept laterally off the prostate thereby isolating the dorsal venous complex. The dorsal vein was then stapled and divided with a 45 mm Flex Echelon stapler. Attention then turned to the bladder neck which was divided anteriorly thereby allowing entry into the bladder and exposure of the urethral catheter. The catheter balloon was deflated and the catheter was brought into the operative field and used to retract the prostate anteriorly. The posterior bladder neck was then examined and was divided allowing further dissection between the bladder and prostate posteriorly until the vasa deferentia and seminal vessels were identified. The vasa deferentia were isolated,  divided, and lifted anteriorly. The seminal  vesicles were dissected down to their tips with care to control the seminal vascular arterial blood supply. These structures were then lifted anteriorly and the space between Denonvillier's fascia and the anterior rectum was developed with a combination of sharp and blunt dissection. This isolated the vascular pedicles of the prostate.  The lateral prostatic fascia on the right side of the prostate was then sharply incised allowing release of the neurovascular bundle. The vascular pedicle of the prostate on the right side was then ligated with Weck clips between the prostate and neurovascular bundle and divided with sharp cold scissor dissection resulting in neurovascular bundle preservation. On the left side, a wide non nerve sparing dissection was performed with Weck clips used to ligate the vascular pedicle of the prostate. The neurovascular bundle on the right side was then separated off the apex of the prostate and urethra.  The urethra was then sharply transected allowing the prostate specimen to be disarticulated. The pelvis was copiously irrigated and hemostasis was ensured. There was no evidence for rectal injury.  Attention then turned to the right pelvic sidewall. The fibrofatty tissue extending from the genitofemoral nerve laterally to the confluence of the iliac vessels proximally to the hypogastric artery posteriorly and Cooper's ligament distally was dissected free from the pelvic sidewall with care to preserve the obturator nerve and major vascular structures. Weck clips were used for lymphostasis and hemostasis. An identical procedure was then performed on the contralateral side and the lymphatic packets were removed for permanent pathologic analysis.  Attention then turned to the urethral anastomosis. A 2-0 Vicryl slip knot was placed between Denonvillier's fascia, the posterior bladder neck, and the posterior urethra to reapproximate these structures. A double-armed 3-0 Monocryl suture was  then used to perform a 360 running tension-free anastomosis between the bladder neck and urethra. A new urethral catheter was then placed into the bladder and irrigated. There were no blood clots within the bladder and the anastomosis appeared to be watertight. A #19 Blake drain was then brought through the left lateral 8 mm port site and positioned appropriately within the pelvis. It was secured to the skin with a nylon suture. The surgical cart was then undocked. The right lateral 12 mm port site was closed at the fascial level with a 0 Vicryl suture placed laparoscopically. All remaining ports were then removed under direct vision. The prostate specimen was removed intact within the Endopouch retrieval bag via the periumbilical camera port site. This fascial opening was closed with two running 0 Vicryl sutures. 0.25% Marcaine was then injected into all port sites and all incisions were reapproximated at the skin level with 4-0 Monocryl subcuticular sutures. Dermabond was applied. The patient appeared to tolerate the procedure well and without complications. The patient was able to be extubated and transferred to the recovery unit in satisfactory condition.  Pryor Curia MD

## 2018-03-30 NOTE — Anesthesia Postprocedure Evaluation (Signed)
Anesthesia Post Note  Patient: Micael L [place]  Procedure(s) Performed: XI ROBOTIC ASSISTED LAPAROSCOPIC RADICAL PROSTATECTOMY LEVEL 3 (N/A ) LYMPHADENECTOMY, PELVIC (Bilateral )     Patient location during evaluation: PACU Anesthesia Type: General Level of consciousness: awake and alert Pain management: pain level controlled Vital Signs Assessment: post-procedure vital signs reviewed and stable Respiratory status: spontaneous breathing, nonlabored ventilation, respiratory function stable and patient connected to nasal cannula oxygen Cardiovascular status: blood pressure returned to baseline and stable Postop Assessment: no apparent nausea or vomiting Anesthetic complications: no    Last Vitals:  Vitals:   03/30/18 1245 03/30/18 1300  BP: 132/86 138/89  Pulse: 93 93  Resp: 14 17  Temp:    SpO2: 100% 100%    Last Pain:  Vitals:   03/30/18 1245  TempSrc:   PainSc: [place] Niklas Chretien

## 2018-03-31 ENCOUNTER — Encounter (HOSPITAL_COMMUNITY): Payer: Self-pay | Admitting: Urology

## 2018-03-31 ENCOUNTER — Encounter: Payer: Self-pay | Admitting: Medical Oncology

## 2018-03-31 DIAGNOSIS — C61 Malignant neoplasm of prostate: Secondary | ICD-10-CM | POA: Diagnosis not present

## 2018-03-31 LAB — GLUCOSE, CAPILLARY
GLUCOSE-CAPILLARY: 115 mg/dL — AB (ref 70–99)
Glucose-Capillary: 117 mg/dL — ABNORMAL HIGH (ref 70–99)
Glucose-Capillary: 141 mg/dL — ABNORMAL HIGH (ref 70–99)
Glucose-Capillary: 107 mg/dL — ABNORMAL HIGH (ref 70–99)

## 2018-03-31 LAB — HEMOGLOBIN AND HEMATOCRIT, BLOOD
HEMATOCRIT: 40.6 % (ref 39.0–52.0)
Hemoglobin: 13.3 g/dL (ref 13.0–17.0)

## 2018-03-31 MED ORDER — TRAMADOL HCL 50 MG PO TABS
50.0000 mg | ORAL_TABLET | Freq: Four times a day (QID) | ORAL | Status: DC | PRN
  Administered 2018-03-31: 50 mg via ORAL
  Filled 2018-03-31: qty 1

## 2018-03-31 MED ORDER — BISACODYL 10 MG RE SUPP
10.0000 mg | Freq: Once | RECTAL | Status: AC
  Administered 2018-03-31: 10 mg via RECTAL
  Filled 2018-03-31: qty 1

## 2018-03-31 MED ORDER — BISACODYL 10 MG RE SUPP: 10.0000 mg | Freq: Once | RECTAL | Status: AC

## 2018-03-31 NOTE — Progress Notes (Signed)
Patient is stable for discharge. Discharge instructions and medications have been reviewed with the patient and all questions answered. AVS and prescriptions given to patient. Provided patient with foley and leg bag teaching. Patient verbalized and demonstrated understanding.   Maysie Parkhill, Fraser Din 03/31/2018

## 2018-03-31 NOTE — Progress Notes (Signed)
Urology Progress Note   1 Day Post-Op  Subjective: NAEON AFVSS Pain well controlled, does complain of bloating UOP adequate Drain output 40 mL overnight Tolerating CLD Flatus x1 Ambulating  Objective: Vital signs in last 24 hours: Temp:  [98 F (36.7 C)-99 F (37.2 C)] 98.6 F (37 C) (08/06 0406) Pulse Rate:  [85-100] 85 (08/06 0406) Resp:  [12-20] 16 (08/06 0406) BP: (106-140)/(68-94) 106/68 (08/06 0406) SpO2:  [96 %-100 %] 96 % (08/06 0406) Weight:  [102.4 kg (225 lb 11.2 oz)] 102.4 kg (225 lb 11.2 oz) (08/05 1351)  Intake/Output from previous day: 08/05 0701 - 08/06 0700 In: 4568.8 [P.O.:240; I.V.:3128.8; IV Piggyback:1200] Out: 2610 [Urine:2300; Drains:160; Blood:150] Intake/Output this shift: Total I/O In: 1648.6 [P.O.:240; I.V.:1408.6] Out: 1770 [Urine:1700; Drains:70]  Physical Exam:  General: Alert and oriented CV: RRR Lungs: Clear Abdomen: Soft, appropriately tender. Incisions c/d/i GU: Foley in place draining clear yellow urine Ext: NT, No erythema  Lab Results: Recent Labs    03/30/18 1211 03/31/18 0507  HGB 13.7 13.3  HCT 42.1 40.6   BMET No results for input(s): NA, K, CL, CO2, GLUCOSE, BUN, CREATININE, CALCIUM in the last 72 hours.   Studies/Results: No results found.  Assessment/Plan:  67 y.o. male s/p robotic prostatectomy.  Overall doing well post-op.   1) SL IVF 2) Ambulate, Incentive spirometry 3) Transition to oral pain medication 4) Dulcolax suppository 5) D/C pelvic drain 6) Plan for likely discharge later today  Dispo: Floor   LOS: 0 days   Gladyce Mcray Rob Bunting 03/31/2018, 6:58 AM

## 2018-03-31 NOTE — Discharge Summary (Signed)
  Date of admission: 03/30/2018  Date of discharge: 03/31/2018  Admission diagnosis: Prostate Cancer  Discharge diagnosis: Prostate Cancer  History and Physical: For full details, please see admission history and physical. Briefly, Nathan [name] is a 67 y.o. gentleman with localized prostate cancer.  After discussing management/treatment options, he elected to proceed with surgical treatment.  Hospital Course: Nathan [name] was taken to the operating room on 03/30/2018 and underwent a robotic assisted laparoscopic radical prostatectomy. He tolerated this procedure well and without complications. Postoperatively, he was able to be transferred to a regular hospital room following recovery from anesthesia.  He was able to begin ambulating the night of surgery. He remained hemodynamically stable overnight.  He had excellent urine output with appropriately minimal output from his pelvic drain and his pelvic drain was removed on POD #1.  He was transitioned to oral pain medication, tolerated a clear liquid diet, and had met all discharge criteria and was able to be discharged home later on POD#1.  Laboratory values:  Recent Labs    03/30/18 1211 03/31/18 0507  HGB 13.7 13.3  HCT 42.1 40.6    Disposition: Home  Discharge instruction: He was instructed to be ambulatory but to refrain from heavy lifting, strenuous activity, or driving. He was instructed on urethral catheter care.  Discharge medications:   Allergies as of 03/31/2018   No Known Allergies     Medication List    STOP taking these medications   ibuprofen 200 MG tablet Commonly known as:  ADVIL,MOTRIN     TAKE these medications   rosuvastatin 20 MG tablet Commonly known as:  CRESTOR Take 20 mg by mouth at bedtime.   sitaGLIPtin-metformin 50-1000 MG tablet Commonly known as:  JANUMET Take 1 tablet by mouth 2 (two) times daily with a meal.   sulfamethoxazole-trimethoprim 800-160 MG tablet Commonly known as:  BACTRIM  DS,SEPTRA DS Take 1 tablet by mouth 2 (two) times daily. Start the day prior to foley removal appointment   traMADol 50 MG tablet Commonly known as:  ULTRAM Take 1-2 tablets (50-100 mg total) by mouth every 6 (six) hours as needed for moderate pain or severe pain.   WAL-TUSSIN 100 MG/5ML syrup Generic drug:  guaifenesin Take 200 mg by mouth 3 (three) times daily as needed for cough or congestion.       Followup: He will followup in 1 week for catheter removal and to discuss his surgical pathology results.

## 2018-03-31 NOTE — Progress Notes (Signed)
Spoke with Nathan [name] and he states he is doing very well. He is post op robotic prostatectomy. He is ready to be discharged and will follow up with Dr. Alinda Money in a week for catheter removal and to discuss his pathology. I wished him well and asked him to call  With concerns or questions.

## 2018-04-08 MED FILL — JANUMET 50-1,000 MG TABLET: 50-1000 | 30 days supply | Qty: 60 | Fill #4

## 2018-04-30 DIAGNOSIS — M62838 Other muscle spasm: Secondary | ICD-10-CM | POA: Diagnosis not present

## 2018-04-30 DIAGNOSIS — N393 Stress incontinence (female) (male): Secondary | ICD-10-CM | POA: Diagnosis not present

## 2018-04-30 DIAGNOSIS — M6281 Muscle weakness (generalized): Secondary | ICD-10-CM | POA: Diagnosis not present

## 2018-05-13 DIAGNOSIS — M62838 Other muscle spasm: Secondary | ICD-10-CM | POA: Diagnosis not present

## 2018-05-13 DIAGNOSIS — M6281 Muscle weakness (generalized): Secondary | ICD-10-CM | POA: Diagnosis not present

## 2018-05-13 DIAGNOSIS — N393 Stress incontinence (female) (male): Secondary | ICD-10-CM | POA: Diagnosis not present

## 2018-05-29 DIAGNOSIS — C61 Malignant neoplasm of prostate: Secondary | ICD-10-CM | POA: Diagnosis not present

## 2018-06-01 DIAGNOSIS — Z8546 Personal history of malignant neoplasm of prostate: Secondary | ICD-10-CM | POA: Insufficient documentation

## 2018-06-01 DIAGNOSIS — E119 Type 2 diabetes mellitus without complications: Secondary | ICD-10-CM | POA: Diagnosis not present

## 2018-06-01 DIAGNOSIS — E785 Hyperlipidemia, unspecified: Secondary | ICD-10-CM | POA: Diagnosis not present

## 2018-06-01 DIAGNOSIS — E669 Obesity, unspecified: Secondary | ICD-10-CM | POA: Diagnosis not present

## 2018-06-01 DIAGNOSIS — D72819 Decreased white blood cell count, unspecified: Secondary | ICD-10-CM | POA: Diagnosis not present

## 2018-06-01 MED FILL — JANUMET 50-1,000 MG TABLET: 50-1000 | 30 days supply | Qty: 60 | Fill #5

## 2018-06-06 DIAGNOSIS — Z9079 Acquired absence of other genital organ(s): Secondary | ICD-10-CM | POA: Diagnosis not present

## 2018-06-10 ENCOUNTER — Inpatient Hospital Stay (HOSPITAL_COMMUNITY)
Admission: EM | Admit: 2018-06-10 | Discharge: 2018-06-12 | DRG: 312 | Disposition: A | Discharge status: Home / Self Care | Payer: PPO | Attending: Internal Medicine | Admitting: Internal Medicine

## 2018-06-10 ENCOUNTER — Emergency Department (HOSPITAL_COMMUNITY): Payer: PPO

## 2018-06-10 ENCOUNTER — Encounter (HOSPITAL_COMMUNITY): Payer: Self-pay | Admitting: Emergency Medicine

## 2018-06-10 ENCOUNTER — Other Ambulatory Visit: Payer: Self-pay

## 2018-06-10 DIAGNOSIS — Z9079 Acquired absence of other genital organ(s): Secondary | ICD-10-CM

## 2018-06-10 DIAGNOSIS — R000 Tachycardia, unspecified: Secondary | ICD-10-CM | POA: Diagnosis present

## 2018-06-10 DIAGNOSIS — R42 Dizziness and giddiness: Secondary | ICD-10-CM

## 2018-06-10 DIAGNOSIS — C61 Malignant neoplasm of prostate: Secondary | ICD-10-CM | POA: Diagnosis present

## 2018-06-10 DIAGNOSIS — E119 Type 2 diabetes mellitus without complications: Secondary | ICD-10-CM | POA: Diagnosis present

## 2018-06-10 DIAGNOSIS — Z9852 Vasectomy status: Secondary | ICD-10-CM | POA: Diagnosis not present

## 2018-06-10 DIAGNOSIS — H547 Unspecified visual loss: Secondary | ICD-10-CM | POA: Diagnosis present

## 2018-06-10 DIAGNOSIS — H5500 Unspecified nystagmus: Secondary | ICD-10-CM | POA: Diagnosis present

## 2018-06-10 DIAGNOSIS — Z7984 Long term (current) use of oral hypoglycemic drugs: Secondary | ICD-10-CM

## 2018-06-10 DIAGNOSIS — Z86010 Personal history of colonic polyps: Secondary | ICD-10-CM | POA: Diagnosis not present

## 2018-06-10 DIAGNOSIS — E860 Dehydration: Secondary | ICD-10-CM | POA: Diagnosis present

## 2018-06-10 DIAGNOSIS — R55 Syncope and collapse: Principal | ICD-10-CM | POA: Diagnosis present

## 2018-06-10 DIAGNOSIS — Z79899 Other long term (current) drug therapy: Secondary | ICD-10-CM | POA: Diagnosis not present

## 2018-06-10 DIAGNOSIS — I10 Essential (primary) hypertension: Secondary | ICD-10-CM | POA: Diagnosis present

## 2018-06-10 DIAGNOSIS — R51 Headache: Secondary | ICD-10-CM | POA: Diagnosis not present

## 2018-06-10 DIAGNOSIS — I5030 Unspecified diastolic (congestive) heart failure: Secondary | ICD-10-CM | POA: Diagnosis not present

## 2018-06-10 DIAGNOSIS — E785 Hyperlipidemia, unspecified: Secondary | ICD-10-CM | POA: Diagnosis present

## 2018-06-10 LAB — BASIC METABOLIC PANEL
Anion gap: 9 (ref 5–15)
BUN: 11 mg/dL (ref 8–23)
CHLORIDE: 102 mmol/L (ref 98–111)
CO2: 27 mmol/L (ref 22–32)
CREATININE: 1.07 mg/dL (ref 0.61–1.24)
Calcium: 9.7 mg/dL (ref 8.9–10.3)
GFR calc Af Amer: >60 mL/min (ref >60)
GFR calc non Af Amer: >60 mL/min (ref >60)
Glucose, Bld: 159 mg/dL — ABNORMAL HIGH (ref 70–99)
Potassium: 4.2 mmol/L (ref 3.5–5.1)
SODIUM: 138 mmol/L (ref 135–145)

## 2018-06-10 LAB — URINALYSIS, ROUTINE W REFLEX MICROSCOPIC
BILIRUBIN URINE: NEGATIVE
GLUCOSE, UA: NEGATIVE mg/dL
Hgb urine dipstick: NEGATIVE
KETONES UR: NEGATIVE mg/dL
Leukocytes, UA: NEGATIVE
NITRITE: NEGATIVE
PH: 7 (ref 5.0–8.0)
Protein, ur: NEGATIVE mg/dL
Specific Gravity, Urine: 1.013 (ref 1.005–1.030)

## 2018-06-10 LAB — GLUCOSE, CAPILLARY
Glucose-Capillary: 132 mg/dL — ABNORMAL HIGH (ref 70–99)
Glucose-Capillary: 270 mg/dL — ABNORMAL HIGH (ref 70–99)

## 2018-06-10 LAB — CBC
HEMATOCRIT: 47 % (ref 39.0–52.0)
Hemoglobin: 14.4 g/dL (ref 13.0–17.0)
MCH: 26.3 pg (ref 26.0–34.0)
MCHC: 30.6 g/dL (ref 30.0–36.0)
MCV: 85.8 fL (ref 80.0–100.0)
Platelets: 278 10*3/uL (ref 150–400)
RBC: 5.48 MIL/uL (ref 4.22–5.81)
RDW: 12.6 % (ref 11.5–15.5)
WBC: 4.7 10*3/uL (ref 4.0–10.5)
nRBC: 0 % (ref 0.0–0.2)

## 2018-06-10 LAB — D-DIMER, QUANTITATIVE: D-Dimer, Quant: 0.27 ug/mL-FEU (ref 0.00–0.50)

## 2018-06-10 LAB — TROPONIN I

## 2018-06-10 LAB — TSH: TSH: 0.656 u[IU]/mL (ref 0.350–4.500)

## 2018-06-10 LAB — CBG MONITORING, ED: GLUCOSE-CAPILLARY: 140 mg/dL — AB (ref 70–99)

## 2018-06-10 MED ORDER — ACETAMINOPHEN 325 MG PO TABS: 650.0000 mg | ORAL_TABLET | Freq: Four times a day (QID) | ORAL | Status: DC | PRN

## 2018-06-10 MED ORDER — ENOXAPARIN SODIUM 40 MG/0.4ML ~~LOC~~ SOLN
40.0000 mg | SUBCUTANEOUS | Status: DC
  Filled 2018-06-10: qty 0.4

## 2018-06-10 MED ORDER — IOPAMIDOL (ISOVUE-370) INJECTION 76%
INTRAVENOUS | Status: AC
  Filled 2018-06-10: qty 50

## 2018-06-10 MED ORDER — LACTATED RINGERS IV SOLN
INTRAVENOUS | Status: DC
  Administered 2018-06-10 – 2018-06-12 (×3): via INTRAVENOUS

## 2018-06-10 MED ORDER — MORPHINE SULFATE (PF) 2 MG/ML IV SOLN: 2.0000 mg | INTRAVENOUS | Status: DC | PRN

## 2018-06-10 MED ORDER — ASPIRIN EC 81 MG PO TBEC
81.0000 mg | DELAYED_RELEASE_TABLET | Freq: Every day | ORAL | Status: DC
  Administered 2018-06-10 – 2018-06-11 (×2): 81 mg via ORAL
  Filled 2018-06-10 (×3): qty 1

## 2018-06-10 MED ORDER — ACETAMINOPHEN 650 MG RE SUPP: 650.0000 mg | Freq: Four times a day (QID) | RECTAL | Status: DC | PRN

## 2018-06-10 MED ORDER — ONDANSETRON HCL 4 MG/2ML IJ SOLN: 4.0000 mg | Freq: Four times a day (QID) | INTRAMUSCULAR | Status: DC | PRN

## 2018-06-10 MED ORDER — SODIUM CHLORIDE 0.9% FLUSH
3.0000 mL | Freq: Two times a day (BID) | INTRAVENOUS | Status: DC
  Administered 2018-06-10 – 2018-06-11 (×2): 3 mL via INTRAVENOUS

## 2018-06-10 MED ORDER — INSULIN ASPART 100 UNIT/ML ~~LOC~~ SOLN
0.0000 [IU] | Freq: Every day | SUBCUTANEOUS | Status: DC
  Administered 2018-06-10: 5 [IU] via SUBCUTANEOUS
  Filled 2018-06-10: qty 1

## 2018-06-10 MED ORDER — ONDANSETRON HCL 4 MG PO TABS: 4.0000 mg | ORAL_TABLET | Freq: Four times a day (QID) | ORAL | Status: DC | PRN

## 2018-06-10 MED ORDER — IOPAMIDOL (ISOVUE-370) INJECTION 76%
100.0000 mL | Freq: Once | INTRAVENOUS | Status: AC | PRN
  Administered 2018-06-10: 50 mL via INTRAVENOUS

## 2018-06-10 MED ORDER — INSULIN ASPART 100 UNIT/ML ~~LOC~~ SOLN
0.0000 [IU] | Freq: Three times a day (TID) | SUBCUTANEOUS | Status: DC
  Administered 2018-06-10: 2 [IU] via SUBCUTANEOUS
  Administered 2018-06-11: 3 [IU] via SUBCUTANEOUS
  Filled 2018-06-10 (×2): qty 1

## 2018-06-10 NOTE — ED Provider Notes (Signed)
[facility] EMERGENCY DEPARTMENT Provider Note   CSN: [id number] Arrival date & time: 06/10/18  9798     History   Chief Complaint Chief Complaint  Patient presents with  . Dizziness    HPI ENDY EASTERLY is a 67 y.o. male.  HPI Patient states that over the last week he has had greater than 10 episodes of loss of bilateral vision and brief syncope.  States that it starts as a rushing sensation to the left side of his neck and head.  Does not seem to be associated with position changes.  He denies any chest pain or shortness of breath.  No recent fever or chills.  No nausea or vomiting.  No recent head trauma.  Denies any focal weakness or numbness.  Patient is 2 months out from laparoscopic prostatectomy.  No complications from procedure. Past Medical History:  Diagnosis Date  . Diabetes mellitus without complication ([place])    type 2  . H/O cardiac catheterization   . Hyperlipidemia   . Malignant neoplasm of prostate ([place]) 02/05/2018    Patient Active Problem List   Diagnosis Date Noted  . Prostate cancer ([place]) 03/30/2018  . Malignant neoplasm of prostate ([place]) 02/05/2018  . Right hip pain 06/25/2016  . Bilateral knee pain 02/23/2016  . Controlled type 2 diabetes mellitus without complication ([place]) 92/06/9416  . 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 Adiposity 08/01/2015  . Creatinine elevation 08/01/2015  . H/O adenomatous polyp of colon 08/01/2015  . Elevated CK 08/01/2015  . 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 Diabetes ([place]) 06/23/2015  . Hyperlipidemia 06/23/2015    Past Surgical History:  Procedure Laterality Date  . colonscopy    . LYMPHADENECTOMY Bilateral 03/30/2018   Procedure: LYMPHADENECTOMY, PELVIC;  Surgeon: Raynelle Bring, MD;  Location: [facility];  Service: Urology;  Laterality: Bilateral;  . PROSTATE BIOPSY  01/2018  . ROBOT ASSISTED LAPAROSCOPIC RADICAL PROSTATECTOMY N/A 03/30/2018   Procedure: XI ROBOTIC  ASSISTED LAPAROSCOPIC RADICAL PROSTATECTOMY LEVEL 3;  Surgeon: Raynelle Bring, MD;  Location: [facility];  Service: Urology;  Laterality: N/A;  ONLY NEEDS 210 MIN TOTAL FOR PROCEDURE  . vasedtomy  1986        Home Medications    Prior to Admission medications   Medication Sig Start Date End Date Taking? Authorizing Provider  rosuvastatin (CRESTOR) 20 MG tablet Take 20 mg by mouth at bedtime.    Yes [provider]  sitaGLIPtin-metformin (JANUMET) 50-1000 MG tablet Take 1 tablet by mouth 2 (two) times daily with a meal.   Yes [provider]  traMADol (ULTRAM) 50 MG tablet Take 1-2 tablets (50-100 mg total) by mouth every 6 (six) hours as needed for moderate pain or severe pain. Patient not taking: Reported on 06/10/2018 03/30/18   Debbrah Alar, PA-C    Family History Family History  Problem Relation Age of Onset  . Cancer Mother     Social History Social History   Tobacco Use  . Smoking status: Never Smoker  . Smokeless tobacco: Never Used  Substance Use Topics  . Alcohol use: Never    Alcohol/week: 0.0 standard drinks    Frequency: Never  . Drug use: Never     Allergies   Patient has no known allergies.   Review of Systems Review of Systems  Constitutional: Negative for chills and fever.  HENT: Negative for sore throat and trouble swallowing.   Eyes: Positive for visual disturbance. Negative for pain.  Respiratory: Negative  for cough and shortness of breath.   Cardiovascular: Negative for chest pain and palpitations.  Gastrointestinal: Negative for abdominal pain, nausea and vomiting.  Genitourinary: Negative for dysuria, flank pain, frequency and hematuria.  Musculoskeletal: Negative for back pain, myalgias and neck pain.  Skin: Negative for rash.  Neurological: Positive for dizziness, syncope, weakness and light-headedness. Negative for speech difficulty, numbness and headaches.  All other systems reviewed and are negative.    Physical  Exam Updated Vital Signs BP (!) 126/94   Pulse (!) 106   Temp 98.5 F (36.9 C) (Oral)   Resp (!) 23   SpO2 97%   Physical Exam  Constitutional: He is oriented to person, place, and time. He appears well-developed and well-nourished. No distress.  HENT:  Head: Normocephalic and atraumatic.  Mouth/Throat: Oropharynx is clear and moist. No oropharyngeal exudate.  Eyes: Pupils are equal, round, and reactive to light. EOM are normal.  Few beats of horizontal nystagmus.  Neck: Normal range of motion. Neck supple. No JVD present.  No bruits  Cardiovascular: Normal rate and regular rhythm. Exam reveals no gallop and no friction rub.  No murmur heard. Pulmonary/Chest: Effort normal and breath sounds normal. No stridor. No respiratory distress. He has no wheezes. He has no rales. He exhibits no tenderness.  Abdominal: Soft. Bowel sounds are normal. There is no tenderness. There is no rebound and no guarding.  Musculoskeletal: Normal range of motion. He exhibits no edema or tenderness.  Lymphadenopathy:    He has no cervical adenopathy.  Neurological: He is alert and oriented to person, place, and time.  Patient is alert and oriented x3 with clear, goal oriented speech. Patient has 5/5 motor in all extremities. Sensation is intact to light touch.  Skin: Skin is warm and dry. Capillary refill takes less than 2 seconds. No rash noted. He is not diaphoretic. No erythema.  Psychiatric:  Anxious appearing  Nursing note and vitals reviewed.    ED Treatments / Results  Labs (all labs ordered are listed, but only abnormal results are displayed) Labs Reviewed  BASIC METABOLIC PANEL - Abnormal; Notable for the following components:      Result Value   Glucose, Bld 159 (*)    All other components within normal limits  CBG MONITORING, ED - Abnormal; Notable for the following components:   Glucose-Capillary 140 (*)    All other components within normal limits  CBC  URINALYSIS, ROUTINE W REFLEX  MICROSCOPIC  D-DIMER, QUANTITATIVE (NOT AT [facility])  TROPONIN I  CBG MONITORING, ED    EKG EKG Interpretation  Date/Time:  Wednesday June 10 2018 09:53:16 EDT Ventricular Rate:  113 PR Interval:  144 QRS Duration: 84 QT Interval:  324 QTC Calculation: 444 R Axis:   53 Text Interpretation:  Sinus tachycardia Nonspecific ST abnormality Abnormal ECG Confirmed by Julianne Rice [phone]) on 06/10/2018 3:47:06 PM   Radiology Ct Angio Head W Or Wo Contrast  Result Date: 06/10/2018 CLINICAL DATA:  Syncope. EXAM: CT ANGIOGRAPHY HEAD AND NECK TECHNIQUE: Multidetector CT imaging of the head and neck was performed using the standard protocol during bolus administration of intravenous contrast. Multiplanar CT image reconstructions and MIPs were obtained to evaluate the vascular anatomy. Carotid stenosis measurements (when applicable) are obtained utilizing NASCET criteria, using the distal internal carotid diameter as the denominator. CONTRAST:  39mL ISOVUE-370 IOPAMIDOL (ISOVUE-370) INJECTION 76% COMPARISON:  Head CT 06/10/2018.  No prior angiographic imaging. FINDINGS: CT HEAD FINDINGS Brain: There is no evidence of acute infarct, intracranial hemorrhage,  mass, midline shift, or extra-axial fluid collection. The ventricles and sulci are normal. Vascular: Evaluated below. Skull: No fracture or focal osseous lesion. Sinuses: Mild left ethmoid and maxillary sinus mucosal thickening. Clear mastoid air cells. Orbits: Unremarkable. Review of the MIP images confirms the above findings CTA NECK FINDINGS Aortic arch: Normal variant aortic arch branching pattern with common origin of the brachiocephalic and left common carotid arteries. Widely patent arch vessel origins. Right carotid system: Patent without evidence of stenosis, dissection, or significant atherosclerosis. Left carotid system: Patent without evidence of stenosis, dissection, or significant atherosclerosis. Vertebral arteries: Patent without  evidence of stenosis, dissection, or significant atherosclerosis. Moderately dominant left vertebral artery. Skeleton: Mild diffuse cervical spondylosis. No suspicious osseous lesion. Other neck: Asymmetric right styloid process enlargement and diffuse right stylohyoid ligament calcification with milder changes on the left, typically an incidental finding though can be seen with Eagle syndrome. No neck mass. Upper chest: Clear lung apices. Review of the MIP images confirms the above findings CTA HEAD FINDINGS Anterior circulation: The internal carotid arteries are patent from skull base to carotid termini with mild nonstenotic atherosclerosis bilaterally. ACAs and MCAs are patent with mild branch vessel irregularity but no evidence of proximal branch occlusion or significant proximal stenosis. The left A1 segment is absent. No aneurysm is identified. Posterior circulation: The intracranial vertebral arteries are patent to the basilar. Patent left PICA, bilateral AICA, and bilateral SCA origins are identified. The right PICA shares a common trunk with the right AICA. The basilar artery is widely patent. There are prominent bilateral posterior communicating arteries, left larger than right with absence of the left P1 segment. The PCAs are patent with branch vessel irregularity and attenuation bilaterally but no evidence of significant proximal stenosis. No aneurysm is identified. Venous sinuses: Patent. Anatomic variants: Fetal left PCA.  Absent left A1. Delayed phase: No abnormal enhancement. Review of the MIP images confirms the above findings IMPRESSION: 1. Mild intracranial atherosclerosis without large vessel occlusion, significant proximal stenosis, or aneurysm. 2. Widely patent cervical carotid and vertebral arteries. Electronically Signed   By: Logan Bores M.D.   On: 06/10/2018 14:10   Ct Head Wo Contrast  Result Date: 06/10/2018 CLINICAL DATA:  67 year old male with multiple near syncopal episodes over  the past 1.5 weeks. No known injury. EXAM: CT HEAD WITHOUT CONTRAST TECHNIQUE: Contiguous axial images were obtained from the base of the skull through the vertex without intravenous contrast. COMPARISON:  Body bone scan 01/27/2018. FINDINGS: Brain: Small focus of probable inconsequential dural thickening along the right superior frontal convexity on series 4, image 60. Cerebral volume is within normal limits for age. No midline shift, ventriculomegaly, mass effect, evidence of mass lesion, intracranial hemorrhage or evidence of cortically based acute infarction. Gray-white matter differentiation is within normal limits throughout the brain. No cortical encephalomalacia identified. Vascular: Mild Calcified atherosclerosis at the skull base. No suspicious intracranial vascular hyperdensity. Skull: Hypertrophied bilateral styloid process, stylohyoid ligament greater on the right (series 4, image 1). Otherwise negative. Sinuses/Orbits: Mild ethmoid and maxillary sinus mucosal thickening, otherwise well pneumatized. Tympanic cavities and mastoids are clear. Other: Visualized orbit soft tissues are within normal limits. No acute scalp soft tissue findings. IMPRESSION: 1.  Normal for age non contrast CT appearance of the brain. 2. Mild paranasal sinus mucosal thickening. Electronically Signed   By: Genevie Ann M.D.   On: 06/10/2018 12:52   Ct Angio Neck W And/or Wo Contrast  Result Date: 06/10/2018 CLINICAL DATA:  Syncope. EXAM: CT ANGIOGRAPHY HEAD  AND NECK TECHNIQUE: Multidetector CT imaging of the head and neck was performed using the standard protocol during bolus administration of intravenous contrast. Multiplanar CT image reconstructions and MIPs were obtained to evaluate the vascular anatomy. Carotid stenosis measurements (when applicable) are obtained utilizing NASCET criteria, using the distal internal carotid diameter as the denominator. CONTRAST:  57mL ISOVUE-370 IOPAMIDOL (ISOVUE-370) INJECTION 76% COMPARISON:   Head CT 06/10/2018.  No prior angiographic imaging. FINDINGS: CT HEAD FINDINGS Brain: There is no evidence of acute infarct, intracranial hemorrhage, mass, midline shift, or extra-axial fluid collection. The ventricles and sulci are normal. Vascular: Evaluated below. Skull: No fracture or focal osseous lesion. Sinuses: Mild left ethmoid and maxillary sinus mucosal thickening. Clear mastoid air cells. Orbits: Unremarkable. Review of the MIP images confirms the above findings CTA NECK FINDINGS Aortic arch: Normal variant aortic arch branching pattern with common origin of the brachiocephalic and left common carotid arteries. Widely patent arch vessel origins. Right carotid system: Patent without evidence of stenosis, dissection, or significant atherosclerosis. Left carotid system: Patent without evidence of stenosis, dissection, or significant atherosclerosis. Vertebral arteries: Patent without evidence of stenosis, dissection, or significant atherosclerosis. Moderately dominant left vertebral artery. Skeleton: Mild diffuse cervical spondylosis. No suspicious osseous lesion. Other neck: Asymmetric right styloid process enlargement and diffuse right stylohyoid ligament calcification with milder changes on the left, typically an incidental finding though can be seen with Eagle syndrome. No neck mass. Upper chest: Clear lung apices. Review of the MIP images confirms the above findings CTA HEAD FINDINGS Anterior circulation: The internal carotid arteries are patent from skull base to carotid termini with mild nonstenotic atherosclerosis bilaterally. ACAs and MCAs are patent with mild branch vessel irregularity but no evidence of proximal branch occlusion or significant proximal stenosis. The left A1 segment is absent. No aneurysm is identified. Posterior circulation: The intracranial vertebral arteries are patent to the basilar. Patent left PICA, bilateral AICA, and bilateral SCA origins are identified. The right PICA  shares a common trunk with the right AICA. The basilar artery is widely patent. There are prominent bilateral posterior communicating arteries, left larger than right with absence of the left P1 segment. The PCAs are patent with branch vessel irregularity and attenuation bilaterally but no evidence of significant proximal stenosis. No aneurysm is identified. Venous sinuses: Patent. Anatomic variants: Fetal left PCA.  Absent left A1. Delayed phase: No abnormal enhancement. Review of the MIP images confirms the above findings IMPRESSION: 1. Mild intracranial atherosclerosis without large vessel occlusion, significant proximal stenosis, or aneurysm. 2. Widely patent cervical carotid and vertebral arteries. Electronically Signed   By: Logan Bores M.D.   On: 06/10/2018 14:10    Procedures Procedures (including critical care time)  Medications Ordered in ED Medications  iopamidol (ISOVUE-370) 76 % injection (has no administration in time range)  iopamidol (ISOVUE-370) 76 % injection 100 mL (50 mLs Intravenous Contrast Given 06/10/18 1331)     Initial Impression / Assessment and Plan / ED Course  I have reviewed the triage vital signs and the nursing notes.  Pertinent labs & imaging results that were available during my care of the patient were reviewed by me and considered in my medical decision making (see chart for details).    Ct a head and neck without acute findings. Mild tachycardia. VS otherwise stable. Discussed with neuorology recommends EEG and consult if abnormal. Spoke with Dr Lorin Mercy. Will admit for observation.    Final Clinical Impressions(s) / ED Diagnoses   Final diagnoses:  Syncope, unspecified syncope  type    ED Discharge Orders    None       Julianne Rice, MD 06/10/18 1551

## 2018-06-10 NOTE — ED Notes (Signed)
Pt states these episodes come at no particular time, his head just all swimmy and he gets diuzzy

## 2018-06-10 NOTE — ED Notes (Signed)
Pt up to void several times warned to keep backs of legs up agaginst bed incase he feels  Dizzy and to sit,  Pt to ct for angio

## 2018-06-10 NOTE — ED Triage Notes (Addendum)
For the past week he can be sitting and all of a sudden his head would spin for no reason, felt syncopy and had a BAD FEELING, states can be sitting still and it will hit that feeling of passing out , states occ he can lay down and it will happen , states has hx of surgery on prostate 8  Weeks ago

## 2018-06-10 NOTE — H&P (Signed)
History and Physical    Nathan [name] RSW:[id number] DOB: [birth year] DOA: 06/10/2018  PCP: Kristopher Glee., MD Consultants:  Urology - Cleophus Molt Patient coming from:  Home - lives with wife; NOK: Daughter Hebert Soho, [phone]; Daughter Lou Cal, NP); [phone]  Chief Complaint: Dizziness  HPI: Nathan [name] is a 67 y.o. male with medical history significant of prostate adenocarcinoma; HLD; and HTN presenting with dizziness.  For about the last week or so, he will be having a conversation and he has a rush through his head.  He thought it was mini-stroke or seizure.  For about 5 seconds, "things just go blank and then I just come back to normal."  It happens once or twice a day.  It happened last night while he was watching tv and he felt flushed and could tell it as coming.  He laid down and it happened again.  He came here to work this AM and went to morning report and at the end of the meeting he was walking out of the room and it happened again.  He felt like he might pass out.  It happened again when Dr. Lita Mains was in the room - when he returned to consciousness, the patient thought he was a different doctor.  The episodes have been more frequent this past 24 hours.  Previously his symptoms resolved completely.  Today, he continues to feel a little dizzy.  It does not seem to be related to sudden moves.  It happens most frequently while sitting but not always.  No loss of bowel or bladder function - other than having had prostatectomy about 8 weeks ago.  He has not had anything to ear all day and is hungry.  His wife is hospitalized on the 5th floor, has caregivers at home, and she is being discharged to a SNF today.  He denies recent stress, although those around him feel like he has been stressed.   ED Course:  1 week of episodic dizziness, loses vision, and passes out and then it resolves. Maybe 12 times in a week.  Not positional.  He had an episode at work today.  Here with  mild tachycardia.  Normal neuro exam.  CTA head and neck negative.  ?absence seizures.  Recommend observation on tele.  Review of Systems: As per HPI; otherwise review of systems reviewed and negative.   Ambulatory Status:  Ambulates without assistance  Past Medical History:  Diagnosis Date  . Diabetes mellitus without complication ([place])    type 2  . H/O cardiac catheterization   . Hyperlipidemia   . Malignant neoplasm of prostate ([place]) 02/05/2018    Past Surgical History:  Procedure Laterality Date  . colonscopy    . LYMPHADENECTOMY Bilateral 03/30/2018   Procedure: LYMPHADENECTOMY, PELVIC;  Surgeon: Raynelle Bring, MD;  Location: [facility];  Service: Urology;  Laterality: Bilateral;  . PROSTATE BIOPSY  01/2018  . ROBOT ASSISTED LAPAROSCOPIC RADICAL PROSTATECTOMY N/A 03/30/2018   Procedure: XI ROBOTIC ASSISTED LAPAROSCOPIC RADICAL PROSTATECTOMY LEVEL 3;  Surgeon: Raynelle Bring, MD;  Location: [facility];  Service: Urology;  Laterality: N/A;  ONLY NEEDS 210 MIN TOTAL FOR PROCEDURE  . vasedtomy  1986    Social History   Socioeconomic History  . Marital status: Married    Spouse name: Not on file  . Number of children: Not on file  . Years of education: Not on file  . Highest education level: Not on file  Occupational History  . Occupation: chaplin  Social Needs  . Financial resource strain: Not on file  . Food insecurity:    Worry: Not on file    Inability: Not on file  . Transportation needs:    Medical: Not on file    Non-medical: Not on file  Tobacco Use  . Smoking status: Never Smoker  . Smokeless tobacco: Never Used  Substance and Sexual Activity  . Alcohol use: Never    Alcohol/week: 0.0 standard drinks    Frequency: Never  . Drug use: Never  . Sexual activity: Yes  Lifestyle  . Physical activity:    Days per week: Not on file    Minutes per session: Not on file  . Stress: Not on file  Relationships  . Social connections:    Talks on phone: Not on file    Gets  together: Not on file    Attends religious service: Not on file    Active member of club or organization: Not on file    Attends meetings of clubs or organizations: Not on file    Relationship status: Not on file  . Intimate partner violence:    Fear of current or ex partner: Not on file    Emotionally abused: Not on file    Physically abused: Not on file    Forced sexual activity: Not on file  Other Topics Concern  . Not on file  Social History Narrative  . Not on file    No Known Allergies  Family History  Problem Relation Age of Onset  . Cancer Mother     Prior to Admission medications   Medication Sig Start Date End Date Taking? Authorizing Provider  rosuvastatin (CRESTOR) 20 MG tablet Take 20 mg by mouth at bedtime.    Yes [provider]  sitaGLIPtin-metformin (JANUMET) 50-1000 MG tablet Take 1 tablet by mouth 2 (two) times daily with a meal.   Yes [provider]  traMADol (ULTRAM) 50 MG tablet Take 1-2 tablets (50-100 mg total) by mouth every 6 (six) hours as needed for moderate pain or severe pain. Patient not taking: Reported on 06/10/2018 03/30/18   Debbrah Alar, PA-C    Physical Exam: Vitals:   06/10/18 1515 06/10/18 1530 06/10/18 1545 06/10/18 1615  BP: (!) 153/86 (!) 140/94 (!) 126/94 (!) 109/51  Pulse: (!) 106 (!) 101 (!) 106 99  Resp: 18 16 (!) 23 16  Temp:      TempSrc:      SpO2: 100% 99% 97% 99%     General:  Appears calm and comfortable and is NAD; he is very conversant Eyes:  PERRL, EOMI, normal lids, iris ENT:  grossly normal hearing, lips & tongue, mmm; appropriate dentition Neck:  no LAD, masses or thyromegaly; no carotid bruits Cardiovascular:  RR with mild tachycardia, no m/r/g. No LE edema.  Respiratory:   CTA bilaterally with no wheezes/rales/rhonchi.  Normal respiratory effort. Abdomen:  soft, NT, ND, NABS Back:   normal alignment, no CVAT Skin:  no rash or induration seen on limited exam Musculoskeletal:  grossly  normal tone BUE/BLE, good ROM, no bony abnormality Lower extremity:  No LE edema.  Limited foot exam with no ulcerations.  2+ distal pulses. Psychiatric:  grossly normal mood and affect, speech fluent and appropriate, AOx3 Neurologic:  CN 2-12 grossly intact, moves all extremities in coordinated fashion, sensation intact    Radiological Exams on Admission: Ct Angio Head W Or Wo Contrast  Result Date: 06/10/2018 CLINICAL DATA:  Syncope. EXAM: CT ANGIOGRAPHY  HEAD AND NECK TECHNIQUE: Multidetector CT imaging of the head and neck was performed using the standard protocol during bolus administration of intravenous contrast. Multiplanar CT image reconstructions and MIPs were obtained to evaluate the vascular anatomy. Carotid stenosis measurements (when applicable) are obtained utilizing NASCET criteria, using the distal internal carotid diameter as the denominator. CONTRAST:  45mL ISOVUE-370 IOPAMIDOL (ISOVUE-370) INJECTION 76% COMPARISON:  Head CT 06/10/2018.  No prior angiographic imaging. FINDINGS: CT HEAD FINDINGS Brain: There is no evidence of acute infarct, intracranial hemorrhage, mass, midline shift, or extra-axial fluid collection. The ventricles and sulci are normal. Vascular: Evaluated below. Skull: No fracture or focal osseous lesion. Sinuses: Mild left ethmoid and maxillary sinus mucosal thickening. Clear mastoid air cells. Orbits: Unremarkable. Review of the MIP images confirms the above findings CTA NECK FINDINGS Aortic arch: Normal variant aortic arch branching pattern with common origin of the brachiocephalic and left common carotid arteries. Widely patent arch vessel origins. Right carotid system: Patent without evidence of stenosis, dissection, or significant atherosclerosis. Left carotid system: Patent without evidence of stenosis, dissection, or significant atherosclerosis. Vertebral arteries: Patent without evidence of stenosis, dissection, or significant atherosclerosis. Moderately  dominant left vertebral artery. Skeleton: Mild diffuse cervical spondylosis. No suspicious osseous lesion. Other neck: Asymmetric right styloid process enlargement and diffuse right stylohyoid ligament calcification with milder changes on the left, typically an incidental finding though can be seen with Eagle syndrome. No neck mass. Upper chest: Clear lung apices. Review of the MIP images confirms the above findings CTA HEAD FINDINGS Anterior circulation: The internal carotid arteries are patent from skull base to carotid termini with mild nonstenotic atherosclerosis bilaterally. ACAs and MCAs are patent with mild branch vessel irregularity but no evidence of proximal branch occlusion or significant proximal stenosis. The left A1 segment is absent. No aneurysm is identified. Posterior circulation: The intracranial vertebral arteries are patent to the basilar. Patent left PICA, bilateral AICA, and bilateral SCA origins are identified. The right PICA shares a common trunk with the right AICA. The basilar artery is widely patent. There are prominent bilateral posterior communicating arteries, left larger than right with absence of the left P1 segment. The PCAs are patent with branch vessel irregularity and attenuation bilaterally but no evidence of significant proximal stenosis. No aneurysm is identified. Venous sinuses: Patent. Anatomic variants: Fetal left PCA.  Absent left A1. Delayed phase: No abnormal enhancement. Review of the MIP images confirms the above findings IMPRESSION: 1. Mild intracranial atherosclerosis without large vessel occlusion, significant proximal stenosis, or aneurysm. 2. Widely patent cervical carotid and vertebral arteries. Electronically Signed   By: Logan Bores M.D.   On: 06/10/2018 14:10   Ct Head Wo Contrast  Result Date: 06/10/2018 CLINICAL DATA:  67 year old male with multiple near syncopal episodes over the past 1.5 weeks. No known injury. EXAM: CT HEAD WITHOUT CONTRAST  TECHNIQUE: Contiguous axial images were obtained from the base of the skull through the vertex without intravenous contrast. COMPARISON:  Body bone scan 01/27/2018. FINDINGS: Brain: Small focus of probable inconsequential dural thickening along the right superior frontal convexity on series 4, image 60. Cerebral volume is within normal limits for age. No midline shift, ventriculomegaly, mass effect, evidence of mass lesion, intracranial hemorrhage or evidence of cortically based acute infarction. Gray-white matter differentiation is within normal limits throughout the brain. No cortical encephalomalacia identified. Vascular: Mild Calcified atherosclerosis at the skull base. No suspicious intracranial vascular hyperdensity. Skull: Hypertrophied bilateral styloid process, stylohyoid ligament greater on the right (series 4, image 1).  Otherwise negative. Sinuses/Orbits: Mild ethmoid and maxillary sinus mucosal thickening, otherwise well pneumatized. Tympanic cavities and mastoids are clear. Other: Visualized orbit soft tissues are within normal limits. No acute scalp soft tissue findings. IMPRESSION: 1.  Normal for age non contrast CT appearance of the brain. 2. Mild paranasal sinus mucosal thickening. Electronically Signed   By: Genevie Ann M.D.   On: 06/10/2018 12:52   Ct Angio Neck W And/or Wo Contrast  Result Date: 06/10/2018 CLINICAL DATA:  Syncope. EXAM: CT ANGIOGRAPHY HEAD AND NECK TECHNIQUE: Multidetector CT imaging of the head and neck was performed using the standard protocol during bolus administration of intravenous contrast. Multiplanar CT image reconstructions and MIPs were obtained to evaluate the vascular anatomy. Carotid stenosis measurements (when applicable) are obtained utilizing NASCET criteria, using the distal internal carotid diameter as the denominator. CONTRAST:  77mL ISOVUE-370 IOPAMIDOL (ISOVUE-370) INJECTION 76% COMPARISON:  Head CT 06/10/2018.  No prior angiographic imaging. FINDINGS: CT  HEAD FINDINGS Brain: There is no evidence of acute infarct, intracranial hemorrhage, mass, midline shift, or extra-axial fluid collection. The ventricles and sulci are normal. Vascular: Evaluated below. Skull: No fracture or focal osseous lesion. Sinuses: Mild left ethmoid and maxillary sinus mucosal thickening. Clear mastoid air cells. Orbits: Unremarkable. Review of the MIP images confirms the above findings CTA NECK FINDINGS Aortic arch: Normal variant aortic arch branching pattern with common origin of the brachiocephalic and left common carotid arteries. Widely patent arch vessel origins. Right carotid system: Patent without evidence of stenosis, dissection, or significant atherosclerosis. Left carotid system: Patent without evidence of stenosis, dissection, or significant atherosclerosis. Vertebral arteries: Patent without evidence of stenosis, dissection, or significant atherosclerosis. Moderately dominant left vertebral artery. Skeleton: Mild diffuse cervical spondylosis. No suspicious osseous lesion. Other neck: Asymmetric right styloid process enlargement and diffuse right stylohyoid ligament calcification with milder changes on the left, typically an incidental finding though can be seen with Eagle syndrome. No neck mass. Upper chest: Clear lung apices. Review of the MIP images confirms the above findings CTA HEAD FINDINGS Anterior circulation: The internal carotid arteries are patent from skull base to carotid termini with mild nonstenotic atherosclerosis bilaterally. ACAs and MCAs are patent with mild branch vessel irregularity but no evidence of proximal branch occlusion or significant proximal stenosis. The left A1 segment is absent. No aneurysm is identified. Posterior circulation: The intracranial vertebral arteries are patent to the basilar. Patent left PICA, bilateral AICA, and bilateral SCA origins are identified. The right PICA shares a common trunk with the right AICA. The basilar artery is  widely patent. There are prominent bilateral posterior communicating arteries, left larger than right with absence of the left P1 segment. The PCAs are patent with branch vessel irregularity and attenuation bilaterally but no evidence of significant proximal stenosis. No aneurysm is identified. Venous sinuses: Patent. Anatomic variants: Fetal left PCA.  Absent left A1. Delayed phase: No abnormal enhancement. Review of the MIP images confirms the above findings IMPRESSION: 1. Mild intracranial atherosclerosis without large vessel occlusion, significant proximal stenosis, or aneurysm. 2. Widely patent cervical carotid and vertebral arteries. Electronically Signed   By: Logan Bores M.D.   On: 06/10/2018 14:10    EKG: Independently reviewed.   0953 - Sinus tachycardia with rate 113; nonspecific ST changes with no evidence of acute ischemia 1416 - NSR with rate 95; nonspecific ST changes with no evidence of acute ischemia   Labs on Admission: I have personally reviewed the available labs and imaging studies at the time of the  admission.  Pertinent labs:   Glucose 159; A1c 6.8 on 10/7 BMP otherwise WNL CBC WNL Troponin <0.03 D-dimer 0.27 UA WNL Lipids on 10/7: 212, 41, 182, 144 TSH 0.730 on 10/04/17  Assessment/Plan Principal Problem:   Syncope and collapse Active Problems:   Hyperlipidemia   Controlled type 2 diabetes mellitus without complication (HCC)   Malignant neoplasm of prostate ([place])   Syncope -Patient is having very transient (5 seconds) episodes of apparent LOC with vision loss -He does not actually fall to the floor during the episodes -The episodes appear to be escalating in frequency -CT/CTA were quite unremarkable, as were his labs -Absence seizures are a definite consideration; neurology (discussed patient by telephone with ER doctor) recommends EEG and consult if abnormal -I do not see a current indication for MRI based on really normal appearance of CT and atypical  symptoms at this time -Will monitor overnight on telemetry; it is reasonable to place the patient on 5C since he is expected to be appropriate for d/c tomorrow unless new issues arise -Orthostatic vital signs now and in AM -Troponin negative x 1, very low suspicion for ACS -2d echo ordered -Neuro checks  -start ASA  HLD -Patient with known HLD -He previously took Crestor but developed myalgias and so stopped taking the medication -Since stopped the medication, his LDL has risen considerably -Dr. Karle Starch has encouraged him to resume this medication, but he is currently unsure about his willingness to do this and so has not started it yet -Will continue to hold due to lack of current evidence for ASCVD but this needs to be addressed on an ongoing basis  DM -Recent A1c shows good control -hold Janumet -Cover with moderate-scale SSI  Prostate CA -s/p robotic assisted laparoscopic radical prostatectomy on 8/5 -He appears to have done well post-oepratively   DVT prophylaxis:  Lovenox  Code Status:  Full - confirmed with patient Family Communication: None present  Disposition Plan:  Home once clinically improved Consults called: Neurology (telephone only) Admission status: It is my clinical opinion that referral for OBSERVATION is reasonable and necessary in this patient based on the above information provided. The aforementioned taken together are felt to place the patient at high risk for further clinical deterioration. However it is anticipated that the patient may be medically stable for discharge from the hospital within 24 to 48 hours.    Karmen Bongo MD [facility]  If note is complete, please contact covering daytime or nighttime physician. [email or web address] Password [email or web address]  06/10/2018, 4:42 PM

## 2018-06-10 NOTE — ED Notes (Signed)
Pt states has had several episodes of this dizziness it comes and goes quick since being in ER

## 2018-06-11 ENCOUNTER — Observation Stay (HOSPITAL_COMMUNITY): Payer: PPO

## 2018-06-11 ENCOUNTER — Observation Stay (HOSPITAL_BASED_OUTPATIENT_CLINIC_OR_DEPARTMENT_OTHER): Payer: PPO

## 2018-06-11 DIAGNOSIS — C61 Malignant neoplasm of prostate: Secondary | ICD-10-CM | POA: Diagnosis present

## 2018-06-11 DIAGNOSIS — Z86010 Personal history of colonic polyps: Secondary | ICD-10-CM | POA: Diagnosis not present

## 2018-06-11 DIAGNOSIS — Z9852 Vasectomy status: Secondary | ICD-10-CM | POA: Diagnosis not present

## 2018-06-11 DIAGNOSIS — E860 Dehydration: Secondary | ICD-10-CM | POA: Diagnosis present

## 2018-06-11 DIAGNOSIS — E119 Type 2 diabetes mellitus without complications: Secondary | ICD-10-CM | POA: Diagnosis present

## 2018-06-11 DIAGNOSIS — H547 Unspecified visual loss: Secondary | ICD-10-CM | POA: Diagnosis present

## 2018-06-11 DIAGNOSIS — Z9079 Acquired absence of other genital organ(s): Secondary | ICD-10-CM | POA: Diagnosis not present

## 2018-06-11 DIAGNOSIS — R42 Dizziness and giddiness: Secondary | ICD-10-CM | POA: Diagnosis not present

## 2018-06-11 DIAGNOSIS — R55 Syncope and collapse: Secondary | ICD-10-CM | POA: Diagnosis present

## 2018-06-11 DIAGNOSIS — I5030 Unspecified diastolic (congestive) heart failure: Secondary | ICD-10-CM

## 2018-06-11 DIAGNOSIS — E785 Hyperlipidemia, unspecified: Secondary | ICD-10-CM | POA: Diagnosis present

## 2018-06-11 DIAGNOSIS — R51 Headache: Secondary | ICD-10-CM | POA: Diagnosis not present

## 2018-06-11 DIAGNOSIS — Z7984 Long term (current) use of oral hypoglycemic drugs: Secondary | ICD-10-CM | POA: Diagnosis not present

## 2018-06-11 DIAGNOSIS — I10 Essential (primary) hypertension: Secondary | ICD-10-CM | POA: Diagnosis present

## 2018-06-11 DIAGNOSIS — R000 Tachycardia, unspecified: Secondary | ICD-10-CM | POA: Diagnosis present

## 2018-06-11 DIAGNOSIS — Z79899 Other long term (current) drug therapy: Secondary | ICD-10-CM | POA: Diagnosis not present

## 2018-06-11 DIAGNOSIS — H5500 Unspecified nystagmus: Secondary | ICD-10-CM | POA: Diagnosis present

## 2018-06-11 LAB — GLUCOSE, CAPILLARY
GLUCOSE-CAPILLARY: 121 mg/dL — AB (ref 70–99)
Glucose-Capillary: 131 mg/dL — ABNORMAL HIGH (ref 70–99)
Glucose-Capillary: 177 mg/dL — ABNORMAL HIGH (ref 70–99)
Glucose-Capillary: 151 mg/dL — ABNORMAL HIGH (ref 70–99)

## 2018-06-11 LAB — HIV ANTIBODY (ROUTINE TESTING W REFLEX): HIV SCREEN 4TH GENERATION: NONREACTIVE

## 2018-06-11 LAB — BASIC METABOLIC PANEL
Anion gap: 6 (ref 5–15)
BUN: 17 mg/dL (ref 8–23)
CHLORIDE: 106 mmol/L (ref 98–111)
CO2: 26 mmol/L (ref 22–32)
CREATININE: 1.14 mg/dL (ref 0.61–1.24)
Calcium: 9.2 mg/dL (ref 8.9–10.3)
GFR calc Af Amer: >60 mL/min (ref >60)
GFR calc non Af Amer: >60 mL/min (ref >60)
GLUCOSE: 121 mg/dL — AB (ref 70–99)
Potassium: 3.8 mmol/L (ref 3.5–5.1)
SODIUM: 138 mmol/L (ref 135–145)

## 2018-06-11 LAB — CBC
HEMATOCRIT: 42.7 % (ref 39.0–52.0)
Hemoglobin: 13.1 g/dL (ref 13.0–17.0)
MCH: 26.1 pg (ref 26.0–34.0)
MCHC: 30.7 g/dL (ref 30.0–36.0)
MCV: 85.2 fL (ref 80.0–100.0)
Platelets: 253 10*3/uL (ref 150–400)
RBC: 5.01 MIL/uL (ref 4.22–5.81)
RDW: 12.7 % (ref 11.5–15.5)
WBC: 7 10*3/uL (ref 4.0–10.5)
nRBC: 0 % (ref 0.0–0.2)

## 2018-06-11 LAB — ECHOCARDIOGRAM COMPLETE: Weight: 3414.48 oz

## 2018-06-11 NOTE — Progress Notes (Signed)
  Echocardiogram 2D Echocardiogram has been performed.  Matilde Bash 06/11/2018, 8:38 AM

## 2018-06-11 NOTE — Progress Notes (Signed)
Pt at this time refuses to take orthostatics, pt states he will be willing to try later in the day.

## 2018-06-11 NOTE — Progress Notes (Signed)
Pt arrived to [place] room 5. A&O x4. Call bell within reach. All questions/concerns addressed. Will continue to monitor.

## 2018-06-11 NOTE — Progress Notes (Signed)
Pt ambulated to 5W to visit with wife whom is a pt in RM 4.   This RN walked with pt to 5W.   Pt now back on 5C Rm 07.

## 2018-06-11 NOTE — Progress Notes (Signed)
EEG completed; results pending.    

## 2018-06-11 NOTE — Progress Notes (Signed)
Pt transported to ECHO. 

## 2018-06-11 NOTE — Progress Notes (Signed)
Pt heart rate over night was in the 100-120 sinus tach on the monitor. Pt doesn't have any neuro deficits, or sensation of the the heart rate.

## 2018-06-11 NOTE — Progress Notes (Signed)
PROGRESS NOTE    Nathan [name]  ZOX:[id number] DOB: [birth year] DOA: 06/10/2018 PCP: Kristopher Glee., MD   Brief Narrative:  67 year old with history of prostate cancer/adenocarcinoma status post resection, essential hypertension, hyperlipidemia came to the hospital with complaints of dizziness.  Patient states for the past 7-10 days he has been feeling head rush lasting for 5-10 seconds causing him to go blank without any loss of consciousness and immediately returns back to being normal.  At first this was happening once a day but over the last few days is been happening 2-3 times.  His neuro exam was unremarkable.  Vital signs show tachycardia.  CTA of the head and neck was negative.  Neurology recommended getting EEG.   Assessment & Plan:   Principal Problem:   Syncope and collapse Active Problems:   Hyperlipidemia   Controlled type 2 diabetes mellitus without complication (HCC)   Malignant neoplasm of prostate (HCC)  Presyncope/dizziness- "Intermittent head rush"; still persist but less in frequency.  - Difficult to pinpoint exact etiology of his symptoms at this time.  Given the nature and tachycardia could be cardiac in nature but need to rule out any seizure episodes.  What is concerning as he momentarily blanks out. - No acute events noted on telemetry, cardiac enzymes are negative -Echocardiogram done-pending -CTA of the head and neck is negative for any acute pathology -EEG-pending - We will order MRI of the brain to rule out any intracranial pathology that could be precipitating any of the symptoms. -TSH is within normal limits 0.65, d-dimer is negative -We will get final neurology input once the EEG and MRI has resulted.  Sinus tachycardia; still persist.  Mild to moderate dehydration - Patient still remains tachycardic and slightly dizzy.  Will give 1 L normal saline bolus, increase of fluid rate from 75 cc/h to 100 cc/h.  Monitor urine  output.  Hyperlipidemia -Previously has not tolerated Crestor.  Diabetes mellitus type 2 with -Sliding scale and Accu-Chek  History of adenocarcinoma prostate cancer -Status post robotic assisted laparoscopic radical prostatectomy 12/2017  DVT prophylaxis: Lovenox Code Status: Full code Family Communication: None at bedside Disposition Plan: Patient should remains dizzy, he will require IV fluids and close monitoring.  His tachycardia still persist which is not normal for him.  We should continue at least another 24-48 hours of inpatient stay for more IV fluids and further evaluation of his dizziness  Consultants:   Curbside neurology  Procedures:   None  Antimicrobials:   None   Subjective: Patient reports that he feels slightly dizzy this morning when his episodes are much less severe than what he had yesterday evening.  He also had one in the ER where he felt had rash followed by a very brief moment of blanking out which he describes as "had to close his eyes and pause".   Review of Systems Otherwise negative except as per HPI, including: General: Denies fever, chills, night sweats or unintended weight loss. Resp: Denies cough, wheezing, shortness of breath. Cardiac: Denies chest pain, palpitations, orthopnea, paroxysmal nocturnal dyspnea. GI: Denies abdominal pain, nausea, vomiting, diarrhea or constipation GU: Denies dysuria, frequency, hesitancy or incontinence MS: Denies muscle aches, joint pain or swelling Neuro: Denies headache, neurologic deficits (focal weakness, numbness, tingling), abnormal gait Psych: Denies anxiety, depression, SI/HI/AVH Skin: Denies new rashes or lesions ID: Denies sick contacts, exotic exposures, travel  Objective: Vitals:   06/10/18 2348 06/11/18 0500 06/11/18 0558 06/11/18 1003  BP: 138/72  113/75 121/86  Pulse: Marland Kitchen)  104  96 (!) 104  Resp: 16  16 18   Temp: 98.2 F (36.8 C)  97.6 F (36.4 C) 98.4 F (36.9 C)  TempSrc: Oral  Oral  Oral  SpO2: 98%  100% 100%  Weight:  98.6 kg 96.8 kg     Intake/Output Summary (Last 24 hours) at 06/11/2018 1353 Last data filed at 06/11/2018 1247 Gross per 24 hour  Intake 1000 ml  Output 225 ml  Net 775 ml   Filed Weights   06/11/18 0500 06/11/18 0558  Weight: 98.6 kg 96.8 kg    Examination:  General exam: Appears calm and comfortable  Respiratory system: Clear to auscultation. Respiratory effort normal.  Dry mucous membrane Cardiovascular system: Sinus tachycardia, S1 & S2 heard, RRR. No JVD, murmurs, rubs, gallops or clicks. No pedal edema. Gastrointestinal system: Abdomen is nondistended, soft and nontender. No organomegaly or masses felt. Normal bowel sounds heard. Central nervous system: Alert and oriented. No focal neurological deficits. Extremities: Symmetric 5 x 5 power. Skin: No rashes, lesions or ulcers Psychiatry: Judgement and insight appear normal. Mood & affect appropriate.     Data Reviewed:   CBC: Recent Labs  Lab 06/10/18 1043 06/11/18 0447  WBC 4.7 7.0  HGB 14.4 13.1  HCT 47.0 42.7  MCV 85.8 85.2  PLT 278 166   Basic Metabolic Panel: Recent Labs  Lab 06/10/18 1043 06/11/18 0447  NA 138 138  K 4.2 3.8  CL 102 106  CO2 27 26  GLUCOSE 159* 121*  BUN 11 17  CREATININE 1.07 1.14  CALCIUM 9.7 9.2   GFR: Estimated Creatinine Clearance: 74.4 mL/min (by C-G formula based on SCr of 1.14 mg/dL). Liver Function Tests: No results for input(s): AST, ALT, ALKPHOS, BILITOT, PROT, ALBUMIN in the last 168 hours. No results for input(s): LIPASE, AMYLASE in the last 168 hours. No results for input(s): AMMONIA in the last 168 hours. Coagulation Profile: No results for input(s): INR, PROTIME in the last 168 hours. Cardiac Enzymes: Recent Labs  Lab 06/10/18 1119  TROPONINI <0.03   BNP (last 3 results) No results for input(s): PROBNP in the last 8760 hours. HbA1C: No results for input(s): HGBA1C in the last 72 hours. CBG: Recent Labs  Lab  06/10/18 1019 06/10/18 1728 06/10/18 2312 06/11/18 0748 06/11/18 1200  GLUCAP 140* 132* 270* 131* 177*   Lipid Profile: No results for input(s): CHOL, HDL, LDLCALC, TRIG, CHOLHDL, LDLDIRECT in the last 72 hours. Thyroid Function Tests: Recent Labs    06/10/18 1840  TSH 0.656   Anemia Panel: No results for input(s): VITAMINB12, FOLATE, FERRITIN, TIBC, IRON, RETICCTPCT in the last 72 hours. Sepsis Labs: No results for input(s): PROCALCITON, LATICACIDVEN in the last 168 hours.  No results found for this or any previous visit (from the past 240 hour(s)).       Radiology Studies: Ct Angio Head W Or Wo Contrast  Result Date: 06/10/2018 CLINICAL DATA:  Syncope. EXAM: CT ANGIOGRAPHY HEAD AND NECK TECHNIQUE: Multidetector CT imaging of the head and neck was performed using the standard protocol during bolus administration of intravenous contrast. Multiplanar CT image reconstructions and MIPs were obtained to evaluate the vascular anatomy. Carotid stenosis measurements (when applicable) are obtained utilizing NASCET criteria, using the distal internal carotid diameter as the denominator. CONTRAST:  20mL ISOVUE-370 IOPAMIDOL (ISOVUE-370) INJECTION 76% COMPARISON:  Head CT 06/10/2018.  No prior angiographic imaging. FINDINGS: CT HEAD FINDINGS Brain: There is no evidence of acute infarct, intracranial hemorrhage, mass, midline shift, or extra-axial fluid collection.  The ventricles and sulci are normal. Vascular: Evaluated below. Skull: No fracture or focal osseous lesion. Sinuses: Mild left ethmoid and maxillary sinus mucosal thickening. Clear mastoid air cells. Orbits: Unremarkable. Review of the MIP images confirms the above findings CTA NECK FINDINGS Aortic arch: Normal variant aortic arch branching pattern with common origin of the brachiocephalic and left common carotid arteries. Widely patent arch vessel origins. Right carotid system: Patent without evidence of stenosis, dissection, or  significant atherosclerosis. Left carotid system: Patent without evidence of stenosis, dissection, or significant atherosclerosis. Vertebral arteries: Patent without evidence of stenosis, dissection, or significant atherosclerosis. Moderately dominant left vertebral artery. Skeleton: Mild diffuse cervical spondylosis. No suspicious osseous lesion. Other neck: Asymmetric right styloid process enlargement and diffuse right stylohyoid ligament calcification with milder changes on the left, typically an incidental finding though can be seen with Eagle syndrome. No neck mass. Upper chest: Clear lung apices. Review of the MIP images confirms the above findings CTA HEAD FINDINGS Anterior circulation: The internal carotid arteries are patent from skull base to carotid termini with mild nonstenotic atherosclerosis bilaterally. ACAs and MCAs are patent with mild branch vessel irregularity but no evidence of proximal branch occlusion or significant proximal stenosis. The left A1 segment is absent. No aneurysm is identified. Posterior circulation: The intracranial vertebral arteries are patent to the basilar. Patent left PICA, bilateral AICA, and bilateral SCA origins are identified. The right PICA shares a common trunk with the right AICA. The basilar artery is widely patent. There are prominent bilateral posterior communicating arteries, left larger than right with absence of the left P1 segment. The PCAs are patent with branch vessel irregularity and attenuation bilaterally but no evidence of significant proximal stenosis. No aneurysm is identified. Venous sinuses: Patent. Anatomic variants: Fetal left PCA.  Absent left A1. Delayed phase: No abnormal enhancement. Review of the MIP images confirms the above findings IMPRESSION: 1. Mild intracranial atherosclerosis without large vessel occlusion, significant proximal stenosis, or aneurysm. 2. Widely patent cervical carotid and vertebral arteries. Electronically Signed   By:  Logan Bores M.D.   On: 06/10/2018 14:10   Ct Head Wo Contrast  Result Date: 06/10/2018 CLINICAL DATA:  67 year old male with multiple near syncopal episodes over the past 1.5 weeks. No known injury. EXAM: CT HEAD WITHOUT CONTRAST TECHNIQUE: Contiguous axial images were obtained from the base of the skull through the vertex without intravenous contrast. COMPARISON:  Body bone scan 01/27/2018. FINDINGS: Brain: Small focus of probable inconsequential dural thickening along the right superior frontal convexity on series 4, image 60. Cerebral volume is within normal limits for age. No midline shift, ventriculomegaly, mass effect, evidence of mass lesion, intracranial hemorrhage or evidence of cortically based acute infarction. Gray-white matter differentiation is within normal limits throughout the brain. No cortical encephalomalacia identified. Vascular: Mild Calcified atherosclerosis at the skull base. No suspicious intracranial vascular hyperdensity. Skull: Hypertrophied bilateral styloid process, stylohyoid ligament greater on the right (series 4, image 1). Otherwise negative. Sinuses/Orbits: Mild ethmoid and maxillary sinus mucosal thickening, otherwise well pneumatized. Tympanic cavities and mastoids are clear. Other: Visualized orbit soft tissues are within normal limits. No acute scalp soft tissue findings. IMPRESSION: 1.  Normal for age non contrast CT appearance of the brain. 2. Mild paranasal sinus mucosal thickening. Electronically Signed   By: Genevie Ann M.D.   On: 06/10/2018 12:52   Ct Angio Neck W And/or Wo Contrast  Result Date: 06/10/2018 CLINICAL DATA:  Syncope. EXAM: CT ANGIOGRAPHY HEAD AND NECK TECHNIQUE: Multidetector CT imaging of  the head and neck was performed using the standard protocol during bolus administration of intravenous contrast. Multiplanar CT image reconstructions and MIPs were obtained to evaluate the vascular anatomy. Carotid stenosis measurements (when applicable) are  obtained utilizing NASCET criteria, using the distal internal carotid diameter as the denominator. CONTRAST:  79mL ISOVUE-370 IOPAMIDOL (ISOVUE-370) INJECTION 76% COMPARISON:  Head CT 06/10/2018.  No prior angiographic imaging. FINDINGS: CT HEAD FINDINGS Brain: There is no evidence of acute infarct, intracranial hemorrhage, mass, midline shift, or extra-axial fluid collection. The ventricles and sulci are normal. Vascular: Evaluated below. Skull: No fracture or focal osseous lesion. Sinuses: Mild left ethmoid and maxillary sinus mucosal thickening. Clear mastoid air cells. Orbits: Unremarkable. Review of the MIP images confirms the above findings CTA NECK FINDINGS Aortic arch: Normal variant aortic arch branching pattern with common origin of the brachiocephalic and left common carotid arteries. Widely patent arch vessel origins. Right carotid system: Patent without evidence of stenosis, dissection, or significant atherosclerosis. Left carotid system: Patent without evidence of stenosis, dissection, or significant atherosclerosis. Vertebral arteries: Patent without evidence of stenosis, dissection, or significant atherosclerosis. Moderately dominant left vertebral artery. Skeleton: Mild diffuse cervical spondylosis. No suspicious osseous lesion. Other neck: Asymmetric right styloid process enlargement and diffuse right stylohyoid ligament calcification with milder changes on the left, typically an incidental finding though can be seen with Eagle syndrome. No neck mass. Upper chest: Clear lung apices. Review of the MIP images confirms the above findings CTA HEAD FINDINGS Anterior circulation: The internal carotid arteries are patent from skull base to carotid termini with mild nonstenotic atherosclerosis bilaterally. ACAs and MCAs are patent with mild branch vessel irregularity but no evidence of proximal branch occlusion or significant proximal stenosis. The left A1 segment is absent. No aneurysm is identified.  Posterior circulation: The intracranial vertebral arteries are patent to the basilar. Patent left PICA, bilateral AICA, and bilateral SCA origins are identified. The right PICA shares a common trunk with the right AICA. The basilar artery is widely patent. There are prominent bilateral posterior communicating arteries, left larger than right with absence of the left P1 segment. The PCAs are patent with branch vessel irregularity and attenuation bilaterally but no evidence of significant proximal stenosis. No aneurysm is identified. Venous sinuses: Patent. Anatomic variants: Fetal left PCA.  Absent left A1. Delayed phase: No abnormal enhancement. Review of the MIP images confirms the above findings IMPRESSION: 1. Mild intracranial atherosclerosis without large vessel occlusion, significant proximal stenosis, or aneurysm. 2. Widely patent cervical carotid and vertebral arteries. Electronically Signed   By: Logan Bores M.D.   On: 06/10/2018 14:10        Scheduled Meds: . aspirin EC  81 mg Oral Daily  . enoxaparin (LOVENOX) injection  40 mg Subcutaneous Q24H  . insulin aspart  0-15 Units Subcutaneous TID WC  . insulin aspart  0-5 Units Subcutaneous QHS  . sodium chloride flush  3 mL Intravenous Q12H   Continuous Infusions: . lactated ringers 100 mL/hr at 06/11/18 1248     LOS: 0 days   Time spent= 50 mins    June Vacha Arsenio Loader, MD [facility] Pager [phone]   If 7PM-7AM, please contact night-coverage [email or web address] Password [email or web address] 06/11/2018, 1:53 PM

## 2018-06-12 ENCOUNTER — Other Ambulatory Visit: Payer: Self-pay | Admitting: Medical

## 2018-06-12 DIAGNOSIS — R55 Syncope and collapse: Secondary | ICD-10-CM

## 2018-06-12 LAB — GLUCOSE, CAPILLARY: GLUCOSE-CAPILLARY: 142 mg/dL — AB (ref 70–99)

## 2018-06-12 MED ORDER — ASPIRIN 81 MG PO TBEC: 81.0000 mg | DELAYED_RELEASE_TABLET | Freq: Every day | ORAL | 0 refills | Status: DC

## 2018-06-12 NOTE — Discharge Summary (Signed)
Nathan [name] WEX:[id number] DOB: [birth year] DOA: 06/10/2018  PCP: Kristopher Glee., MD  Admit date: 06/10/2018  Discharge date: 06/12/2018  Admitted From: Home  Disposition:  Home   Recommendations for Outpatient Follow-up:   Follow up with PCP in 1-2 weeks  PCP Please obtain BMP/CBC, 2 view CXR in 1week,  (see Discharge instructions)   PCP Please follow up on the following pending results:    Home Health: None   Equipment/Devices: None  Consultations: None Discharge Condition: Stable   CODE STATUS: Full   Diet Recommendation: Heart Healthy Low Carb     Chief Complaint  Patient presents with  . Dizziness     Brief history of present illness from the day of admission and additional interim summary    67 year old with history of prostate cancer/adenocarcinoma status post resection, essential hypertension, hyperlipidemia came to the hospital with complaints of dizziness.  Patient states for the past 7-10 days he has been feeling head rush lasting for 5-10 seconds causing him to go blank without any loss of consciousness and immediately returns back to being normal.  At first this was happening once a day but over the last few days is been happening 2-3 times.  His neuro exam was unremarkable.  Vital signs show tachycardia.  CTA of the head and neck was negative.  Neurology recommended getting EEG which was unremarkable.                                                                 Hospital Course    1.  Intermittent head rash described by patient, hard to ascertain what he was alluding to.  Symptoms have completely resolved, had a thorough work-up which included MRI brain, EKG, EEG and echocardiogram which were all unremarkable, he was not orthostatic, he is ambulating today in the hallway without any  discomfort, his TSH and d-dimer were unremarkable as well.  The only thing that was slightly unusual was intermittent episodes of mild tachycardia which have since improved.  I have requested him to do a 30-day event monitor and follow with local cardiologist within a month.  He is currently symptom-free and will be discharged home.  Neurology was consulted over the phone by previous MD.  2.  Dyslipidemia and DM type II.  Stable follow with PCP and continue home medications unchanged.  3.  History of prostate adenocarcinoma.  Status post robot-assisted radical prostatectomy in 01/12/2018.  Follow with urologist and PCP unchanged.    Discharge diagnosis     Principal Problem:   Syncope and collapse Active Problems:   Hyperlipidemia   Controlled type 2 diabetes mellitus without complication (HCC)   Malignant neoplasm of prostate (HCC)   Postural dizziness with presyncope    Discharge instructions    Discharge Instructions  Discharge instructions   Complete by:  As directed    Follow with Primary MD Kristopher Glee., MD in 7 days   Get CBC, CMP checked  by Primary MD  in 5-7 days    Activity: As tolerated with Full fall precautions use walker/cane & assistance as needed  Disposition Home   Diet: Heart Healthy Low Carb.  Special Instructions: If you have smoked or chewed Tobacco  in the last 2 yrs please stop smoking, stop any regular Alcohol  and or any Recreational drug use.  On your next visit with your primary care physician please Get Medicines reviewed and adjusted.  Please request your Prim.MD to go over all Hospital Tests and Procedure/Radiological results at the follow up, please get all Hospital records sent to your Prim MD by signing hospital release before you go home.  If you experience worsening of your admission symptoms, develop shortness of breath, life threatening emergency, suicidal or homicidal thoughts you must seek medical attention immediately by calling  911 or calling your MD immediately  if symptoms less severe.  You Must read complete instructions/literature along with all the possible adverse reactions/side effects for all the Medicines you take and that have been prescribed to you. Take any new Medicines after you have completely understood and accpet all the possible adverse reactions/side effects.   Increase activity slowly   Complete by:  As directed       Discharge Medications   Allergies as of 06/12/2018   No Known Allergies     Medication List    TAKE these medications   aspirin 81 MG EC tablet Take 1 tablet (81 mg total) by mouth daily.   rosuvastatin 20 MG tablet Commonly known as:  CRESTOR Take 20 mg by mouth at bedtime.   sitaGLIPtin-metformin 50-1000 MG tablet Commonly known as:  JANUMET Take 1 tablet by mouth 2 (two) times daily with a meal.       Follow-up Information    Kristopher Glee., MD. Schedule an appointment as soon as possible for a visit in 1 week(s).   Specialty:  Internal Medicine Contact information: [address] [place] [address] [phone]           Major procedures and Radiology Reports - PLEASE review detailed and final reports thoroughly  -      EEG - Clinical Interpretation: This normal EEG is recorded in the waking and sleep state. There was no seizure or seizure predisposition recorded on this study. Please note that a normal EEG does not preclude the possibility of epilepsy.     ECHO  - Left ventricle: The cavity size was normal. Wall thickness was increased in a pattern of mild LVH. The estimated ejection   fraction was 55%. Wall motion was normal; there were no regional wall motion abnormalities. Doppler parameters are consistent with  abnormal left ventricular relaxation (grade 1 diastolic  dysfunction). - Aortic valve: There was no stenosis. - Mitral valve: There was no significant regurgitation. - Right ventricle: The cavity size was mildly  dilated. Systolic  function was normal. - Pulmonary arteries: No complete TR doppler jet so unable to  estimate PA systolic pressure. - Inferior vena cava: The vessel was normal in size. The respirophasic diameter changes were in the normal range (>= 50%),   consistent with normal central venous pressure.  Impressions:  - Normal LV size with mild LV hypertrophy. EF 55%. Mildly dilated RV with normal systolic function. No significant valvular  abnormalities.   Ct Angio Head W Or Wo Contrast  Result Date: 06/10/2018 CLINICAL DATA:  Syncope. EXAM: CT ANGIOGRAPHY HEAD AND NECK TECHNIQUE: Multidetector CT imaging of the head and neck was performed using the standard protocol during bolus administration of intravenous contrast. Multiplanar CT image reconstructions and MIPs were obtained to evaluate the vascular anatomy. Carotid stenosis measurements (when applicable) are obtained utilizing NASCET criteria, using the distal internal carotid diameter as the denominator. CONTRAST:  64mL ISOVUE-370 IOPAMIDOL (ISOVUE-370) INJECTION 76% COMPARISON:  Head CT 06/10/2018.  No prior angiographic imaging. FINDINGS: CT HEAD FINDINGS Brain: There is no evidence of acute infarct, intracranial hemorrhage, mass, midline shift, or extra-axial fluid collection. The ventricles and sulci are normal. Vascular: Evaluated below. Skull: No fracture or focal osseous lesion. Sinuses: Mild left ethmoid and maxillary sinus mucosal thickening. Clear mastoid air cells. Orbits: Unremarkable. Review of the MIP images confirms the above findings CTA NECK FINDINGS Aortic arch: Normal variant aortic arch branching pattern with common origin of the brachiocephalic and left common carotid arteries. Widely patent arch vessel origins. Right carotid system: Patent without evidence of stenosis, dissection, or significant atherosclerosis. Left carotid system: Patent without evidence of stenosis, dissection, or significant atherosclerosis.  Vertebral arteries: Patent without evidence of stenosis, dissection, or significant atherosclerosis. Moderately dominant left vertebral artery. Skeleton: Mild diffuse cervical spondylosis. No suspicious osseous lesion. Other neck: Asymmetric right styloid process enlargement and diffuse right stylohyoid ligament calcification with milder changes on the left, typically an incidental finding though can be seen with Eagle syndrome. No neck mass. Upper chest: Clear lung apices. Review of the MIP images confirms the above findings CTA HEAD FINDINGS Anterior circulation: The internal carotid arteries are patent from skull base to carotid termini with mild nonstenotic atherosclerosis bilaterally. ACAs and MCAs are patent with mild branch vessel irregularity but no evidence of proximal branch occlusion or significant proximal stenosis. The left A1 segment is absent. No aneurysm is identified. Posterior circulation: The intracranial vertebral arteries are patent to the basilar. Patent left PICA, bilateral AICA, and bilateral SCA origins are identified. The right PICA shares a common trunk with the right AICA. The basilar artery is widely patent. There are prominent bilateral posterior communicating arteries, left larger than right with absence of the left P1 segment. The PCAs are patent with branch vessel irregularity and attenuation bilaterally but no evidence of significant proximal stenosis. No aneurysm is identified. Venous sinuses: Patent. Anatomic variants: Fetal left PCA.  Absent left A1. Delayed phase: No abnormal enhancement. Review of the MIP images confirms the above findings IMPRESSION: 1. Mild intracranial atherosclerosis without large vessel occlusion, significant proximal stenosis, or aneurysm. 2. Widely patent cervical carotid and vertebral arteries. Electronically Signed   By: Logan Bores M.D.   On: 06/10/2018 14:10   Ct Head Wo Contrast  Result Date: 06/10/2018 CLINICAL DATA:  67 year old male with  multiple near syncopal episodes over the past 1.5 weeks. No known injury. EXAM: CT HEAD WITHOUT CONTRAST TECHNIQUE: Contiguous axial images were obtained from the base of the skull through the vertex without intravenous contrast. COMPARISON:  Body bone scan 01/27/2018. FINDINGS: Brain: Small focus of probable inconsequential dural thickening along the right superior frontal convexity on series 4, image 60. Cerebral volume is within normal limits for age. No midline shift, ventriculomegaly, mass effect, evidence of mass lesion, intracranial hemorrhage or evidence of cortically based acute infarction. Gray-white matter differentiation is within normal limits throughout the brain. No cortical encephalomalacia identified. Vascular: Mild Calcified atherosclerosis at the skull  base. No suspicious intracranial vascular hyperdensity. Skull: Hypertrophied bilateral styloid process, stylohyoid ligament greater on the right (series 4, image 1). Otherwise negative. Sinuses/Orbits: Mild ethmoid and maxillary sinus mucosal thickening, otherwise well pneumatized. Tympanic cavities and mastoids are clear. Other: Visualized orbit soft tissues are within normal limits. No acute scalp soft tissue findings. IMPRESSION: 1.  Normal for age non contrast CT appearance of the brain. 2. Mild paranasal sinus mucosal thickening. Electronically Signed   By: Genevie Ann M.D.   On: 06/10/2018 12:52   Ct Angio Neck W And/or Wo Contrast  Result Date: 06/10/2018 CLINICAL DATA:  Syncope. EXAM: CT ANGIOGRAPHY HEAD AND NECK TECHNIQUE: Multidetector CT imaging of the head and neck was performed using the standard protocol during bolus administration of intravenous contrast. Multiplanar CT image reconstructions and MIPs were obtained to evaluate the vascular anatomy. Carotid stenosis measurements (when applicable) are obtained utilizing NASCET criteria, using the distal internal carotid diameter as the denominator. CONTRAST:  41mL ISOVUE-370 IOPAMIDOL  (ISOVUE-370) INJECTION 76% COMPARISON:  Head CT 06/10/2018.  No prior angiographic imaging. FINDINGS: CT HEAD FINDINGS Brain: There is no evidence of acute infarct, intracranial hemorrhage, mass, midline shift, or extra-axial fluid collection. The ventricles and sulci are normal. Vascular: Evaluated below. Skull: No fracture or focal osseous lesion. Sinuses: Mild left ethmoid and maxillary sinus mucosal thickening. Clear mastoid air cells. Orbits: Unremarkable. Review of the MIP images confirms the above findings CTA NECK FINDINGS Aortic arch: Normal variant aortic arch branching pattern with common origin of the brachiocephalic and left common carotid arteries. Widely patent arch vessel origins. Right carotid system: Patent without evidence of stenosis, dissection, or significant atherosclerosis. Left carotid system: Patent without evidence of stenosis, dissection, or significant atherosclerosis. Vertebral arteries: Patent without evidence of stenosis, dissection, or significant atherosclerosis. Moderately dominant left vertebral artery. Skeleton: Mild diffuse cervical spondylosis. No suspicious osseous lesion. Other neck: Asymmetric right styloid process enlargement and diffuse right stylohyoid ligament calcification with milder changes on the left, typically an incidental finding though can be seen with Eagle syndrome. No neck mass. Upper chest: Clear lung apices. Review of the MIP images confirms the above findings CTA HEAD FINDINGS Anterior circulation: The internal carotid arteries are patent from skull base to carotid termini with mild nonstenotic atherosclerosis bilaterally. ACAs and MCAs are patent with mild branch vessel irregularity but no evidence of proximal branch occlusion or significant proximal stenosis. The left A1 segment is absent. No aneurysm is identified. Posterior circulation: The intracranial vertebral arteries are patent to the basilar. Patent left PICA, bilateral AICA, and bilateral SCA  origins are identified. The right PICA shares a common trunk with the right AICA. The basilar artery is widely patent. There are prominent bilateral posterior communicating arteries, left larger than right with absence of the left P1 segment. The PCAs are patent with branch vessel irregularity and attenuation bilaterally but no evidence of significant proximal stenosis. No aneurysm is identified. Venous sinuses: Patent. Anatomic variants: Fetal left PCA.  Absent left A1. Delayed phase: No abnormal enhancement. Review of the MIP images confirms the above findings IMPRESSION: 1. Mild intracranial atherosclerosis without large vessel occlusion, significant proximal stenosis, or aneurysm. 2. Widely patent cervical carotid and vertebral arteries. Electronically Signed   By: Logan Bores M.D.   On: 06/10/2018 14:10   Mr Brain Wo Contrast  Result Date: 06/11/2018 CLINICAL DATA:  Dizziness headache.  Hypertension hyperlipidemia. EXAM: MRI HEAD WITHOUT CONTRAST TECHNIQUE: Multiplanar, multiecho pulse sequences of the brain and surrounding structures were obtained without intravenous  contrast. COMPARISON:  CT head 06/10/2018 FINDINGS: Brain: No acute infarction, hemorrhage, hydrocephalus, extra-axial collection or mass lesion. Minimal chronic changes in the cerebral white matter bilaterally. Vascular: Normal arterial flow voids Skull and upper cervical spine: Negative Sinuses/Orbits: Negative Other: None IMPRESSION: No acute abnormality. 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 Electronically Signed   By: Franchot Gallo M.D.   On: 06/11/2018 14:47    Micro Results    No results found for this or any previous visit (from the past 240 hour(s)).  Today   Subjective    Jermell Cline today has no headache,no chest abdominal pain,no new weakness tingling or numbness, feels much better wants to go home today.    Objective   Blood pressure 110/61, pulse (!) 58, temperature  98.6 F (37 C), temperature source Oral, resp. rate 17, height 5\' 9"  (1.753 m), weight 98 kg, SpO2 98 %.   Intake/Output Summary (Last 24 hours) at 06/12/2018 0854 Last data filed at 06/12/2018 0600 Gross per 24 hour  Intake 2471.25 ml  Output 525 ml  Net 1946.25 ml    Exam  Awake Alert, Oriented x 3, No new F.N deficits, Normal affect Outlook.AT,PERRAL Supple Neck,No JVD, No cervical lymphadenopathy appriciated.  Symmetrical Chest wall movement, Good air movement bilaterally, CTAB RRR,No Gallops,Rubs or new Murmurs, No Parasternal Heave +ve B.Sounds, Abd Soft, Non tender, No organomegaly appriciated, No rebound -guarding or rigidity. No Cyanosis, Clubbing or edema, No new Rash or bruise   Data Review   CBC w Diff:  Lab Results  Component Value Date   WBC 7.0 06/11/2018   HGB 13.1 06/11/2018   HCT 42.7 06/11/2018   PLT 253 06/11/2018   LYMPHOPCT 11 (L) 05/05/2015   MONOPCT 5 05/05/2015   EOSPCT 0 05/05/2015   BASOPCT 0 05/05/2015    CMP:  Lab Results  Component Value Date   NA 138 06/11/2018   K 3.8 06/11/2018   CL 106 06/11/2018   CO2 26 06/11/2018   BUN 17 06/11/2018   CREATININE 1.14 06/11/2018   PROT 7.1 05/05/2015   ALBUMIN 3.6 05/05/2015   BILITOT 1.0 05/05/2015   ALKPHOS 61 05/05/2015   AST 20 05/05/2015   ALT 18 05/05/2015  . Lab Results  Component Value Date   TSH 0.656 06/10/2018    Lab Results  Component Value Date   HGBA1C 7.4 (H) 03/23/2018    Total Time in preparing paper work, data evaluation and todays exam - 61 minutes  Lala Lund M.D on 06/12/2018 at 8:54 AM  [facility]   Office  [phone]

## 2018-06-12 NOTE — Procedures (Addendum)
History: 67 year old male being evaluated for syncope Sedation: None  Technique: This is a 21 channel routine scalp EEG performed at the bedside with bipolar and monopolar montages arranged in accordance to the international 10/20 system of electrode placement. One channel was dedicated to EKG recording.    Background: The background consists of intermixed alpha and beta activities. There is a well defined posterior dominant rhythm of 12 Hz that attenuates with eye opening. Sleep is recorded with normal appearing structures.   Photic stimulation: Physiologic driving is present  EEG Abnormalities: None  Clinical Interpretation: This normal EEG is recorded in the waking and sleep state. There was no seizure or seizure predisposition recorded on this study. Please note that a normal EEG does not preclude the possibility of epilepsy.   Roland Rack, MD [facility] [phone]  If 7pm- 7am, please page neurology on call as listed in [place].

## 2018-06-12 NOTE — Evaluation (Signed)
Physical Therapy Evaluation Patient Details Name: Nathan [name] MRN: [id number] DOB: [birth year] Today's Date: 06/12/2018   History of Present Illness  Nathan [name] is a 67yo male who comes to [facility] on 10/16 after several episodes of 5-min duration LOC associated with visual loss. PTA pt is a healthy/activy community dwelling adult, works as a Clinical biochemist at hospital.   Clinical Impression  Pt received up to chair, reports being quite mobile since admission, AMB from [facility] to [place] with RN. Pt denies any acute impairement or changes to balance, strength, sensation, or other. Pt reports to be at baseline. Pt has no additional PT skilled needs at this time and will be DC from PT services. PT signing off.     Follow Up Recommendations No PT follow up    Equipment Recommendations  None recommended by PT    Recommendations for Other Services       Precautions / Restrictions Precautions Precautions: None Restrictions Weight Bearing Restrictions: No      Mobility  Bed Mobility Overal bed mobility: Independent                Transfers Overall transfer level: Independent                  Ambulation/Gait Ambulation/Gait assistance: Independent Gait Distance (Feet): 415 Feet Assistive device: IV Pole   Gait velocity: 1.32m/s       Stairs            Wheelchair Mobility    Modified Rankin (Stroke Patients Only)       Balance Overall balance assessment: Independent                                           Pertinent Vitals/Pain Pain Assessment: No/denies pain    Home Living Family/patient expects to be discharged to:: Private residence Living Arrangements: Spouse/significant other(wife went to [facility] 06/12/18) Available Help at Discharge: Family(Has a DTR in [place], DTR in [place], and Son in [place]. 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      Hand Dominance        Extremity/Trunk Assessment   Upper Extremity Assessment Upper Extremity Assessment: Overall WFL for tasks assessed    Lower Extremity Assessment Lower Extremity Assessment: Overall WFL for tasks assessed       Communication   Communication: No difficulties  Cognition Arousal/Alertness: Awake/alert Behavior During Therapy: WFL for tasks assessed/performed Overall Cognitive Status: Within Functional Limits for tasks assessed                                        General Comments      Exercises     Assessment/Plan    PT Assessment Patent does not need any further PT services  PT Problem List         PT Treatment Interventions      PT Goals (Current goals can be found in the Care Plan section)  Acute Rehab PT Goals PT Goal Formulation: All  assessment and education complete, DC therapy    Frequency     Barriers to discharge        Co-evaluation               AM-PAC PT "6 Clicks" Daily Activity  Outcome Measure Difficulty turning over in bed (including adjusting bedclothes, sheets and blankets)?: None Difficulty moving from lying on back to sitting on the side of the bed? : None Difficulty sitting down on and standing up from a chair with arms (e.g., wheelchair, bedside commode, etc,.)?: None Help needed moving to and from a bed to chair (including a wheelchair)?: None Help needed walking in hospital room?: None Help needed climbing 3-5 steps with a railing? : None 6 Click Score: 24    End of Session   Activity Tolerance: Patient tolerated treatment well Patient left: in chair;with call bell/phone within reach(meal tray presented. ) Nurse Communication: Mobility status      Time: 1610-9604 PT Time Calculation (min) (ACUTE  ONLY): 17 min   Charges:   PT Evaluation $PT Eval Low Complexity: 1 Low PT Treatments $Therapeutic Activity: 8-22 mins        9:33 AM, 06/12/18 Etta Grandchild, PT, DPT Physical Therapist - [place] [phone] (Pager)  [phone] (Office)     Cecelia Graciano C 06/12/2018, 9:30 AM

## 2018-06-12 NOTE — Discharge Instructions (Signed)
Follow with Primary MD Kristopher Glee., MD in 7 days   Get CBC, CMP checked  by Primary MD  in 5-7 days    Activity: As tolerated with Full fall precautions use walker/cane & assistance as needed  Disposition Home   Diet: Heart Healthy Low Carb.  Special Instructions: If you have smoked or chewed Tobacco  in the last 2 yrs please stop smoking, stop any regular Alcohol  and or any Recreational drug use.  On your next visit with your primary care physician please Get Medicines reviewed and adjusted.  Please request your Prim.MD to go over all Hospital Tests and Procedure/Radiological results at the follow up, please get all Hospital records sent to your Prim MD by signing hospital release before you go home.  If you experience worsening of your admission symptoms, develop shortness of breath, life threatening emergency, suicidal or homicidal thoughts you must seek medical attention immediately by calling 911 or calling your MD immediately  if symptoms less severe.  You Must read complete instructions/literature along with all the possible adverse reactions/side effects for all the Medicines you take and that have been prescribed to you. Take any new Medicines after you have completely understood and accpet all the possible adverse reactions/side effects.                                                          Nathan [name] was admitted to the Hospital on 06/10/2018 and Discharged  06/12/2018 and should be excused from work/school   for 4 days starting from date -  06/10/2018 , may return to work/school without any restrictions.  Call Lala Lund MD, [facility]  [phone] with questions.  Lala Lund M.D on 06/12/2018,at 8:52 AM  [facility]   Office  [phone]

## 2018-06-15 ENCOUNTER — Telehealth: Payer: Self-pay | Admitting: Cardiology

## 2018-06-15 NOTE — Telephone Encounter (Signed)
Encounter not needed

## 2018-06-17 DIAGNOSIS — M6281 Muscle weakness (generalized): Secondary | ICD-10-CM | POA: Diagnosis not present

## 2018-06-17 DIAGNOSIS — N393 Stress incontinence (female) (male): Secondary | ICD-10-CM | POA: Diagnosis not present

## 2018-06-17 DIAGNOSIS — M62838 Other muscle spasm: Secondary | ICD-10-CM | POA: Diagnosis not present

## 2018-06-22 DIAGNOSIS — E119 Type 2 diabetes mellitus without complications: Secondary | ICD-10-CM | POA: Diagnosis not present

## 2018-06-22 DIAGNOSIS — C61 Malignant neoplasm of prostate: Secondary | ICD-10-CM | POA: Diagnosis not present

## 2018-06-22 DIAGNOSIS — R42 Dizziness and giddiness: Secondary | ICD-10-CM | POA: Diagnosis not present

## 2018-06-22 DIAGNOSIS — J9811 Atelectasis: Secondary | ICD-10-CM | POA: Diagnosis not present

## 2018-06-22 DIAGNOSIS — R55 Syncope and collapse: Secondary | ICD-10-CM | POA: Diagnosis not present

## 2018-06-23 ENCOUNTER — Ambulatory Visit (INDEPENDENT_AMBULATORY_CARE_PROVIDER_SITE_OTHER): Payer: PPO

## 2018-06-23 DIAGNOSIS — R55 Syncope and collapse: Secondary | ICD-10-CM

## 2018-07-01 DIAGNOSIS — C61 Malignant neoplasm of prostate: Secondary | ICD-10-CM | POA: Diagnosis not present

## 2018-07-07 ENCOUNTER — Encounter: Payer: Self-pay | Admitting: Cardiology

## 2018-07-07 ENCOUNTER — Ambulatory Visit (INDEPENDENT_AMBULATORY_CARE_PROVIDER_SITE_OTHER): Payer: PPO | Admitting: Cardiology

## 2018-07-07 VITALS — BP 123/81 | HR 100 | Ht 70.0 in | Wt 222.2 lb

## 2018-07-07 DIAGNOSIS — Z79899 Other long term (current) drug therapy: Secondary | ICD-10-CM

## 2018-07-07 DIAGNOSIS — R42 Dizziness and giddiness: Secondary | ICD-10-CM

## 2018-07-07 DIAGNOSIS — E782 Mixed hyperlipidemia: Secondary | ICD-10-CM | POA: Diagnosis not present

## 2018-07-07 DIAGNOSIS — N393 Stress incontinence (female) (male): Secondary | ICD-10-CM | POA: Diagnosis not present

## 2018-07-07 DIAGNOSIS — Z7189 Other specified counseling: Secondary | ICD-10-CM | POA: Diagnosis not present

## 2018-07-07 DIAGNOSIS — E119 Type 2 diabetes mellitus without complications: Secondary | ICD-10-CM | POA: Diagnosis not present

## 2018-07-07 DIAGNOSIS — Z6831 Body mass index (BMI) 31.0-31.9, adult: Secondary | ICD-10-CM

## 2018-07-07 DIAGNOSIS — E6609 Other obesity due to excess calories: Secondary | ICD-10-CM | POA: Diagnosis not present

## 2018-07-07 DIAGNOSIS — C61 Malignant neoplasm of prostate: Secondary | ICD-10-CM | POA: Diagnosis not present

## 2018-07-07 DIAGNOSIS — N5201 Erectile dysfunction due to arterial insufficiency: Secondary | ICD-10-CM | POA: Diagnosis not present

## 2018-07-07 NOTE — Patient Instructions (Addendum)
Medication Instructions:  Stop: Aspirin  Restart: Crestor 20 mg If you need a refill on your cardiac medications before your next appointment, please call your pharmacy.   Lab work: None   Testing/Procedures: None  Follow-Up: At [facility], you and your health needs are our priority.  As part of our continuing mission to provide you with exceptional heart care, we have created designated Provider Care Teams.  These Care Teams include your primary Cardiologist (physician) and Advanced Practice Providers (APPs -  Physician Assistants and Nurse Practitioners) who all work together to provide you with the care you need, when you need it. You will need a follow up appointment in 2-3 months.  Please call our office 2 months in advance to schedule this appointment.  You may see Dr. Harrell Gave or one of the following Advanced Practice Providers on your designated Care Team:   Rosaria Ferries, PA-C . Jory Sims, DNP, ANP  Any Other Special Instructions Will Be Listed Below (If Applicable).

## 2018-07-07 NOTE — Progress Notes (Signed)
Cardiology Office Note:    Date:  07/07/2018   ID:  Nathan [name], DOB 11/20/1950, MRN [id number]  PCP:  Nathan [name]., MD  Cardiologist:  Nathan Dresser, MD PhD  Referring MD: Nathan [name]., MD   CC: new patient visit  History of Present Illness:    Nathan [name] is a 67 y.o. male with a hx of hypertension, hyperlipidemia, type II diabetes, prostate cancer s/p resection who is seen as a new consult at the request of Nathan [name]., MD for the evaluation and management of dizziness. He was discharged from the hospital on 06/12/18 after ~2 weeks of dizziness symptoms without LOC but several seconds of "going blank." Neuro workup unremarkable, including CTA head and neck and EEG. Echo also unremarkable. Was not orthostatic in the hospital, but appeared to have intermittent sinus tachycardia.  Tachycardia/palpitations: -Initial onset: early October (about 10 days after the flu shot) -Frequency: intially every other day, then a few times/day around the time of hospitalization, now about twice/day (though now he feels like it could come on any time) -Duration: 2-3 seconds -Triggers: no clear triggers -Aggravating/alleviating factors: no clear -Syncope/near syncope: near syncope twice (night before, and the morning of the day he went to the hospital), no complete syncope -Prior cardiac history: none -Prior workup: CTA, MRI, EEG, echo -Prior treatment: none -Possible medication interactions: none -Caffeine: very minimal -Alcohol: none -Tobacco: none -OTC supplements: no potential interactions -Comorbidities: hypertension, hyperlipidemia, type II diabetes -Exercise level: ambulates routinely for his job without difficulty -Cardiac ROS: no chest pain, no shortness of breath, no PND, no orthopnea, no LE edema.  Had bone aches with rosuvastatin 20 mg, stopped when he stopped taking it. Watching his diet. Feels that he recovered well from his prostate cancer surgery in  August.   Past Medical History:  Diagnosis Date  . Diabetes mellitus without complication ([facility])    type 2  . H/O cardiac catheterization   . Hyperlipidemia   . Malignant neoplasm of prostate ([place]) 02/05/2018    Past Surgical History:  Procedure Laterality Date  . colonscopy    . LYMPHADENECTOMY Bilateral 03/30/2018   Procedure: LYMPHADENECTOMY, PELVIC;  Surgeon: Raynelle Bring, MD;  Location: [facility];  Service: Urology;  Laterality: Bilateral;  . PROSTATE BIOPSY  01/2018  . ROBOT ASSISTED LAPAROSCOPIC RADICAL PROSTATECTOMY N/A 03/30/2018   Procedure: XI ROBOTIC ASSISTED LAPAROSCOPIC RADICAL PROSTATECTOMY LEVEL 3;  Surgeon: Raynelle Bring, MD;  Location: [facility];  Service: Urology;  Laterality: N/A;  ONLY NEEDS 210 MIN TOTAL FOR PROCEDURE  . vasedtomy  1986    Current Medications: Current Outpatient Medications on File Prior to Visit  Medication Sig  . aspirin EC 81 MG EC tablet Take 1 tablet (81 mg total) by mouth daily.  . rosuvastatin (CRESTOR) 20 MG tablet Take 20 mg by mouth at bedtime.   . sitaGLIPtin-metformin (JANUMET) 50-1000 MG tablet Take 1 tablet by mouth 2 (two) times daily with a meal.   No current facility-administered medications on file prior to visit.      Allergies:   Patient has no known allergies.   Social History   Socioeconomic History  . Marital status: Married    Spouse name: Not on file  . Number of children: Not on file  . Years of education: Not on file  . Highest education level: Not on file  Occupational History  . Occupation: chaplin  Social Needs  . Financial resource strain: Not on file  . Food insecurity:  Worry: Not on file    Inability: Not on file  . Transportation needs:    Medical: Not on file    Non-medical: Not on file  Tobacco Use  . Smoking status: Never Smoker  . Smokeless tobacco: Never Used  Substance and Sexual Activity  . Alcohol use: Never    Alcohol/week: 0.0 standard drinks    Frequency: Never  . Drug use: Never    . Sexual activity: Yes  Lifestyle  . Physical activity:    Days per week: Not on file    Minutes per session: Not on file  . Stress: Not on file  Relationships  . Social connections:    Talks on phone: Not on file    Gets together: Not on file    Attends religious service: Not on file    Active member of club or organization: Not on file    Attends meetings of clubs or organizations: Not on file    Relationship status: Not on file  Other Topics Concern  . Not on file  Social History Narrative  . Not on file     Family History: The patient's family history includes Cancer in his mother.  ROS:   Please see the history of present illness.  Additional pertinent ROS:  Constitutional: Negative for chills, fever, night sweats, unintentional weight loss  HENT: Negative for ear pain and hearing loss.   Eyes: Negative for loss of vision and eye pain.  Respiratory: Negative for cough, sputum, shortness of breath, wheezing.   Cardiovascular: Negative for chest pain, palpitations, PND, orthopnea, lower extremity edema and claudication.  Gastrointestinal: Negative for abdominal pain, melena, and hematochezia.  Genitourinary: Negative for dysuria and hematuria.  Musculoskeletal: Negative for falls and myalgias.  Skin: Negative for itching and rash.  Neurological: Negative for focal weakness, focal sensory changes and loss of consciousness. Positive for lightheadedness/dizziness Endo/Heme/Allergies: Does not bruise/bleed easily.    EKGs/Labs/Other Studies Reviewed:    The following studies were reviewed today: Echo 06/11/18 Study Conclusions  - Left ventricle: The cavity size was normal. Wall thickness was   increased in a pattern of mild LVH. The estimated ejection   fraction was 55%. Wall motion was normal; there were no regional   wall motion abnormalities. Doppler parameters are consistent with   abnormal left ventricular relaxation (grade 1 diastolic   dysfunction). -  Aortic valve: There was no stenosis. - Mitral valve: There was no significant regurgitation. - Right ventricle: The cavity size was mildly dilated. Systolic   function was normal. - Pulmonary arteries: No complete TR doppler jet so unable to   estimate PA systolic pressure. - Inferior vena cava: The vessel was normal in size. The   respirophasic diameter changes were in the normal range (>= 50%),   consistent with normal central venous pressure.  Impressions:  - Normal LV size with mild LV hypertrophy. EF 55%. Mildly dilated   RV with normal systolic function. No significant valvular   abnormalities.  Monitor still being worn, results not yet available  EKG:  EKG is personally reviewed.  The ekg ordered 10/16 demonstrates sinus rhythm with a heart rate of 96 (other ECG from that day has significant artifact)  Recent Labs: 06/10/2018: TSH 0.656 06/11/2018: BUN 17; Creatinine, Ser 1.14; Hemoglobin 13.1; Platelets 253; Potassium 3.8; Sodium 138  Recent Lipid Panel    Component Value Date/Time   CHOL  10/04/2008 1045    176        ATP III CLASSIFICATION:  <  200     mg/dL   Desirable  200-239  mg/dL   Borderline High  >=240    mg/dL   High          TRIG 176 (H) 10/04/2008 1045   HDL 22 (L) 10/04/2008 1045   CHOLHDL 8.0 10/04/2008 1045   VLDL 35 10/04/2008 1045   LDLCALC (H) 10/04/2008 1045    119        Total Cholesterol/HDL:CHD Risk Coronary Heart Disease Risk Table                     Men   Women  1/2 Average Risk   3.4   3.3  Average Risk       5.0   4.4  2 X Average Risk   9.6   7.1  3 X Average Risk  23.4   11.0        Use the calculated Patient Ratio above and the CHD Risk Table to determine the patient's CHD Risk.        ATP III CLASSIFICATION (LDL):  <100     mg/dL   Optimal  100-129  mg/dL   Near or Above                    Optimal  130-159  mg/dL   Borderline  160-189  mg/dL   High  >190     mg/dL   Very High    Physical Exam:    VS:  BP 123/81 (BP  Location: Right Arm)   Pulse 100   Ht 5\' 10"  (1.778 m)   Wt 222 lb 3.2 oz (100.8 kg)   BMI 31.88 kg/m    Orthostatics: Lying: 117/81, HR 105 Sitting: 124/79, HR 96 Standing: 122/82, HR 112 3 min standing: 133/85, HR 108  Wt Readings from Last 3 Encounters:  06/11/18 216 lb (98 kg)  03/30/18 225 lb 11.2 oz (102.4 kg)  03/23/18 225 lb (102.1 kg)     GEN: Well nourished, well developed in no acute distress HEENT: Normal NECK: No JVD; No carotid bruits LYMPHATICS: No lymphadenopathy CARDIAC: regular rhythm, normal S1 and S2, no murmurs, rubs, gallops. Radial and DP pulses 2+ bilaterally. RESPIRATORY:  Clear to auscultation without rales, wheezing or rhonchi  ABDOMEN: Soft, non-tender, non-distended MUSCULOSKELETAL:  No edema; No deformity  SKIN: Warm and dry NEUROLOGIC:  Alert and oriented x 3 PSYCHIATRIC:  Normal affect   ASSESSMENT:    1. Intermittent lightheadedness   2. Mixed hyperlipidemia   3. Controlled type 2 diabetes mellitus without complication, without long-term current use of insulin ([place])   4. Counseling on health promotion and disease prevention   5. Medication management   6. Class 1 obesity due to excess calories with serious comorbidity and body mass index (BMI) of 31.0 to 31.9 in adult    PLAN:    1. Lightheadedness/dizziness: unclear etiology. Not orthostatic. Neuro workup with CT, MRI, EEG unrevealing. Echo unremarkable. No clear triggers or aggravating/alleviating factors.  -while he does not have palpitations, the monitor will allow [place] to rule out arrhythmia as a cause -no recent illnesses, though temporally it was after he received the flu shot -he also recently had prostate cancer surgery in August, though this is a delayed symptom -thus far, life threatening etiologies have been excluded. Will continue to monitor symptoms. If symptoms worsen or he has full syncope, he should let me know and/or seek urgent medical attention.  2. Prevention  counseling and health management discussed at length today -recommend heart healthy/Mediterranean diet, with whole grains, fruits, vegetable, fish, lean meats, nuts, and olive oil. Limit salt. -recommend moderate walking, 3-5 times/week for 30-50 minutes each session. Aim for at least 150 minutes.week. Goal should be pace of 3 miles/hours, or walking 1.5 miles in 30 minutes -recommend avoidance of tobacco products. Avoid excess alcohol. -Additional risk factor control:  -Diabetes: Type II diabetes without complications. A1c is 7.4. On sitagliptin-meformin. Tolerating well. Would consider SGLT2i in the future if additional agent needed.  -Lipids: Mixed hyperlipidemia. Elevated ASCVD risk score based on prior lipids. He believes that his PCP recently checked these and will get the information. Stopped rosuvastatin due to aches. He is willing to trial again. My suggestion would be, if he wants to be on a single prevention medication, statins have more benefit and less side effects long term than aspirin. I would stop aspirin at the end of 30 days. I recommend restarting the rosuvastatin today. If the side effects are not tolerable, we will discuss additional options in the future.   -Blood pressure control: at goal, on no medications. If BP rises to 130/80, would start an ACEI/ARB given his diabetes.  -Weight: BMI 31.9, class 1 obesity. Lifestyle discussed. -ASCVD risk score: The 10-year ASCVD risk score Mikey Bussing DC Brooke Bonito., et al., 2013) is: 18.8%   Values used to calculate the score:     Age: 2 years     Sex: Male     Is Non-Hispanic African American: Yes     Diabetic: Yes     Tobacco smoker: No     Systolic Blood Pressure: 202 mmHg     Is BP treated: No     HDL Cholesterol: 41 MG/DL     Total Cholesterol: 212 MG/DL   Vitamin D: checked per PCP  TIME SPENT WITH PATIENT: >60 minutes of direct patient care. More than 50% of that time was spent on coordination of care and counseling regarding symptoms  and workup/results to date, mechanism of statins, prevention recommendations, risk factor counseling.  Plan for follow up: 2-3 months  Medication Adjustments/Labs and Tests Ordered: Current medicines are reviewed at length with the patient today.  Concerns regarding medicines are outlined above.  No orders of the defined types were placed in this encounter.  No orders of the defined types were placed in this encounter.   Patient Instructions  Medication Instructions:  Stop: Aspirin  Restart: Crestor 20 mg If you need a refill on your cardiac medications before your next appointment, please call your pharmacy.   Lab work: None   Testing/Procedures: None  Follow-Up: At [facility], you and your health needs are our priority.  As part of our continuing mission to provide you with exceptional heart care, we have created designated Provider Care Teams.  These Care Teams include your primary Cardiologist (physician) and Advanced Practice Providers (APPs -  Physician Assistants and Nurse Practitioners) who all work together to provide you with the care you need, when you need it. You will need a follow up appointment in 2-3 months.  Please call our office 2 months in advance to schedule this appointment.  You may see Dr. Harrell Gave or one of the following Advanced Practice Providers on your designated Care Team:   Rosaria Ferries, PA-C . Jory Sims, DNP, ANP  Any Other Special Instructions Will Be Listed Below (If Applicable).       Signed, Nathan Dresser, MD PhD 07/07/2018 7:44 AM  [facility]

## 2018-07-08 ENCOUNTER — Encounter: Payer: Self-pay | Admitting: Cardiology

## 2018-07-15 DIAGNOSIS — N393 Stress incontinence (female) (male): Secondary | ICD-10-CM | POA: Diagnosis not present

## 2018-07-15 DIAGNOSIS — M6281 Muscle weakness (generalized): Secondary | ICD-10-CM | POA: Diagnosis not present

## 2018-07-15 DIAGNOSIS — M62838 Other muscle spasm: Secondary | ICD-10-CM | POA: Diagnosis not present

## 2018-07-16 ENCOUNTER — Telehealth: Payer: Self-pay | Admitting: Cardiology

## 2018-07-16 NOTE — Telephone Encounter (Signed)
Spoke with pt who states he continuesto have near syncopal episodes. He report he had 4 yesterday and 2 today. Pt states he was advised by a critical care doctor to make sure he is not in afib and to maybe have Dr. Harrell Gave send a referral for a neurologist. Advised Pt that per OV note, it showed his heart was in a NSR with HR 98 but are still awaiting monitor report.  Pt state when he had one of the episodes today, his BP was 150/92 pulse 107. Pt scheduled an appointment to see Dr. Harrell Gave tomorrow 11/22 at 1020 for further evaluations.

## 2018-07-16 NOTE — Telephone Encounter (Signed)
We can attempt to get his monitor results, though they do not yet appear as available to read on my list. His cardiac workup otherwise has been unremarkable. Agree with limiting his activity and driving. If his symptoms progress or he loses consciousness, he should present to the ER for further evaluation. Thank you.

## 2018-07-16 NOTE — Telephone Encounter (Signed)
°  STAT if patient feels like he/she is going to faint   1) Are you dizzy now? YES  2) Do you feel faint or have you passed out? FEELS FAINT  3) Do you have any other symptoms? HEAD FEELS "FULL"  4) Have you checked your HR and BP (record if available)? Yesterday  HR 111

## 2018-07-16 NOTE — Telephone Encounter (Signed)
Pt updated with Dr. Judeth Cornfield recommendation. Pt verbalized understanding.

## 2018-07-16 NOTE — Telephone Encounter (Signed)
Patient called into today- states he has had 4 episodes in the past 2 days- 3  Yesterday , 1 today. Feels as if he going to faint- patient states he was just sitting or standing with each  Episodes. patient states he did not pass out .  He returned monitor in las week after appointment with Dr Harrell Gave. RN  INFORMED PATIENT TO LIMIT HIS DRIVING- PATIENT STATES HE HAS.   patient aware will defer to Dr Harrell Gave and contact him back with information

## 2018-07-17 ENCOUNTER — Telehealth: Payer: Self-pay | Admitting: Cardiology

## 2018-07-17 ENCOUNTER — Encounter: Payer: Self-pay | Admitting: Cardiology

## 2018-07-17 ENCOUNTER — Ambulatory Visit: Payer: PPO | Admitting: Cardiology

## 2018-07-17 ENCOUNTER — Encounter: Payer: Self-pay | Admitting: Neurology

## 2018-07-17 VITALS — BP 138/80 | HR 118 | Resp 16 | Ht 69.0 in | Wt 221.0 lb

## 2018-07-17 DIAGNOSIS — R42 Dizziness and giddiness: Secondary | ICD-10-CM

## 2018-07-17 NOTE — Telephone Encounter (Signed)
New Message   Patient is calling because he was given a referral for [facility] but they are unable to see him until 1/30. He is wanting to know can a referral be sent to [facility] so that maybe they can get him in sooner. Please call to discuss.

## 2018-07-17 NOTE — Telephone Encounter (Signed)
New Message:     Pt is calling in reference to his referral to [facility] and he would like to know if the referral can be sent to Dr. Delice Lesch a dr part of [place] instead. He said if we can call him back and let him know when it's been sent and leave a vcmail if he does not answer. He also states if this is not possible then just leave it as is.

## 2018-07-17 NOTE — Telephone Encounter (Signed)
Returned call to patient he stated he cannot see Dr.Aquino until 09/24/18.Stated he wants to see Neurologist sooner.Stated he will go to [place].Advised I will send message to Dr.Christopher's RN.

## 2018-07-17 NOTE — Patient Instructions (Signed)
Medication Instructions:  Your Physician recommend you continue on your current medication as directed.    If you need a refill on your cardiac medications before your next appointment, please call your pharmacy.   Lab work: None   Testing/Procedures: None  Follow-Up: Your physician recommends that you keep scheduled follow up appointment.   Any Other Special Instructions Will Be Listed Below (If Applicable).

## 2018-07-17 NOTE — Progress Notes (Signed)
Cardiology Office Note:    Date:  07/17/2018   ID:  Nathan [name], DOB 1951-08-11, MRN [id number]  PCP:  Kristopher Glee., MD  Cardiologist:  Buford Dresser, MD PhD  Referring MD: Kristopher Glee., MD   CC: near syncope  History of Present Illness:    Nathan [name] is a 67 y.o. male with a hx of hypertension, hyperlipidemia, type II diabetes, prostate cancer s/p resection who is seen as an urgent follow up for the evaluation and management of dizziness. I initially saw him in the office on 07/07/18.  Prior history: He was discharged from the hospital on 06/12/18 after ~2 weeks of dizziness symptoms without LOC but several seconds of "going blank." Neuro workup unremarkable, including CTA head and neck and EEG. Echo also unremarkable. Was not orthostatic in the hospital, but appeared to have intermittent sinus tachycardia. Initial onset: early October (about 10 days after the flu shot). Frequency: intially every other day, then a few times/day around the time of hospitalization, now about twice/day (though now he feels like it could come on any time). Lasts several seconds, no clear triggers or aggravating/alleviating factors.  Today: did well for 4-5 days after his most recent visit, but started having episodes again 2 days ago. No full syncope. No vertigo. Feels like "fuzz" in his head. Usually when he is sitting down. Has happened multiple times/day. He returned his monitor after our last visit, awaiting results.  Past Medical History:  Diagnosis Date  . Diabetes mellitus without complication ([place])    type 2  . H/O cardiac catheterization   . Hyperlipidemia   . Malignant neoplasm of prostate ([place]) 02/05/2018    Past Surgical History:  Procedure Laterality Date  . colonscopy    . LYMPHADENECTOMY Bilateral 03/30/2018   Procedure: LYMPHADENECTOMY, PELVIC;  Surgeon: Raynelle Bring, MD;  Location: [facility];  Service: Urology;  Laterality: Bilateral;  . PROSTATE BIOPSY  01/2018    . ROBOT ASSISTED LAPAROSCOPIC RADICAL PROSTATECTOMY N/A 03/30/2018   Procedure: XI ROBOTIC ASSISTED LAPAROSCOPIC RADICAL PROSTATECTOMY LEVEL 3;  Surgeon: Raynelle Bring, MD;  Location: [facility];  Service: Urology;  Laterality: N/A;  ONLY NEEDS 210 MIN TOTAL FOR PROCEDURE  . vasedtomy  1986    Current Medications: Current Outpatient Medications on File Prior to Visit  Medication Sig  . rosuvastatin (CRESTOR) 20 MG tablet Take 20 mg by mouth at bedtime.   . sitaGLIPtin-metformin (JANUMET) 50-1000 MG tablet Take 1 tablet by mouth 2 (two) times daily with a meal.   No current facility-administered medications on file prior to visit.      Allergies:   Patient has no known allergies.   Social History   Socioeconomic History  . Marital status: Married    Spouse name: Not on file  . Number of children: Not on file  . Years of education: Not on file  . Highest education level: Not on file  Occupational History  . Occupation: chaplin  Social Needs  . Financial resource strain: Not on file  . Food insecurity:    Worry: Not on file    Inability: Not on file  . Transportation needs:    Medical: Not on file    Non-medical: Not on file  Tobacco Use  . Smoking status: Never Smoker  . Smokeless tobacco: Never Used  Substance and Sexual Activity  . Alcohol use: Never    Alcohol/week: 0.0 standard drinks    Frequency: Never  . Drug use: Never  . Sexual  activity: Yes  Lifestyle  . Physical activity:    Days per week: Not on file    Minutes per session: Not on file  . Stress: Not on file  Relationships  . Social connections:    Talks on phone: Not on file    Gets together: Not on file    Attends religious service: Not on file    Active member of club or organization: Not on file    Attends meetings of clubs or organizations: Not on file    Relationship status: Not on file  Other Topics Concern  . Not on file  Social History Narrative  . Not on file     Family History: The  patient's family history includes Cancer in his mother.  ROS:   Please see the history of present illness.  Additional pertinent ROS:  Constitutional: Negative for chills, fever, night sweats, unintentional weight loss  HENT: Negative for ear pain and hearing loss.   Eyes: Negative for loss of vision and eye pain.  Respiratory: Negative for cough, sputum, shortness of breath, wheezing.   Cardiovascular: Negative for chest pain, palpitations, PND, orthopnea, lower extremity edema and claudication.  Gastrointestinal: Negative for abdominal pain, melena, and hematochezia.  Genitourinary: Negative for dysuria and hematuria.  Musculoskeletal: Negative for falls and myalgias.  Skin: Negative for itching and rash.  Neurological: Negative for focal weakness, focal sensory changes and loss of consciousness. Positive for lightheadedness/dizziness Endo/Heme/Allergies: Does not bruise/bleed easily.    EKGs/Labs/Other Studies Reviewed:    The following studies were reviewed today: Echo 06/11/18 Study Conclusions  - Left ventricle: The cavity size was normal. Wall thickness was   increased in a pattern of mild LVH. The estimated ejection   fraction was 55%. Wall motion was normal; there were no regional   wall motion abnormalities. Doppler parameters are consistent with   abnormal left ventricular relaxation (grade 1 diastolic   dysfunction). - Aortic valve: There was no stenosis. - Mitral valve: There was no significant regurgitation. - Right ventricle: The cavity size was mildly dilated. Systolic   function was normal. - Pulmonary arteries: No complete TR doppler jet so unable to   estimate PA systolic pressure. - Inferior vena cava: The vessel was normal in size. The   respirophasic diameter changes were in the normal range (>= 50%),   consistent with normal central venous pressure.  Impressions:  - Normal LV size with mild LV hypertrophy. EF 55%. Mildly dilated   RV with normal  systolic function. No significant valvular   abnormalities.  Monitor results not yet available  EKG:  EKG is personally reviewed.  The ekg today shows sinus tachycardia at 103 bpm.  Recent Labs: 06/10/2018: TSH 0.656 06/11/2018: BUN 17; Creatinine, Ser 1.14; Hemoglobin 13.1; Platelets 253; Potassium 3.8; Sodium 138  Recent Lipid Panel    Component Value Date/Time   CHOL  10/04/2008 1045    176        ATP III CLASSIFICATION:  <200     mg/dL   Desirable  200-239  mg/dL   Borderline High  >=240    mg/dL   High          TRIG 176 (H) 10/04/2008 1045   HDL 22 (L) 10/04/2008 1045   CHOLHDL 8.0 10/04/2008 1045   VLDL 35 10/04/2008 1045   LDLCALC (H) 10/04/2008 1045    119        Total Cholesterol/HDL:CHD Risk Coronary Heart Disease Risk Table  Men   Women  1/2 Average Risk   3.4   3.3  Average Risk       5.0   4.4  2 X Average Risk   9.6   7.1  3 X Average Risk  23.4   11.0        Use the calculated Patient Ratio above and the CHD Risk Table to determine the patient's CHD Risk.        ATP III CLASSIFICATION (LDL):  <100     mg/dL   Optimal  100-129  mg/dL   Near or Above                    Optimal  130-159  mg/dL   Borderline  160-189  mg/dL   High  >190     mg/dL   Very High    Physical Exam:    VS:  BP 138/80 (BP Location: Right Arm, Cuff Size: Normal)   Pulse (!) 118   Resp 16   Ht 5\' 9"  (1.753 m)   Wt 221 lb (100.2 kg)   SpO2 98%   BMI 32.64 kg/m   Orthostatic VS for the past 24 hrs (Last 3 readings):  BP- Lying Pulse- Lying BP- Sitting Pulse- Sitting BP- Standing at 0 minutes Pulse- Standing at 0 minutes  07/17/18 1041 131/76 106 129/80 112 127/81 115   Wt Readings from Last 3 Encounters:  07/17/18 221 lb (100.2 kg)  07/07/18 222 lb 3.2 oz (100.8 kg)  06/11/18 216 lb (98 kg)     GEN: Well nourished, well developed in no acute distress HEENT: Normal NECK: No JVD; No carotid bruits LYMPHATICS: No lymphadenopathy CARDIAC: regular  rhythm, normal S1 and S2, no murmurs, rubs, gallops. Radial and DP pulses 2+ bilaterally. RESPIRATORY:  Clear to auscultation without rales, wheezing or rhonchi  ABDOMEN: Soft, non-tender, non-distended MUSCULOSKELETAL:  No edema; No deformity  SKIN: Warm and dry NEUROLOGIC:  Alert and oriented x 3 PSYCHIATRIC:  Normal affect   ASSESSMENT:    1. Intermittent lightheadedness    PLAN:    1. Lightheadedness/dizziness: unclear etiology. Not orthostatic. Neuro workup with CT, MRI, EEG unrevealing. Echo unremarkable. No clear triggers or aggravating/alleviating factors. Now recurrent -while he does not have palpitations, the monitor will allow [place] to rule out arrhythmia as a cause. We have called to see if we can get the report expedited, but it is not currently available. -he was instructed to follow up with neurology post hospitalization but was never contacted. Sent referral to neurology today.  -thus far, no clear cardiac etiology. If monitor shows only sinus tachycardia, this is likely secondary and not a primary cause.  Not discussed in depth today: Prevention counseling and health management discussed at length today -recommend heart healthy/Mediterranean diet, with whole grains, fruits, vegetable, fish, lean meats, nuts, and olive oil. Limit salt. -recommend moderate walking, 3-5 times/week for 30-50 minutes each session. Aim for at least 150 minutes.week. Goal should be pace of 3 miles/hours, or walking 1.5 miles in 30 minutes -recommend avoidance of tobacco products. Avoid excess alcohol. -Additional risk factor control:  -Diabetes: Type II diabetes without complications. A1c is 7.4. On sitagliptin-meformin. Tolerating well. Would consider SGLT2i in the future if additional agent needed.  -Lipids: Mixed hyperlipidemia. Elevated ASCVD risk score based on prior lipids. He believes that his PCP recently checked these and will get the information. Stopped rosuvastatin due to aches. He is  willing to trial again. My suggestion would  be, if he wants to be on a single prevention medication, statins have more benefit and less side effects long term than aspirin. I would stop aspirin at the end of 30 days. I recommend restarting the rosuvastatin today. If the side effects are not tolerable, we will discuss additional options in the future.   -Blood pressure control: if remains >130/80 would start an ACEI/ARB given his diabetes.  -Weight: BMI 32, class 1 obesity. Lifestyle discussed. -ASCVD risk score: The 10-year ASCVD risk score Mikey Bussing DC Brooke Bonito., et al., 2013) is: 22.6%   Values used to calculate the score:     Age: 71 years     Sex: Male     Is Non-Hispanic African American: Yes     Diabetic: Yes     Tobacco smoker: No     Systolic Blood Pressure: 151 mmHg     Is BP treated: No     HDL Cholesterol: 41 MG/DL     Total Cholesterol: 212 MG/DL   TIME SPENT WITH PATIENT: >60 minutes of direct patient care. More than 50% of that time was spent on coordination of care and counseling regarding symptoms and workup/results to date, mechanism of statins, prevention recommendations, risk factor counseling.  Plan for follow up: 2-3 months as previously scheduled  Medication Adjustments/Labs and Tests Ordered: Current medicines are reviewed at length with the patient today.  Concerns regarding medicines are outlined above.  Orders Placed This Encounter  Procedures  . Ambulatory referral to Neurology  . EKG 12-Lead   No orders of the defined types were placed in this encounter.   Patient Instructions  Medication Instructions:  Your Physician recommend you continue on your current medication as directed.    If you need a refill on your cardiac medications before your next appointment, please call your pharmacy.   Lab work: None   Testing/Procedures: None  Follow-Up: Your physician recommends that you keep scheduled follow up appointment.   Any Other Special Instructions Will  Be Listed Below (If Applicable).       Signed, Buford Dresser, MD PhD 07/17/2018 1:05 PM    [facility]

## 2018-07-17 NOTE — Addendum Note (Signed)
Addended by: Meryl Crutch on: 07/17/2018 03:45 PM   Modules accepted: Orders

## 2018-07-17 NOTE — Telephone Encounter (Signed)
Spoke with pt and informed that I will cancel referral to [facility] and send a new one to Dr. Amparo [place] office.

## 2018-07-20 NOTE — Telephone Encounter (Signed)
Informed pt that a referral has been sent to [facility] as well. Pt also injuring about monitor report. Advised that we have yet to receive it but will check on the status. Pt states he mailed it back on 11/18.

## 2018-07-24 MED FILL — JANUMET 50-1,000 MG TABLET: 50-1000 | 30 days supply | Qty: 60 | Fill #6

## 2018-07-29 ENCOUNTER — Ambulatory Visit: Payer: PPO | Admitting: Cardiology

## 2018-07-29 ENCOUNTER — Ambulatory Visit (INDEPENDENT_AMBULATORY_CARE_PROVIDER_SITE_OTHER): Payer: PPO | Admitting: Neurology

## 2018-07-29 ENCOUNTER — Encounter: Payer: Self-pay | Admitting: Neurology

## 2018-07-29 VITALS — BP 129/79 | HR 112 | Ht 70.0 in | Wt 224.0 lb

## 2018-07-29 DIAGNOSIS — R55 Syncope and collapse: Secondary | ICD-10-CM

## 2018-07-29 DIAGNOSIS — R7309 Other abnormal glucose: Secondary | ICD-10-CM | POA: Diagnosis not present

## 2018-07-29 DIAGNOSIS — R42 Dizziness and giddiness: Secondary | ICD-10-CM

## 2018-07-29 NOTE — Progress Notes (Signed)
[facility]    Provider:  Dr Jaynee Eagles Referring Provider: Kristopher Glee., MD Primary Care Physician:  Kristopher Glee., MD  CC:  Episodes of dizziness  HPI:  Nathan [name] is a 67 y.o. male here as requested by Dr. Karle Starch for syncope. PMHx diabetes, prostate cancer, HLD.  He feels better. He felt better about a week after discharge. After the flu shot he started having syncopal episodes. He is a Clinical biochemist at [employer]. Can be sitting or standing or even laying down. Everything goes blank. He feels like he is having fuzz in the head, feels like something is coming, few seconds, takes him aback, and it completely goes away, very brief. He kept having them repeatedly for 10 days. One episode he was sitting in his recliner felt very dizzy, like going to pass out - the next morning he got up and walked to the door and it hit him again and thought he was going to hit the floor. He never lost consciousness. Never had episodes before. No prior illnesses. Would last a few seconds. He went to the ED and he continued to be lightheaded. Episodes on movement. Would happen when reaching for remote for example.  Reviewed notes, labs and imaging from outside physicians, which showed:   Reviewed eeg report: nml  Ct showed No acute intracranial abnormalities including mass lesion or mass effect, hydrocephalus, extra-axial fluid collection, midline shift, hemorrhage, or acute infarction, large ischemic events (personally reviewed images)  TSH nml  Echo unremarkable  Review of Systems: Patient complains of symptoms per HPI as well as the following symptoms: dizziness. Pertinent negatives and positives per HPI. All others negative.   Social History   Socioeconomic History  . Marital status: Married    Spouse name: Not on file  . Number of children: Not on file  . Years of education: Not on file  . Highest education level: Not on file  Occupational History  . Occupation: chaplin    Social Needs  . Financial resource strain: Not on file  . Food insecurity:    Worry: Not on file    Inability: Not on file  . Transportation needs:    Medical: Not on file    Non-medical: Not on file  Tobacco Use  . Smoking status: Never Smoker  . Smokeless tobacco: Never Used  Substance and Sexual Activity  . Alcohol use: Not Currently    Alcohol/week: 0.0 standard drinks    Frequency: Never    Comment: 1970 quit  . Drug use: Never  . Sexual activity: Yes  Lifestyle  . Physical activity:    Days per week: Not on file    Minutes per session: Not on file  . Stress: Not on file  Relationships  . Social connections:    Talks on phone: Not on file    Gets together: Not on file    Attends religious service: Not on file    Active member of club or organization: Not on file    Attends meetings of clubs or organizations: Not on file    Relationship status: Not on file  . Intimate partner violence:    Fear of current or ex partner: Not on file    Emotionally abused: Not on file    Physically abused: Not on file    Forced sexual activity: Not on file  Other Topics Concern  . Not on file  Social History Narrative  . Not on file    Family  History  Problem Relation Age of Onset  . Cancer Mother     Past Medical History:  Diagnosis Date  . Diabetes mellitus without complication ([place])    type 2  . H/O cardiac catheterization   . Hyperlipidemia   . Malignant neoplasm of prostate ([place]) 02/05/2018    Past Surgical History:  Procedure Laterality Date  . colonscopy    . LYMPHADENECTOMY Bilateral 03/30/2018   Procedure: LYMPHADENECTOMY, PELVIC;  Surgeon: Raynelle Bring, MD;  Location: [facility];  Service: Urology;  Laterality: Bilateral;  . PROSTATE BIOPSY  01/2018  . ROBOT ASSISTED LAPAROSCOPIC RADICAL PROSTATECTOMY N/A 03/30/2018   Procedure: XI ROBOTIC ASSISTED LAPAROSCOPIC RADICAL PROSTATECTOMY LEVEL 3;  Surgeon: Raynelle Bring, MD;  Location: [facility];  Service: Urology;   Laterality: N/A;  ONLY NEEDS 210 MIN TOTAL FOR PROCEDURE  . vasedtomy  1986    Current Outpatient Medications  Medication Sig Dispense Refill  . rosuvastatin (CRESTOR) 20 MG tablet Take 20 mg by mouth at bedtime.     . sitaGLIPtin-metformin (JANUMET) 50-1000 MG tablet Take 1 tablet by mouth 2 (two) times daily with a meal.     No current facility-administered medications for this visit.     Allergies as of 07/29/2018  . (No Known Allergies)    Vitals: BP 129/79 (BP Location: Left Arm, Patient Position: Standing)   Pulse (!) 112   Ht 5\' 10"  (1.778 m)   Wt 224 lb (101.6 kg)   BMI 32.14 kg/m  Last Weight:  Wt Readings from Last 1 Encounters:  07/29/18 224 lb (101.6 kg)   Last Height:   Ht Readings from Last 1 Encounters:  07/29/18 5\' 10"  (1.778 m)   Physical exam: Exam: Gen: NAD, conversant, well nourised, well groomed                     CV: RRR, no MRG. No Carotid Bruits. No peripheral edema, warm, nontender Eyes: Conjunctivae clear without exudates or hemorrhage  Neuro: Detailed Neurologic Exam  Speech:    Speech is normal; fluent and spontaneous with normal comprehension.  Cognition:    The patient is oriented to person, place, and time;     recent and remote memory intact;     language fluent;     normal attention, concentration,     fund of knowledge Cranial Nerves:    The pupils are equal, round, and reactive to light. The fundi are normal and spontaneous venous pulsations are present. Visual fields are full to finger confrontation. Extraocular movements are intact. Trigeminal sensation is intact and the muscles of mastication are normal. The face is symmetric. The palate elevates in the midline. Hearing intact. Voice is normal. Shoulder shrug is normal. The tongue has normal motion without fasciculations.   Coordination:    Normal finger to nose and heel to shin. Normal rapid alternating movements.   Gait:    Heel-toe and tandem gait are normal.   Motor  Observation:    No asymmetry, no atrophy, and no involuntary movements noted. Tone:    Normal muscle tone.    Posture:    Posture is normal. normal erect    Strength:    Strength is V/V in the upper and lower limbs.      Sensation: intact to LT     Reflex Exam:  DTR's:    Deep tendon reflexes in the upper and lower extremities are normal bilaterally.   Toes:    The toes are downgoing bilaterally.  Clonus:    Clonus is absent.       Assessment/Plan:  67 y.o. male here as requested by Dr. Karle Starch for syncope. PMHx diabetes, prostate cancer, HLD.  Episodes sound vertiginous. Symptoms resolved. But his vitals have shown some tachycardia follow up with cardiology , his last HGBA1c was elevated will recheck, also possibility is vasovagal syncope. Could have been an immunogenic reaction to the flu shot, hasn't had an episode for 2 weeks will follow clinically.  Neuro exam is normal.   Orders Placed This Encounter  Procedures  . Hemoglobin A1c  . Basic Metabolic Panel   Cc: Kristopher Glee., MD  Sarina Ill, MD  [facility] [address] [place], [place] [address]  Phone [phone] Fax [phone]

## 2018-07-29 NOTE — Patient Instructions (Addendum)
Vertigo(inner ear) Vertigo is the feeling that you or your surroundings are moving when they are not. Vertigo can be dangerous if it occurs while you are doing something that could endanger you or others, such as driving. What are the causes? This condition is caused by a disturbance in the signals that are sent by your body's sensory systems to your brain. Different causes of a disturbance can lead to vertigo, including:  Infections, especially in the inner ear.  A bad reaction to a drug, or misuse of alcohol and medicines.  Withdrawal from drugs or alcohol.  Quickly changing positions, as when lying down or rolling over in bed.  Migraine headaches.  Decreased blood flow to the brain.  Decreased blood pressure.  Increased pressure in the brain from a head or neck injury, stroke, infection, tumor, or bleeding.  Central nervous system disorders.  What are the signs or symptoms? Symptoms of this condition usually occur when you move your head or your eyes in different directions. Symptoms may start suddenly, and they usually last for less than a minute. Symptoms may include:  Loss of balance and falling.  Feeling like you are spinning or moving or lightheaded.  Feeling like your surroundings are spinning or moving.  Nausea and vomiting.  Blurred vision or double vision.  Difficulty hearing.  Slurred speech.  Dizziness.  Involuntary eye movement (nystagmus).  Symptoms can be mild and cause only slight annoyance, or they can be severe and interfere with daily life. Episodes of vertigo may return (recur) over time, and they are often triggered by certain movements. Symptoms may improve over time. How is this diagnosed? This condition may be diagnosed based on medical history and the quality of your nystagmus. Your health care provider may test your eye movements by asking you to quickly change positions to trigger the nystagmus. This may be called the Dix-Hallpike test, head  thrust test, or roll test. You may be referred to a health care provider who specializes in ear, nose, and throat (ENT) problems (otolaryngologist) or a provider who specializes in disorders of the central nervous system (neurologist). You may have additional testing, including:  A physical exam.  Blood tests.  MRI.  A CT scan.  An electrocardiogram (ECG). This records electrical activity in your heart.  An electroencephalogram (EEG). This records electrical activity in your brain.  Hearing tests.  How is this treated? Treatment for this condition depends on the cause and the severity of the symptoms. Treatment options include:  Medicines to treat nausea or vertigo. These are usually used for severe cases. Some medicines that are used to treat other conditions may also reduce or eliminate vertigo symptoms. These include: ? Medicines that control allergies (antihistamines). ? Medicines that control seizures (anticonvulsants). ? Medicines that relieve depression (antidepressants). ? Medicines that relieve anxiety (sedatives).  Head movements to adjust your inner ear back to normal. If your vertigo is caused by an ear problem, your health care provider may recommend certain movements to correct the problem.  Surgery. This is rare.  Follow these instructions at home: Safety  Move slowly.Avoid sudden body or head movements.  Avoid driving.  Avoid operating heavy machinery.  Avoid doing any tasks that would cause danger to you or others if you would have a vertigo episode during the task.  If you have trouble walking or keeping your balance, try using a cane for stability. If you feel dizzy or unstable, sit down right away.  Return to your normal activities as  told by your health care provider. Ask your health care provider what activities are safe for you. General instructions  Take over-the-counter and prescription medicines only as told by your health care  provider.  Avoid certain positions or movements as told by your health care provider.  Drink enough fluid to keep your urine clear or pale yellow.  Keep all follow-up visits as told by your health care provider. This is important. Contact a health care provider if:  Your medicines do not relieve your vertigo or they make it worse.  You have a fever.  Your condition gets worse or you develop new symptoms.  Your family or friends notice any behavioral changes.  Your nausea or vomiting gets worse.  You have numbness or a "pins and needles" sensation in part of your body. Get help right away if:  You have difficulty moving or speaking.  You are always dizzy.  You faint.  You develop severe headaches.  You have weakness in your hands, arms, or legs.  You have changes in your hearing or vision.  You develop a stiff neck.  You develop sensitivity to light. This information is not intended to replace advice given to you by your health care provider. Make sure you discuss any questions you have with your health care provider. Document Released: 05/22/2005 Document Revised: 01/24/2016 Document Reviewed: 12/05/2014 Elsevier Interactive Patient Education  2018 [employer].   Vasovagal Syncope, Adult Syncope, which is commonly known as fainting or passing out, is a temporary loss of consciousness. It occurs when the blood flow to the brain is reduced. Vasovagal syncope, also called neurocardiogenic syncope, is a fainting spell that happens when blood flow to the brain is reduced because of a sudden drop in heart rate and blood pressure. Vasovagal syncope is usually harmless. However, you can get injured if you fall during a fainting spell. What are the causes? This condition is caused by a drop in heart rate and blood pressure, usually in response to a trigger. Many things and situations can trigger an episode, including:  Pain.  Fear.  The sight of blood. This may occur  during medical procedures, such as when blood is being drawn from a vein.  Common activities, such as coughing, swallowing, stretching, or going to the bathroom.  Emotional stress.  Being in a confined space.  Prolonged standing, especially in a warm environment.  Lack of sleep or rest.  Not eating for a long time.  Not drinking enough liquids.  Recent illness.  Drinking alcohol.  Taking drugs that affect blood pressure, such as marijuana, cocaine, opiates, or inhalants.  What are the signs or symptoms? Before a fainting episode, you may:  Feel dizzy or light-headed.  Become pale.  Sense that you are going to faint.  Feel like the room is spinning.  Only see directly ahead (tunnel vision).  Feel sick to your stomach (nauseous).  See spots.  Slowly lose vision.  Hear ringing in your ears.  Have a headache.  Feel warm and sweaty.  Feel a sensation of pins and needles.  During the fainting spell, you may twitch or make jerky movements. Fainting spells usually last no longer than a few minutes before you wake up. If you get up too quickly before your body can recover, you may faint again. How is this diagnosed? This condition is diagnosed based on your symptoms, your medical history, and a physical exam. Tests may be done to rule out other causes of fainting. Tests may  include:  Blood tests.  Heart tests, such as an electrocardiogram (ECG), echocardiogram, or electrophysiology study.  A test to check your response to changes in position (tilt table test).  How is this treated? Usually, treatment is not needed for this condition. Your health care provider may suggest ways to help prevent fainting episodes. These may include:  Drinking additional fluids if you are exposed to a trigger.  Sitting or lying down if you notice signs that an episode is coming.  If your fainting spells continue, your health care provider may recommend that you:  Take medicines  to prevent fainting or to help reduce further episodes of fainting.  Do certain exercises.  Wear compression stockings.  Have surgery to place a pacemaker in your body (rare).  Follow these instructions at home:  Learn to identify the signs that an episode is coming.  Sit or lie down at the first sign of a fainting spell. If you sit down, put your head down between your legs. If you lie down, swing your legs up in the air to increase blood flow to the brain.  Avoid hot tubs and saunas.  Avoid standing for a long time. If you have to stand for a long time, try: ? Crossing your legs. ? Flexing and stretching your leg muscles. ? Squatting. ? Moving your legs. ? Bending over.  Drink enough fluid to keep your urine clear or pale yellow.  Make changes to your diet that your health care provider recommends. You may be told to: ? Avoid caffeine. ? Eat more salt.  Take over-the-counter and prescription medicines only as told by your health care provider. Contact a health care provider if:  You continue to have fainting spells despite treatment.  You faint more often despite treatment.  You lose consciousness for more than a few minutes.  You faint during or after exercising or after being startled.  You have twitching or jerky movements for longer than a few seconds during a fainting spell.  You have an episode of twitching or jerky movements without fainting. Get help right away if:  A fainting spell leads to an injury or bleeding.  You have new symptoms that occur with the fainting spells, such as: ? Shortness of breath. ? Chest pain. ? Irregular heartbeat.  You twitch or make jerky movements for more than 5 minutes.  You twitch or make jerky movements during more than one fainting spell. This information is not intended to replace advice given to you by your health care provider. Make sure you discuss any questions you have with your health care provider. Document  Released: 07/29/2012 Document Revised: 01/24/2016 Document Reviewed: 06/10/2015 Elsevier Interactive Patient Education  [employer].

## 2018-07-30 LAB — HEMOGLOBIN A1C
Est. average glucose Bld gHb Est-mCnc: 163 mg/dL
Hgb A1c MFr Bld: 7.3 % — ABNORMAL HIGH (ref 4.8–5.6)

## 2018-07-30 LAB — BASIC METABOLIC PANEL
BUN/Creatinine Ratio: 14 (ref 10–24)
BUN: 16 mg/dL (ref 8–27)
CALCIUM: 9.9 mg/dL (ref 8.6–10.2)
CO2: 22 mmol/L (ref 20–29)
CREATININE: 1.12 mg/dL (ref 0.76–1.27)
Chloride: 100 mmol/L (ref 96–106)
GFR calc Af Amer: 79 mL/min/{1.73_m2} (ref >59)
GFR, EST NON AFRICAN AMERICAN: 68 mL/min/{1.73_m2} (ref >59)
Glucose: 173 mg/dL — ABNORMAL HIGH (ref 65–99)
POTASSIUM: 4.6 mmol/L (ref 3.5–5.2)
Sodium: 138 mmol/L (ref 134–144)

## 2018-08-03 ENCOUNTER — Telehealth: Payer: Self-pay | Admitting: Neurology

## 2018-08-03 NOTE — Telephone Encounter (Signed)
Spoke with Dr. Jaynee Eagles. Will advise pt to follow-up with cardiology for the tachycardia and we can do an EEG at his home for 72 hours.

## 2018-08-03 NOTE — Telephone Encounter (Signed)
Patient states he is still having periods of syncope. He has had 9-10 episodes since his last visit. He says his head seems weighted.

## 2018-08-03 NOTE — Telephone Encounter (Addendum)
Spoke with pt. He stated that he has not seen cardiology yet. He will call and schedule appt. He has not had an episode since 9:30 AM yesterday. Discussed that we can order a 72 hour home EEG. Pt agreed to have it ordered and he will f/u in cardiology in the meantime. He may decide he doesn't want the EEG and RN advised that he can do that if he chooses. Pt thinks these episodes may be anxiety related and he is hoping they will just go away but he will f/u with cardiology. He will call back again next Monday with an update. Pt aware that we will place the order for the EEG and they will process through insurance and call him to schedule. Pt appreciative. He is aware that office note was sent to PCP by Dr. Jaynee Eagles and that cardiology can see the notes here. Pt advised that for any acute changes, worsening or concerning symptoms to call 911/proceed to an ED right away. Pt verbalized understanding.

## 2018-08-04 ENCOUNTER — Telehealth: Payer: Self-pay | Admitting: *Deleted

## 2018-08-04 NOTE — Telephone Encounter (Signed)
Referral to [facility] for 72 hour home EEG completed and ready for MD signature. Copies of insurance card, office note, and routine EEG printed.

## 2018-08-04 NOTE — Telephone Encounter (Signed)
EEG referral signed by MD and faxed to [facility] along with insurance card copy, office note,and routine EEG. Received a receipt of confirmation.

## 2018-08-12 ENCOUNTER — Telehealth: Payer: Self-pay | Admitting: Cardiology

## 2018-08-12 DIAGNOSIS — N393 Stress incontinence (female) (male): Secondary | ICD-10-CM | POA: Diagnosis not present

## 2018-08-12 DIAGNOSIS — M6281 Muscle weakness (generalized): Secondary | ICD-10-CM | POA: Diagnosis not present

## 2018-08-12 DIAGNOSIS — M62838 Other muscle spasm: Secondary | ICD-10-CM | POA: Diagnosis not present

## 2018-08-12 DIAGNOSIS — D72819 Decreased white blood cell count, unspecified: Secondary | ICD-10-CM | POA: Diagnosis not present

## 2018-08-12 NOTE — Telephone Encounter (Signed)
New message    STAT if patient feels like he/she is going to faint   1) Are you dizzy now? Yes   2) Do you feel faint or have you passed out? Feels faint and feels like his head is heavy   3) Do you have any other symptoms? Head is pounding and heavy , this is happening 6 to 7 times a day  4) Have you checked your HR and BP (record if available)? 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

## 2018-08-12 NOTE — Telephone Encounter (Signed)
Spoke with patient and he c/o ongoing dizziness to continues to be bothersome. Currently at work. Reviewed last result note in chart with patient where Dr. Harrell Gave suggest he follow up with his PCP and neurologist. Patient said that he was told by the neurologist to contact his cardiologist again for an appointment. Appointment has been scheduled for patient to see [place] on 08/13/18. Patient request that this encounter be sent to his PCP. Advised patient that if his symptoms get worse between now and his appointment on tomorrow that he needed to go to the ED for an evaluation.Verbalized understanding of plan.

## 2018-08-13 ENCOUNTER — Encounter: Payer: Self-pay | Admitting: Cardiology

## 2018-08-13 ENCOUNTER — Ambulatory Visit (INDEPENDENT_AMBULATORY_CARE_PROVIDER_SITE_OTHER): Payer: PPO | Admitting: Cardiology

## 2018-08-13 VITALS — BP 128/82 | HR 108 | Ht 70.0 in | Wt 222.0 lb

## 2018-08-13 DIAGNOSIS — R42 Dizziness and giddiness: Secondary | ICD-10-CM

## 2018-08-13 DIAGNOSIS — R002 Palpitations: Secondary | ICD-10-CM | POA: Diagnosis not present

## 2018-08-13 DIAGNOSIS — E119 Type 2 diabetes mellitus without complications: Secondary | ICD-10-CM | POA: Diagnosis not present

## 2018-08-13 DIAGNOSIS — C61 Malignant neoplasm of prostate: Secondary | ICD-10-CM

## 2018-08-13 NOTE — Assessment & Plan Note (Signed)
Unclear etiology- negative neurologic work up, negative cardiac work up

## 2018-08-13 NOTE — Assessment & Plan Note (Signed)
Surgery Aug 2019

## 2018-08-13 NOTE — Assessment & Plan Note (Signed)
Recent Hgb A1c- 7.3

## 2018-08-13 NOTE — Patient Instructions (Addendum)
Medication Instructions:  Your physician recommends that you continue on your current medications as directed. Please refer to the Current Medication list given to you today.  If you need a refill on your cardiac medications before your next appointment, please call your pharmacy.   Lab work: None  Testing/Procedures: Your physician has requested that you have an exercise tolerance test. For further information please visit [email or web address]. Please also follow instruction sheet, as given.      Follow-Up: Lurena Joiner recommends that you follow up with Dr. Harrell Gave as scheduled.    Any Other Special Instructions Will Be Listed Below (If Applicable).

## 2018-08-13 NOTE — Progress Notes (Signed)
08/13/2018 Nathan [name]   10-21-1950  [id number]  Primary Physician Kristopher Glee., MD Primary Cardiologist: Dr Harrell Gave  HPI: Nathan [name] is a pleasant 67 year old male with a history of NIDDM, prostate CA, and HLD.  He is a Clinical biochemist at [employer].  In October 2019 he received his flu shot.  Ever since then he has had issues with dizziness.  The patient describes periodic feelings of almost passing out.  This may last for a few seconds.  He has had a complete neurologic work-up including an MRI, EEG, CTA of his carotids, and a complete cardiac work-up including a 30-day event monitor and echo. He says he had "spells" while he was wearing his monitor but no arrythmia was documented.  He denies any sinus issues.Marland Kitchen He was in the ED and had an "episode" and spoke with a neurologist that was in the ED who told him he thought it was cardiac.  The pt now returns to see me.    Current Outpatient Medications  Medication Sig Dispense Refill  . rosuvastatin (CRESTOR) 20 MG tablet Take 20 mg by mouth at bedtime.     . sitaGLIPtin-metformin (JANUMET) 50-1000 MG tablet Take 1 tablet by mouth 2 (two) times daily with a meal.     No current facility-administered medications for this visit.     No Known Allergies  Past Medical History:  Diagnosis Date  . Diabetes mellitus without complication ([place])    type 2  . H/O cardiac catheterization   . Hyperlipidemia   . Malignant neoplasm of prostate ([place]) 02/05/2018    Social History   Socioeconomic History  . Marital status: Married    Spouse name: Not on file  . Number of children: Not on file  . Years of education: Not on file  . Highest education level: Not on file  Occupational History  . Occupation: chaplin  Social Needs  . Financial resource strain: Not on file  . Food insecurity:    Worry: Not on file    Inability: Not on file  . Transportation needs:    Medical: Not on file    Non-medical: Not on file  Tobacco Use  .  Smoking status: Never Smoker  . Smokeless tobacco: Never Used  Substance and Sexual Activity  . Alcohol use: Not Currently    Alcohol/week: 0.0 standard drinks    Frequency: Never    Comment: 1970 quit  . Drug use: Never  . Sexual activity: Yes  Lifestyle  . Physical activity:    Days per week: Not on file    Minutes per session: Not on file  . Stress: Not on file  Relationships  . Social connections:    Talks on phone: Not on file    Gets together: Not on file    Attends religious service: Not on file    Active member of club or organization: Not on file    Attends meetings of clubs or organizations: Not on file    Relationship status: Not on file  . Intimate partner violence:    Fear of current or ex partner: Not on file    Emotionally abused: Not on file    Physically abused: Not on file    Forced sexual activity: Not on file  Other Topics Concern  . Not on file  Social History Narrative  . Not on file     Family History  Problem Relation Age of Onset  . Cancer Mother  Review of Systems: General: negative for chills, fever, night sweats or weight changes.  Cardiovascular: negative for chest pain, dyspnea on exertion, edema, orthopnea, palpitations, paroxysmal nocturnal dyspnea or shortness of breath Dermatological: negative for rash Respiratory: negative for cough or wheezing Urologic: negative for hematuria Abdominal: negative for nausea, vomiting, diarrhea, bright red blood per rectum, melena, or hematemesis Neurologic: negative for visual changes, syncope, or dizziness All other systems reviewed and are otherwise negative except as noted above.    Blood pressure 128/82, pulse (!) 108, height 5\' 10"  (1.778 m), weight 222 lb (100.7 kg), SpO2 96 %.  General appearance: alert, cooperative and no distress Neck: no carotid bruit and no JVD Lungs: clear to auscultation bilaterally Heart: regular rate and rhythm Extremities: no edema Skin: Skin color,  texture, turgor normal. No rashes or lesions Neurologic: Grossly normal  EKG NSR., ST 108  ASSESSMENT AND PLAN:   Dizziness Unclear etiology- negative neurologic work up, negative cardiac work up  Non-insulin dependent type 2 diabetes mellitus (HCC) Recent Hgb A1c- 7.3  Malignant neoplasm of prostate [facility]) Surgery Aug 2019   PLAN  Will review with MD. I considered a chest CTA with his history of prostate cancer and resting tachycardia (normal TSH) but he denies SOB or chest pain and his symptoms last only 30 seconds or less.   Recent labs drawn by his PCP were normal according to the patient.   After review with Dr Percival Spanish we'll arrange for a GXT and f/u with Dr Harrell Gave.    Kerin Ransom PA-C 08/13/2018 1:58 PM

## 2018-08-25 DIAGNOSIS — C61 Malignant neoplasm of prostate: Secondary | ICD-10-CM | POA: Diagnosis not present

## 2018-08-28 ENCOUNTER — Telehealth (HOSPITAL_COMMUNITY): Payer: Self-pay

## 2018-08-28 NOTE — Telephone Encounter (Signed)
Encounter complete. 

## 2018-09-01 DIAGNOSIS — N5201 Erectile dysfunction due to arterial insufficiency: Secondary | ICD-10-CM | POA: Diagnosis not present

## 2018-09-01 DIAGNOSIS — N393 Stress incontinence (female) (male): Secondary | ICD-10-CM | POA: Diagnosis not present

## 2018-09-01 DIAGNOSIS — C61 Malignant neoplasm of prostate: Secondary | ICD-10-CM | POA: Diagnosis not present

## 2018-09-02 ENCOUNTER — Ambulatory Visit (HOSPITAL_COMMUNITY)
Admission: RE | Admit: 2018-09-02 | Discharge: 2018-09-02 | Disposition: A | Discharge status: Home / Self Care | Payer: PPO | Source: Ambulatory Visit | Attending: Cardiology | Admitting: Cardiology

## 2018-09-02 DIAGNOSIS — R002 Palpitations: Secondary | ICD-10-CM | POA: Insufficient documentation

## 2018-09-02 DIAGNOSIS — R42 Dizziness and giddiness: Secondary | ICD-10-CM

## 2018-09-02 DIAGNOSIS — E119 Type 2 diabetes mellitus without complications: Secondary | ICD-10-CM

## 2018-09-02 DIAGNOSIS — C61 Malignant neoplasm of prostate: Secondary | ICD-10-CM | POA: Diagnosis not present

## 2018-09-02 LAB — EXERCISE TOLERANCE TEST
Estimated workload: 9 METS
Exercise duration (min): 7 min
Exercise duration (sec): 21 s
MPHR: 154 {beats}/min
Peak HR: 164 {beats}/min
Percent HR: 106 %
RPE: 19
Rest HR: 94 {beats}/min

## 2018-09-07 ENCOUNTER — Telehealth: Payer: Self-pay | Admitting: Neurology

## 2018-09-07 DIAGNOSIS — G40A09 Absence epileptic syndrome, not intractable, without status epilepticus: Secondary | ICD-10-CM | POA: Diagnosis not present

## 2018-09-07 NOTE — Telephone Encounter (Signed)
Pt states he just saw his ENT doctor and they recommended he come see his Neuro physician. ENT will be forwarding office notes. Pt would like to be called because he states that he is still having his episodes.

## 2018-09-08 MED FILL — JANUMET 50-1,000 MG TABLET: 50-1000 | 30 days supply | Qty: 60 | Fill #7

## 2018-09-08 NOTE — Telephone Encounter (Signed)
Returned call to patient.  Reports his ENT evaluation did not uncover a cause for his symptoms.  He would like to move forward with his ordered 72-hr ambulatory EEG.  Says [facility] had reached out to him to schedule but he denied at that time.  He wanted to wait until after he saw ENT.  He was provided the number to [facility] [phone]).  He will call them to make an appointment.  He was instructed to call us back if he has any problems getting the test scheduled.

## 2018-09-09 DIAGNOSIS — N393 Stress incontinence (female) (male): Secondary | ICD-10-CM | POA: Diagnosis not present

## 2018-09-09 DIAGNOSIS — M62838 Other muscle spasm: Secondary | ICD-10-CM | POA: Diagnosis not present

## 2018-09-09 DIAGNOSIS — M6281 Muscle weakness (generalized): Secondary | ICD-10-CM | POA: Diagnosis not present

## 2018-09-09 MED FILL — SILDENAFIL CITRATE 100 MG T: 100 | 10 days supply | Qty: 10 | Fill #0

## 2018-09-21 DIAGNOSIS — Z0000 Encounter for general adult medical examination without abnormal findings: Secondary | ICD-10-CM | POA: Diagnosis not present

## 2018-09-24 ENCOUNTER — Ambulatory Visit: Payer: PPO | Admitting: Neurology

## 2018-09-25 ENCOUNTER — Encounter: Payer: Self-pay | Admitting: Neurology

## 2018-09-25 DIAGNOSIS — R569 Unspecified convulsions: Secondary | ICD-10-CM | POA: Diagnosis not present

## 2018-09-26 DIAGNOSIS — R569 Unspecified convulsions: Secondary | ICD-10-CM | POA: Diagnosis not present

## 2018-09-27 DIAGNOSIS — R569 Unspecified convulsions: Secondary | ICD-10-CM | POA: Diagnosis not present

## 2018-10-02 ENCOUNTER — Telehealth: Payer: Self-pay | Admitting: Neurology

## 2018-10-02 NOTE — Telephone Encounter (Signed)
I don't see where Dr Jaynee Eagles has reviewed the results from the patient's 3 day EEG testing. I will send to Dr Jaynee Eagles to let her know the patient is calling and we will call the patient with results once Dr Jaynee Eagles has received/reviewed them. At that time she will indicate the need for a follow up apt.

## 2018-10-02 NOTE — Telephone Encounter (Signed)
Pt is asking for a call to know how long does it take for the results of his 3 day Neurologic Diagnostic test.  Pt also asking if he will need to come in for results or if he can just be called with the results.  Please call

## 2018-10-05 ENCOUNTER — Encounter: Payer: Self-pay | Admitting: Neurology

## 2018-10-05 NOTE — Telephone Encounter (Signed)
Can you look into it? thanks

## 2018-10-05 NOTE — Telephone Encounter (Signed)
Called [facility] medical records dept @ [phone] and LVM for Tammy asking for result status. Left office number and hours in message.

## 2018-10-06 NOTE — Telephone Encounter (Signed)
Chrys Racer called back. They have sent the EEG to the reading doctor and will ask for results to be expedited.

## 2018-10-06 NOTE — Telephone Encounter (Signed)
Called [facility] again and spoke with [place]. She stated the patient had his EEG done on the 31st and she will call back with the status on results.

## 2018-10-06 NOTE — Telephone Encounter (Addendum)
I called the pt on [phone] and LVM (ok per DPR) informing him that [facility] will be expediting the results and we will call when they are available to [place].

## 2018-10-07 DIAGNOSIS — M62838 Other muscle spasm: Secondary | ICD-10-CM | POA: Diagnosis not present

## 2018-10-07 DIAGNOSIS — M6281 Muscle weakness (generalized): Secondary | ICD-10-CM | POA: Diagnosis not present

## 2018-10-07 DIAGNOSIS — N393 Stress incontinence (female) (male): Secondary | ICD-10-CM | POA: Diagnosis not present

## 2018-10-12 ENCOUNTER — Ambulatory Visit (INDEPENDENT_AMBULATORY_CARE_PROVIDER_SITE_OTHER): Payer: PPO | Admitting: Cardiology

## 2018-10-12 ENCOUNTER — Encounter: Payer: Self-pay | Admitting: Cardiology

## 2018-10-12 VITALS — BP 110/66 | HR 103 | Ht 70.0 in | Wt 218.0 lb

## 2018-10-12 DIAGNOSIS — Z712 Person consulting for explanation of examination or test findings: Secondary | ICD-10-CM | POA: Diagnosis not present

## 2018-10-12 DIAGNOSIS — R42 Dizziness and giddiness: Secondary | ICD-10-CM

## 2018-10-12 DIAGNOSIS — R079 Chest pain, unspecified: Secondary | ICD-10-CM | POA: Diagnosis not present

## 2018-10-12 MED FILL — JANUMET 50-1,000 MG TABLET: 50-1000 | 30 days supply | Qty: 60 | Fill #0

## 2018-10-12 NOTE — Patient Instructions (Addendum)
Medication Instructions:  Your Physician recommend you continue on your current medication as directed.    If you need a refill on your cardiac medications before your next appointment, please call your pharmacy.   Lab work: None  Testing/Procedures: None  Follow-Up: At [facility], you and your health needs are our priority.  As part of our continuing mission to provide you with exceptional heart care, we have created designated Provider Care Teams.  These Care Teams include your primary Cardiologist (physician) and Advanced Practice Providers (APPs -  Physician Assistants and Nurse Practitioners) who all work together to provide you with the care you need, when you need it. You will need a follow up appointment in 4 months.  Please call our office 2 months in advance to schedule this appointment.  You may see Alyssabeth Bruster, MD or one of the following Advanced Practice Providers on your designated Care Team:   Rhonda Barrett, PA-C . Kathryn Lawrence, DNP, ANP    

## 2018-10-12 NOTE — Progress Notes (Signed)
Cardiology Office Note:    Date:  10/12/2018   ID:  Nathan [name], DOB Jan 21, 1951, MRN [id number]  PCP:  Kristopher Glee., MD  Cardiologist:  Buford Dresser, MD PhD  Referring MD: Kristopher Glee., MD   CC: follow up of lightheadedness  History of Present Illness:    Nathan [name] is a 68 y.o. male with a hx of hypertension, hyperlipidemia, type II diabetes, prostate cancer s/p resection who is seen in follow up for the evaluation and management of dizziness. I initially saw him in the office on 07/07/18.  Prior history: He was discharged from the hospital on 06/12/18 after ~2 weeks of dizziness symptoms without LOC but several seconds of "going blank." Neuro workup unremarkable, including CTA head and neck and EEG. Echo also unremarkable. Was not orthostatic in the hospital, but appeared to have intermittent sinus tachycardia. Initial onset: early October (about 10 days after the flu shot). Frequency: intially every other day, then a few times/day around the time of hospitalization, now about twice/day (though now he feels like it could come on any time). Lasts several seconds, no clear triggers or aggravating/alleviating factors.  Today: He has had echo, ETT, monitor workup from a cardiac standpoint. He has also been seen by ENT and neurology, and he is awaiting results on his ambulatory EEG monitor. He continues to have frequent, sometimes several times/day, episodes that last seconds. Feels out of it, lightheaded, tightness in chest, sometimes like he can't speak. He had several of these episodes while wearing a monitor without any arrhythmia. He is understandably distressed by this. 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 Feels like he is always "on"  And can never relax.  Past Medical History:  Diagnosis Date  . Diabetes mellitus without complication  ([place])    type 2  . H/O cardiac catheterization   . Hyperlipidemia   . Malignant neoplasm of prostate ([place]) 02/05/2018    Past Surgical History:  Procedure Laterality Date  . colonscopy    . LYMPHADENECTOMY Bilateral 03/30/2018   Procedure: LYMPHADENECTOMY, PELVIC;  Surgeon: Raynelle Bring, MD;  Location: [facility];  Service: Urology;  Laterality: Bilateral;  . PROSTATE BIOPSY  01/2018  . ROBOT ASSISTED LAPAROSCOPIC RADICAL PROSTATECTOMY N/A 03/30/2018   Procedure: XI ROBOTIC ASSISTED LAPAROSCOPIC RADICAL PROSTATECTOMY LEVEL 3;  Surgeon: Raynelle Bring, MD;  Location: [facility];  Service: Urology;  Laterality: N/A;  ONLY NEEDS 210 MIN TOTAL FOR PROCEDURE  . vasedtomy  1986    Current Medications: Current Outpatient Medications on File Prior to Visit  Medication Sig  . rosuvastatin (CRESTOR) 20 MG tablet Take 20 mg by mouth at bedtime.   . sitaGLIPtin-metformin (JANUMET) 50-1000 MG tablet Take 1 tablet by mouth 2 (two) times daily with a meal.   No current facility-administered medications on file prior to visit.      Allergies:   Patient has no known allergies.   Social History   Socioeconomic History  . Marital status: Married    Spouse name: Not on file  . Number of children: Not on file  . Years of education: Not on file  . Highest education level: Not on file  Occupational History  . Occupation: chaplin  Social Needs  . Financial resource strain: Not on file  . Food insecurity:    Worry: Not on file    Inability: Not on  file  . Transportation needs:    Medical: Not on file    Non-medical: Not on file  Tobacco Use  . Smoking status: Never Smoker  . Smokeless tobacco: Never Used  Substance and Sexual Activity  . Alcohol use: Not Currently    Alcohol/week: 0.0 standard drinks    Frequency: Never    Comment: 1970 quit  . Drug use: Never  . Sexual activity: Yes  Lifestyle  . Physical activity:    Days per week: Not on file    Minutes per session: Not on file  . Stress:  Not on file  Relationships  . Social connections:    Talks on phone: Not on file    Gets together: Not on file    Attends religious service: Not on file    Active member of club or organization: Not on file    Attends meetings of clubs or organizations: Not on file    Relationship status: Not on file  Other Topics Concern  . Not on file  Social History Narrative  . Not on file     Family History: The patient's family history includes Cancer in his mother.  ROS:   Please see the history of present illness.  Additional pertinent ROS:  Constitutional: Negative for chills, fever, night sweats, unintentional weight loss  HENT: Negative for ear pain and hearing loss.   Eyes: Negative for loss of vision and eye pain.  Respiratory: Negative for cough, sputum, shortness of breath, wheezing.   Cardiovascular: Negative for chest pain, palpitations, PND, orthopnea, lower extremity edema and claudication.  Gastrointestinal: Negative for abdominal pain, melena, and hematochezia.  Genitourinary: Negative for dysuria and hematuria.  Musculoskeletal: Negative for falls and myalgias.  Skin: Negative for itching and rash.  Neurological: Negative for focal weakness, focal sensory changes and loss of consciousness. Positive for lightheadedness/dizziness Endo/Heme/Allergies: Does not bruise/bleed easily.    EKGs/Labs/Other Studies Reviewed:    The following studies were reviewed today: ETT 2018/09/19 The test was stopped because the patient complained of fatigue.   Blood pressure and heart rate demonstrated a normal response to exercise. Blood pressure demonstrated a normal response to exercise. Overall, the patient's exercise capacity was normal.   85% of maximum heart rate was achieved after 1 minutes. Recovery time: 7 minutes. The patient's response to exercise was adequate for diagnosis.  Response to Stress There was no ST segment deviation noted during stress.  Arrhythmias during stress: none.   Arrhythmias during recovery: none.  There were no significant arrhythmias noted during the test.  ECG was interpretable and conclusive.  Stress Measurements   Baseline Vitals  Rest HR 94 bpm    Rest BP 125/83 mmHg    Exercise Time  Exercise duration (min) 7 min    Exercise duration (sec) 21 sec    Peak Stress Vitals  Peak HR 164 bpm    Peak BP 176/132 mmHg    Exercise Data  MPHR 154 bpm    Percent HR 106 %    RPE 19     Estimated workload 9 METS         Echo 06/11/18 Study Conclusions  - Left ventricle: The cavity size was normal. Wall thickness was   increased in a pattern of mild LVH. The estimated ejection   fraction was 55%. Wall motion was normal; there were no regional   wall motion abnormalities. Doppler parameters are consistent with   abnormal left ventricular relaxation (grade 1 diastolic   dysfunction). -  Aortic valve: There was no stenosis. - Mitral valve: There was no significant regurgitation. - Right ventricle: The cavity size was mildly dilated. Systolic   function was normal. - Pulmonary arteries: No complete TR doppler jet so unable to   estimate PA systolic pressure. - Inferior vena cava: The vessel was normal in size. The   respirophasic diameter changes were in the normal range (>= 50%),   consistent with normal central venous pressure.  Impressions: - Normal LV size with mild LV hypertrophy. EF 55%. Mildly dilated   RV with normal systolic function. No significant valvular   abnormalities.  Monitor 07/2018: Nearly 17 days of data on [employer] monitor. Heart rate range 67-152 bpm, average 106 bpm. All were sinus rhythm/sinus tachycardia.  No atrial fibrillation, SVT, VT, high degree heart block, or pauses noted. PAC burden <1%, PVC burden <1%. 10 patient triggered events, all sinus tachycardia. HR range 106-131 bpm during events. Symptoms were dizzyness/lightheadedness. No significant abnormalities noted.  EKG:  EKG is personally  reviewed.  The ekg today shows sinus rhythm at 100 bpm.  Recent Labs: 06/10/2018: TSH 0.656 06/11/2018: Hemoglobin 13.1; Platelets 253 07/29/2018: BUN 16; Creatinine, Ser 1.12; Potassium 4.6; Sodium 138  Recent Lipid Panel    Component Value Date/Time   CHOL  10/04/2008 1045    176        ATP III CLASSIFICATION:  <200     mg/dL   Desirable  200-239  mg/dL   Borderline High  >=240    mg/dL   High          TRIG 176 (H) 10/04/2008 1045   HDL 22 (L) 10/04/2008 1045   CHOLHDL 8.0 10/04/2008 1045   VLDL 35 10/04/2008 1045   LDLCALC (H) 10/04/2008 1045    119        Total Cholesterol/HDL:CHD Risk Coronary Heart Disease Risk Table                     Men   Women  1/2 Average Risk   3.4   3.3  Average Risk       5.0   4.4  2 X Average Risk   9.6   7.1  3 X Average Risk  23.4   11.0        Use the calculated Patient Ratio above and the CHD Risk Table to determine the patient's CHD Risk.        ATP III CLASSIFICATION (LDL):  <100     mg/dL   Optimal  100-129  mg/dL   Near or Above                    Optimal  130-159  mg/dL   Borderline  160-189  mg/dL   High  >190     mg/dL   Very High    Physical Exam:    VS:  BP 110/66   Pulse (!) 103   Ht 5\' 10"  (1.778 m)   Wt 218 lb (98.9 kg)   BMI 31.28 kg/m   No data found. Wt Readings from Last 3 Encounters:  10/12/18 218 lb (98.9 kg)  08/13/18 222 lb (100.7 kg)  07/29/18 224 lb (101.6 kg)     GEN: Well nourished, well developed in no acute distress HEENT: Normal NECK: No JVD; No carotid bruits LYMPHATICS: No lymphadenopathy CARDIAC: regular rhythm, normal S1 and S2, no murmurs, rubs, gallops. Radial and DP pulses 2+ bilaterally. RESPIRATORY:  Clear to auscultation without  rales, wheezing or rhonchi  ABDOMEN: Soft, non-tender, non-distended MUSCULOSKELETAL:  No edema; No deformity  SKIN: Warm and dry NEUROLOGIC:  Alert and oriented x 3 PSYCHIATRIC:  Normal affect   ASSESSMENT:    1. Chest pain, unspecified type   2.  Dizziness   3. Encounter to discuss test results    PLAN:    Lightheadedness/dizziness, with chest tightness:  We discussed his cardiac workup at length today. He has had all 4 anatomic cardiac areas evaluated: no arrhythmias on cardiac monitor (even during his symptomatic events), no systolic dysfunction, no significant valve abnormalities, and no evidence of ischemia on stress testing. Has had orthostatics tested, normal.  Had chest tightness in office, felt like he was having an event. ECG unchanged and normal, vital signs normal.   Neuro workup with CT, MRI, EEG unrevealing. Awaiting results of 72 hr EEG. ENT evaluation without clear etiology  I discussed with him that from a cardiac standpoint, everything is normal. I discussed the etiology of sinus tachycardia at length, reviewing that this is usually driven by another cause through the sympathetic nervous system.  I cannot find an underlying cardiac cause for his symptoms. I do not think additional testing would be beneficial.   I discussed with him at length that there can be non-cardiac etiologies for his chest pain. I do not presume to diagnose him with anxiety/depression, but I wonder if there is some level of neurotransmitter imbalance that may benefit from a trial of low dose medication. I recommended he discuss a trial with his primary care just to see if he feels any better. He declined this at this time.   I am happy to support Mr. Cazarez as he looks for answers, but I do not think he needs additional cardiac workup at this time. He would like to check in after about 4 mos to see if there have been any changes. As always, if there are any new concerns, I would be happy to see him as needed.  TIME SPENT WITH PATIENT: 30 minutes of direct patient care. More than 50% of that time was spent on coordination of care and counseling regarding symptoms and workup/results to date, recommendations.  Plan for follow up: patient prefers  to be seen in 4 mos, offered PRN.  Medication Adjustments/Labs and Tests Ordered: Current medicines are reviewed at length with the patient today.  Concerns regarding medicines are outlined above.  Orders Placed This Encounter  Procedures  . EKG 12-Lead   No orders of the defined types were placed in this encounter.   Patient Instructions  Medication Instructions:  Your Physician recommend you continue on your current medication as directed.    If you need a refill on your cardiac medications before your next appointment, please call your pharmacy.   Lab work: None  Testing/Procedures: None  Follow-Up: At [facility], you and your health needs are our priority.  As part of our continuing mission to provide you with exceptional heart care, we have created designated Provider Care Teams.  These Care Teams include your primary Cardiologist (physician) and Advanced Practice Providers (APPs -  Physician Assistants and Nurse Practitioners) who all work together to provide you with the care you need, when you need it. You will need a follow up appointment in 4 months.  Please call our office 2 months in advance to schedule this appointment.  You may see Buford Dresser, MD or one of the following Advanced Practice Providers on your designated Care Team:  Rhonda Barrett, PA-C . Jory Sims, DNP, ANP    Signed, Buford Dresser, MD PhD 10/12/2018 11:33 AM    [place]

## 2018-10-14 ENCOUNTER — Telehealth: Payer: Self-pay | Admitting: Neurology

## 2018-10-14 NOTE — Telephone Encounter (Signed)
Pt states he is having consistent episodes again and would like to run a couple of test ideas by the provider to see if he can get them ordered for him. Please advise.

## 2018-10-15 ENCOUNTER — Telehealth: Payer: Self-pay | Admitting: Neurology

## 2018-10-15 NOTE — Telephone Encounter (Signed)
I had a lengthy call with patient. He expressed his concern that he is still having these frequent episodes. He feels miserable all the time. They happen when he is sitting down. He always feels like he's going to have them all the time, all day. He woke up this morning ok with a clear head and then as the day has gone on he feels pressure. He said that a few of these episodes did happen when he was on the extended EEG. He is aware results are pending. Last Saturday he had 5 episodes. He said he episodes drain his energy. He relates this to dropping a "quarter tank of gas" every time he has an episode. They may try to take his voice. He feels weak in the L chest when he has an episode. He says the other physicians are telling him they feel it's neurologic. He stated that ENT told him these could be petit mal. He stated that cardiology told him he was ok. He had EKGs done. He says his HR is high all the time and it gets even higher when he has these episodes. 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 He said he was told he was pale. His BP was fine but his HR was high. He said the reason why he doesn't go to the ED when these episodes happen is because they will tell him to f/u with cardiology & neurology. He stated that he talked to a few doctors and they suggested that he have his thyroid checked, TSH. I advised his last TSH in October was normal. He also said a doctor suggested being tested for pheochromocytoma and urine catecholimina (?) vma for overactive adrenal glands.I advised that I would discuss all of this with Dr. Jaynee Eagles and we will get his EEG results and call him back with her advice. He verbalized appreciation. He is also aware of the office closure for inclement weather.

## 2018-10-15 NOTE — Telephone Encounter (Signed)
Ambulatory EEG was normal. He had 8 episodes over 72 hours without any abnormal EEG activity. The episodes are correlated with tachycardia, I understand patient's frustration but cannot find anything neurologic to account for symptoms. Cardiology may consider trying low dose b-blocker but will leave that to their clinical judgement as they know best.

## 2018-10-26 ENCOUNTER — Telehealth: Payer: Self-pay | Admitting: Cardiology

## 2018-10-26 NOTE — Telephone Encounter (Signed)
Pt called he is still have episodes. Saturday he had 5-6 about 30-45 min apart, yesterday 4-5 about 68min- 1hour apart, today he's had 3 about 1- 2 hour apart. Pt said they come on while sitting still or standing still. Pt said he has spoken with Dr Deatra James Christopher/cardiologist, he feels the providers should talk about his case. He is asking for a call back from Dr Jaynee Eagles at [phone]

## 2018-10-26 NOTE — Telephone Encounter (Signed)
New Message  Patient is calling because he spoke with the neurologist Dr. Milta Deiters to discuss his symptoms and she should be reaching out to Dr. Harrell Gave. He states that was told that it could be  pheochromocytoma. He did google the symptoms and her mostly he has thosesymptoms.  Saturday he experienced the episoedes about  5 or 6 times every 45 minutes. As well on Sunday he experienced episodes 3-4 times. Its like a hot flesh. Ends up feeling real weak, excessive sweating  and he is very concern. Please call to discuss.

## 2018-10-27 NOTE — Telephone Encounter (Signed)
I am unfortunately on service at the moment and not in the office, but if there is concern I recommend talking to his primary care to start the process. There are blood and urine studies that can be done to screen for this, and it would then be managed by endocrinology. It is not usually managed by cardiology, but if anyone has questions I would be happy to assist. Thank you for the update.

## 2018-10-27 NOTE — Telephone Encounter (Signed)
I spoke to patient. These are not seizures. We saw then on EEG and they were not seizures. He says he gets sweaty, starts breathing heavy, he feels like he is having a hot flash, he gets clammy, with shortness of breath and it makes him weak, feels his heart beating.  These are not neurologic in nature. I will reach out to his pcp Dr. Karle Starch but I really can;t think of anything more to do for patient. And I'm not sure I really have anything to discuss with Cardiology either. I will send this to his cardiologist and try to get a message to Dr. Karle Starch but I recommend he see his pcp.

## 2018-10-27 NOTE — Telephone Encounter (Signed)
Thanks so much for reaching out. I appreciate your input. I also expressed that we have not found anything cardiac in nature either, and we reviewed his monitor extensively. I had a message from him that he had researched pheochromocytoma. His symptoms to me did not suggest pheo based on what testing I had available, but I recommended he discuss this workup with his primary care. Thank you for all of your assistance.

## 2018-10-28 DIAGNOSIS — M6281 Muscle weakness (generalized): Secondary | ICD-10-CM | POA: Diagnosis not present

## 2018-10-28 DIAGNOSIS — C61 Malignant neoplasm of prostate: Secondary | ICD-10-CM | POA: Diagnosis not present

## 2018-10-28 DIAGNOSIS — M62838 Other muscle spasm: Secondary | ICD-10-CM | POA: Diagnosis not present

## 2018-10-28 DIAGNOSIS — N393 Stress incontinence (female) (male): Secondary | ICD-10-CM | POA: Diagnosis not present

## 2018-10-28 NOTE — Telephone Encounter (Signed)
Pt updated with Dr. Christopher's recommendation and voiced understanding. 

## 2018-10-30 ENCOUNTER — Telehealth: Payer: Self-pay | Admitting: Medical Oncology

## 2018-10-30 NOTE — Telephone Encounter (Signed)
Spoke with Nathan [name] regarding referral to [facility] 3/13 arriving at 8:15am. I informed him he will not need the new packet information since he attended clinic 01/2018.He confirmed appointment.

## 2018-11-02 ENCOUNTER — Encounter: Payer: Self-pay | Admitting: Medical Oncology

## 2018-11-02 DIAGNOSIS — E119 Type 2 diabetes mellitus without complications: Secondary | ICD-10-CM | POA: Diagnosis not present

## 2018-11-02 DIAGNOSIS — E785 Hyperlipidemia, unspecified: Secondary | ICD-10-CM | POA: Diagnosis not present

## 2018-11-02 DIAGNOSIS — R0602 Shortness of breath: Secondary | ICD-10-CM | POA: Diagnosis not present

## 2018-11-02 DIAGNOSIS — R000 Tachycardia, unspecified: Secondary | ICD-10-CM | POA: Diagnosis not present

## 2018-11-03 ENCOUNTER — Encounter: Payer: Self-pay | Admitting: Medical Oncology

## 2018-11-04 DIAGNOSIS — R000 Tachycardia, unspecified: Secondary | ICD-10-CM | POA: Diagnosis not present

## 2018-11-04 DIAGNOSIS — E119 Type 2 diabetes mellitus without complications: Secondary | ICD-10-CM | POA: Diagnosis not present

## 2018-11-04 DIAGNOSIS — E785 Hyperlipidemia, unspecified: Secondary | ICD-10-CM | POA: Diagnosis not present

## 2018-11-05 ENCOUNTER — Telehealth: Payer: Self-pay | Admitting: Medical Oncology

## 2018-11-05 ENCOUNTER — Encounter: Payer: Self-pay | Admitting: Radiation Oncology

## 2018-11-05 NOTE — Telephone Encounter (Signed)
Spoke with patient to confirm appointment for [facility] 3/13 arriving at 8:00 am.

## 2018-11-05 NOTE — Progress Notes (Signed)
GU Location of Tumor / Histology: Locally advanced prostate cancer  If Prostate Cancer, Gleason Score is (4 + 5) and PSA was (19) pre-treatment.    Nathan [name] s/p radical prostatectomy. Post prostatectomy PSA elevated to 0.25.     Past/Anticipated interventions by urology, if any: prostate biopsy, referral to [facility], prostatectomy, PSA, referral back to [facility]  Past/Anticipated interventions by medical oncology, if any: no  Weight changes, if any: no  Bowel/Bladder complaints, if any: Reports gradual improvement of continence but still requires 3 ppd.    Nausea/Vomiting, if any: no  Pain issues, if any:  denies  SAFETY ISSUES:  Prior radiation? denies  Pacemaker/ICD? denies  Possible current pregnancy? no, male patient   Is the patient on methotrexate? denies  Current Complaints / other details:  68 year old male. Married.

## 2018-11-06 ENCOUNTER — Encounter: Payer: Self-pay | Admitting: Radiation Oncology

## 2018-11-06 ENCOUNTER — Other Ambulatory Visit: Payer: Self-pay

## 2018-11-06 ENCOUNTER — Encounter: Payer: Self-pay | Admitting: Medical Oncology

## 2018-11-06 ENCOUNTER — Ambulatory Visit
Admission: RE | Admit: 2018-11-06 | Discharge: 2018-11-06 | Disposition: A | Discharge status: Home / Self Care | Payer: PPO | Source: Ambulatory Visit | Attending: Radiation Oncology | Admitting: Radiation Oncology

## 2018-11-06 ENCOUNTER — Telehealth: Payer: Self-pay | Admitting: Oncology

## 2018-11-06 ENCOUNTER — Inpatient Hospital Stay: Payer: PPO | Attending: Oncology | Admitting: Oncology

## 2018-11-06 VITALS — BP 149/78 | HR 98 | Temp 98.4°F | Resp 18 | Ht 70.0 in | Wt 217.8 lb

## 2018-11-06 DIAGNOSIS — E785 Hyperlipidemia, unspecified: Secondary | ICD-10-CM | POA: Insufficient documentation

## 2018-11-06 DIAGNOSIS — Z923 Personal history of irradiation: Secondary | ICD-10-CM | POA: Insufficient documentation

## 2018-11-06 DIAGNOSIS — C61 Malignant neoplasm of prostate: Secondary | ICD-10-CM

## 2018-11-06 DIAGNOSIS — E119 Type 2 diabetes mellitus without complications: Secondary | ICD-10-CM | POA: Insufficient documentation

## 2018-11-06 DIAGNOSIS — N5201 Erectile dysfunction due to arterial insufficiency: Secondary | ICD-10-CM | POA: Diagnosis not present

## 2018-11-06 DIAGNOSIS — R42 Dizziness and giddiness: Secondary | ICD-10-CM

## 2018-11-06 DIAGNOSIS — Z809 Family history of malignant neoplasm, unspecified: Secondary | ICD-10-CM | POA: Diagnosis not present

## 2018-11-06 DIAGNOSIS — R4182 Altered mental status, unspecified: Secondary | ICD-10-CM

## 2018-11-06 DIAGNOSIS — Z79899 Other long term (current) drug therapy: Secondary | ICD-10-CM | POA: Diagnosis not present

## 2018-11-06 DIAGNOSIS — Z800 Family history of malignant neoplasm of digestive organs: Secondary | ICD-10-CM | POA: Insufficient documentation

## 2018-11-06 DIAGNOSIS — R9721 Rising PSA following treatment for malignant neoplasm of prostate: Secondary | ICD-10-CM | POA: Diagnosis not present

## 2018-11-06 DIAGNOSIS — N393 Stress incontinence (female) (male): Secondary | ICD-10-CM | POA: Diagnosis not present

## 2018-11-06 DIAGNOSIS — Z9079 Acquired absence of other genital organ(s): Secondary | ICD-10-CM | POA: Diagnosis not present

## 2018-11-06 NOTE — Progress Notes (Signed)
                               Care Plan Summary  Name: Mr. Shun Pletz DOB: 12-30-1950   Your Medical Team:   Urologist -  Dr. Raynelle Bring, [facility]  Radiation Oncologist - Dr. Tyler Pita, [facility]   Medical Oncologist - Dr. Zola Button, [place]  Recommendations:  1) Androgen deprivation (hormone injection) 3 months 2) Radiation   * These recommendations are based on information available as of today's consult.      Recommendations may change depending on the results of further tests or exams.  Next Steps:  1) Dr. Lynne Logan office will schedule hormone injection    When appointments need to be scheduled, you will be contacted by [facility] and/or [facility].  Patient provided with business cards for all team members and a copy of "Fall Prevention Patient Safety Sheet".  Questions?  Please do not hesitate to call Cira Rue, RN, BSN, OCN at [phone] any questions or concerns.  Shirlean Mylar is your Oncology Nurse Navigator and is available to assist you while you're receiving your medical care at [facility].

## 2018-11-06 NOTE — Progress Notes (Signed)
Reason for the request:    Prostate cancer  HPI: I was asked by Dr. Alinda Money to evaluate Mr. Nathan [name] for prostate cancer.  He is a 68 year old man currently of [place] was diagnosed with prostate cancer in April 2019.  At that time his PSA was up to 19 and an MRI indicated a 1.6 cm PI-RADS 4 lesion in the left mid prostate.  Biopsy at the time showed a Gleason score 8 and 9 and 3 out of 12 cores.  He has imaging studies which showed no evidence of metastatic disease.  He underwent robotic laparoscopic radical prostatectomy and pelvic lymphadenectomy completed on 03/30/2018.  The final pathology prostate adenocarcinoma Gleason score 4+5 = 9 with a focal extracapsular extension with negative margins.  0 out of 7 lymph nodes showed any evidence of malignancy.  Final pathological staging is T3AN0.  He has recovered reasonably well since his operation with improvement in his continence.  He is PSA on August 25, 2018 was 0.25 and his most recent PSA is 0.29.  Based on these findings he was referred to the prostate cancer multidisciplinary clinic for further discussion.  Clinically, he has reported few issues that has been under investigation including episodes of hyperventilation, presyncopal episode that has been evaluated by neurology and cardiology without any specific findings.  Otherwise he has resumed all his work related duties and has significant improvement in his continence.  He denies any excessive fatigue, tiredness although he does report decrease in his level of energy.  He does not report any headaches, blurry vision, syncope or seizures. Does not report any fevers, chills or sweats.  Does not report any cough, wheezing or hemoptysis.  Does not report any chest pain, palpitation, orthopnea or leg edema.  Does not report any nausea, vomiting or abdominal pain.  Does not report any constipation or diarrhea.  Does not report any skeletal complaints.    Does not report frequency, urgency or hematuria.   Does not report any skin rashes or lesions. Does not report any heat or cold intolerance.  Does not report any lymphadenopathy or petechiae.  Does not report any anxiety or depression.  Remaining review of systems is negative.    Past Medical History:  Diagnosis Date  . Diabetes mellitus without complication ([place])    type 2  . H/O cardiac catheterization   . Hyperlipidemia   . Malignant neoplasm of prostate ([place]) 02/05/2018  :  Past Surgical History:  Procedure Laterality Date  . colonscopy    . LYMPHADENECTOMY Bilateral 03/30/2018   Procedure: LYMPHADENECTOMY, PELVIC;  Surgeon: Raynelle Bring, MD;  Location: [facility];  Service: Urology;  Laterality: Bilateral;  . PROSTATE BIOPSY  01/2018  . ROBOT ASSISTED LAPAROSCOPIC RADICAL PROSTATECTOMY N/A 03/30/2018   Procedure: XI ROBOTIC ASSISTED LAPAROSCOPIC RADICAL PROSTATECTOMY LEVEL 3;  Surgeon: Raynelle Bring, MD;  Location: [facility];  Service: Urology;  Laterality: N/A;  ONLY NEEDS 210 MIN TOTAL FOR PROCEDURE  . vasedtomy  1986  :   Current Outpatient Medications:  .  rosuvastatin (CRESTOR) 20 MG tablet, Take 20 mg by mouth at bedtime. , Disp: , Rfl:  .  sitaGLIPtin-metformin (JANUMET) 50-1000 MG tablet, Take 1 tablet by mouth 2 (two) times daily with a meal., Disp: , Rfl: :  No Known Allergies:  Family History  Problem Relation Age of Onset  . Cancer Mother   :  Social History   Socioeconomic History  . Marital status: Married    Spouse name: Not on file  .  Number of children: Not on file  . Years of education: Not on file  . Highest education level: Not on file  Occupational History  . Occupation: chaplin  Social Needs  . Financial resource strain: Not on file  . Food insecurity:    Worry: Not on file    Inability: Not on file  . Transportation needs:    Medical: Not on file    Non-medical: Not on file  Tobacco Use  . Smoking status: Never Smoker  . Smokeless tobacco: Never Used  Substance and Sexual Activity  .  Alcohol use: Not Currently    Alcohol/week: 0.0 standard drinks    Frequency: Never    Comment: 1970 quit  . Drug use: Never  . Sexual activity: Yes  Lifestyle  . Physical activity:    Days per week: Not on file    Minutes per session: Not on file  . Stress: Not on file  Relationships  . Social connections:    Talks on phone: Not on file    Gets together: Not on file    Attends religious service: Not on file    Active member of club or organization: Not on file    Attends meetings of clubs or organizations: Not on file    Relationship status: Not on file  . Intimate partner violence:    Fear of current or ex partner: Not on file    Emotionally abused: Not on file    Physically abused: Not on file    Forced sexual activity: Not on file  Other Topics Concern  . Not on file  Social History Narrative  . Not on file  :  Pertinent items are noted in HPI.  Exam: ECOG 0 General appearance: alert and cooperative appeared without distress. Head: atraumatic without any abnormalities. Eyes: conjunctivae/corneas clear. PERRL.  Sclera anicteric. Throat: lips, mucosa, and tongue normal; without oral thrush or ulcers. Resp: clear to auscultation bilaterally without rhonchi, wheezes or dullness to percussion. Cardio: regular rate and rhythm, S1, S2 normal, no murmur, click, rub or gallop GI: soft, non-tender; bowel sounds normal; no masses,  no organomegaly Skin: Skin color, texture, turgor normal. No rashes or lesions Lymph nodes: Cervical, supraclavicular, and axillary nodes normal. Neurologic: Grossly normal without any motor, sensory or deep tendon reflexes. Musculoskeletal: No joint deformity or effusion.  CBC    Component Value Date/Time   WBC 7.0 06/11/2018 0447   RBC 5.01 06/11/2018 0447   HGB 13.1 06/11/2018 0447   HCT 42.7 06/11/2018 0447   PLT 253 06/11/2018 0447   MCV 85.2 06/11/2018 0447   MCH 26.1 06/11/2018 0447   MCHC 30.7 06/11/2018 0447   RDW 12.7 06/11/2018  0447   LYMPHSABS 1.5 05/05/2015 1230   MONOABS 0.7 05/05/2015 1230   EOSABS 0.0 05/05/2015 1230   BASOSABS 0.0 05/05/2015 1230     Chemistry      Component Value Date/Time   NA 138 07/29/2018 0916   K 4.6 07/29/2018 0916   CL 100 07/29/2018 0916   CO2 22 07/29/2018 0916   BUN 16 07/29/2018 0916   CREATININE 1.12 07/29/2018 0916      Component Value Date/Time   CALCIUM 9.9 07/29/2018 0916   ALKPHOS 61 05/05/2015 1230   AST 20 05/05/2015 1230   ALT 18 05/05/2015 1230   BILITOT 1.0 05/05/2015 1230       Assessment and Plan:    68 year old man with the following:  1.  Prostate cancer diagnosed in April  2019.  He had a Gleason score of 4+5 = 9 and a PSA of 19.  He underwent a radical prostatectomy in August 2019.  The final pathology showed a Gleason score 4+5 = 9, 0 out of 7 lymph nodes involvement with a final pathological staging is T3aN0.  His postoperative PSA did not become undetectable and has been persistent and may be slowly rising.  His last PSA was 0.29 with a 0.25 in December 2019.  His case was discussed today in the prostate cancer multidisciplinary clinic including his pathology review.  Treatment options were also reviewed with the patient and his family.  Given his persistent PSA and focal extracapsular extension it was recommended that he proceed with adjuvant radiation therapy with 6 months of androgen deprivation.  The rationale for the strategy was discussed today in detail including risk of local relapse as well as micrometastatic disease.  Complication associated with this therapy was discussed today in detail specifically androgen deprivation.  These complications including hot flashes, weight gain, sexual dysfunction among others.  We also discussed possibility of a clinical trial enrollment which will include androgen deprivation therapy and radiation plus or minus Taxotere chemotherapy.  We discussed Taxotere chemotherapy role in treating prostate cancer in  the advanced setting and explained to him the rationale for early incorporation and high risk disease such as him.  He does not appear to be interested in clinical trial although he is interested in pursuing additional therapy after learning more about radiation therapy.  We also assessed the risk of relapse disease and potential future treatment for his prostate cancer which include systemic chemotherapy as well.  All his questions were answered to his satisfaction.  2.  Dizziness and altered mental status: Etiology remains unclear.  Both cardiac and neurological evaluation has ruled out seizures, strokes as well as cardiac etiologies.  Do not believe these episodes are related to his prostate cancer or potential treatment options.  60  minutes was spent with the patient face-to-face today.  More than 50% of time was dedicated to reviewing his disease status, treatment options, complications related therapy the role for systemic therapy and chemotherapy in the treatment of current and future prostate cancer.    Thank you for the referral.  A copy of this consult has been forwarded to the requesting physician.

## 2018-11-06 NOTE — Consult Note (Signed)
[facility]     11/06/2018   --------------------------------------------------------------------------------   Nathan [name]  MRN: [id number]  PRIMARY CARE:  Valaria Good. Karle Starch, MD  DOB: [birth year], 68 year old Male  REFERRING:  Luvenia Redden  SSN: -[id number]  PROVIDER:  Louis Meckel, M.D.    TREATING:  Raynelle Bring, M.D.    LOCATION:  [facility] [phone]   --------------------------------------------------------------------------------   CC/HPI: CC: Prostate Cancer   PCP: Dr. Yong Channel  Location of consult: [facility] - Prostate Cancer Multidisciplinary Clinic   [name] is a 68 year old gentleman with a long standing history of an elevated PSA s/p negative biopsies in 2005 and 2014. His PSA increased to 19.0 prompting an MRI of the prostate that showed a 1.6 cm PI-RADS 4 lesion at the left mid prostate. He underwent a targeted prostate biopsy on 01/12/18 demonstrating Gleason 4+5=9 adenocarcinoma with 7 out of 16 cores positive for malignancy. He was seen in the multidisciplinary clinic in June and elected primary surgical therapy. He underwent a UNS RAL radical prostatectomy and BPLND on 03/30/18 demonstrating pT3a N0 Mx, Gleason 4+5=9 adenocarcinoma with negative surgical margins. Unfortunately, his PSA remained elevated postoperatively at 0.23 in November. His PSA was 0.25 in December and 0.29 last week.   At his visit in January, he was still using about 3 pads per day for incontinence but admittedly had not been aggressive with his physical therapy program. This has improved considerably. Although he still may go through 2-3 pads at work, he states that this is mostly for sanitary purposes. He estimates that he is 95% continent and only leaks approximately 5% of his urine volume now.   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 He has two daughters including one who is a Chief Executive Officer in [place] and  one who is a Designer, jewellery in [place].   Family history: None.   Imaging studies:  MRI (12/23/17): Negative for metastatic disease  CT (01/27/18): Negative for metastatic disease  Bone scan (01/27/18): Negative for metastatic disease   He presents today to reassess his urinary control and discuss options for additional salvage curative therapy.     ALLERGIES: No Allergies    MEDICATIONS: Sildenafil Citrate 100 mg tablet 1 tablet PO as directed PRN  Janumet 50 mg-1,000 mg tablet Oral     GU PSH: Laparoscopy; Lymphadenectomy - 03/30/2018 Prostate Needle Biopsy - 01/12/2018 Robotic Radical Prostatectomy - 03/30/2018    NON-GU PSH: Bmi<30 And >=22 Calc & Docu - 03/09/2018 Doc Meds Verified W/Pt Or Re - 03/09/2018 Pain Neg No Plan - 03/09/2018 Surgical Pathology, Gross And Microscopic Examination For Prostate Needle - 01/12/2018    GU PMH: Stress Incontinence - 10/28/2018, - 10/07/2018, - 09/09/2018, - 09/08/2018, - 08/12/2018, - 07/15/2018, - 06/17/2018, - 06/12/2018, - 05/13/2018, - 04/30/2018, - 03/23/2018 Prostate Cancer - 01/27/2018 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   Diagnosis: pT3a N0 Mx, Gleason 4+5=9 adenocarcinoma with negative surgical margins  Pretreatment PSA: 19.0  Pretreatment SHIM score: 16   NON-GU PMH: Muscle weakness (generalized) - 10/28/2018, - 10/07/2018, - 09/09/2018, - 09/08/2018, - 08/12/2018, -  07/15/2018, - 06/17/2018 Other muscle spasm - 10/28/2018, - 10/07/2018, - 09/09/2018, - 09/08/2018, - 08/12/2018, - 07/15/2018, - 05/13/2018 Hypercholesterolemia Hypertension    FAMILY HISTORY: Family Health Status - Father alive at age 35 - 80 In Family Family Health Status  - Mother's Age - Runs In Family Family Health Status Number - Runs In Family No pertinent family history - Other   SOCIAL HISTORY: Marital Status: Married Preferred Language: English; Ethnicity: ; Race: Black or African American Current Smoking Status: Patient has never smoked.  Has never drank.  Drinks 1 caffeinated drink per day.     Notes: Never A Smoker, Marital History - Currently Married, Caffeine Use, Alcohol Use, Occupation:   REVIEW OF SYSTEMS:    GU Review Male:   Patient denies frequent urination, hard to postpone urination, burning/ pain with urination, get up at night to urinate, leakage of urine, stream starts and stops, trouble starting your streams, and have to strain to urinate .  Gastrointestinal (Lower):   Patient denies diarrhea and constipation.  Gastrointestinal (Upper):   Patient denies nausea and vomiting.  Constitutional:   Patient denies fever, night sweats, weight loss, and fatigue.  Skin:   Patient denies skin rash/ lesion and itching.  Eyes:   Patient denies blurred vision and double vision.  Ears/ Nose/ Throat:   Patient denies sore throat and sinus problems.  Hematologic/Lymphatic:   Patient denies swollen glands and easy bruising.  Cardiovascular:   Patient denies leg swelling and chest pains.  Respiratory:   Patient denies cough and shortness of breath.  Endocrine:   Patient denies excessive thirst.  Musculoskeletal:   Patient denies back pain and joint pain.  Neurological:   Patient denies headaches and dizziness.  Psychologic:   Patient denies depression and anxiety.   VITAL SIGNS: None   MULTI-SYSTEM PHYSICAL EXAMINATION:    Constitutional: Well-nourished. No physical deformities. Normally developed. Good grooming.     PAST DATA REVIEWED:  Source Of History:  Patient  Records Review:   Previous Patient Records  X-[place] Review: MRI Prostate GSORAD: Reviewed Films.     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 Biochemically recurrent prostate cancer: I had a long discussion with Mr. Zeimet and his daughters today. He has already seeing Dr. Alen Blew and Dr. Tammi Klippel earlier this morning. His continence is significantly improved but he is still making progress. We discussed his persistently elevated PSA. His most recent PSA continues to gradually increase but at a relatively slow rate. As such, we have reviewed multiple options for how to proceed. We discussed the option of proceeding with salvage radiation therapy including 6 months of androgen deprivation therapy. We discussed that treatment approach in detail as a very standard approach them reviewed the potential risks and side effects of that approach as well. He has numerous questions that were answered to his stated satisfaction. We also discussed the option of avoiding salvage  curative therapy and proceeding with systemic therapy although he understands that I would delay this until a later time if he elected that approach with the idea monitoring his PSA doubling time in the near future. Finally, we discussed the option of enrollment in [facility] which would involve systemic androgen deprivation therapy with the option of radiation therapy with randomization to chemotherapy versus no chemotherapy.   After discussing all options, he is inclined to proceed with short-term androgen deprivation and salvage radiation therapy. We agreed that we would likely proceed with a 3 month Lupron injection with reassessment of his continence at that time with likely plans to proceed with radiation  shortly thereafter. He would then received an additional 3 months of Lupron to complete a 6 month course. He would like to think over his decision prior to finalizing it. However, he plans to notify me next week and will likely proceed in this direction. If so, he will be scheduled for a Lupron injection and follow-up in 3 months.   2. Incontinence: This is significantly improved. He will continue his pelvic floor exercises and physical therapy. He understands the importance of regaining continence prior radiation treatment.   3. Erectile dysfunction: We did briefly discuss this today. He has seen mild but some improvement of penile engorgement/enlargement. He understands the option of continuing penile rehabilitation and understands that this will not impact his cancer treatment. He does understand the impact that androgen deprivation therapy will have on his libido. Overall, he feels that this is a lower priority currently and may wish to delay treatment until after completion of androgen deprivation.   4. Possible anxiety/panic attacks: He did have an episode during our visit today that he has described to me previously. These have been occurring since last fall and he is undergone an extensive evaluation including a cardiac and neurologic evaluation. It is felt that these may be related to anxiety attacks. Although he does not feel anxious normally, we discussed the many stresses that he is currently dealing with including care of his wife, his job, his cancer situation and the fact that he is facing his own mortality for the 1st time in his life with his prostate cancer diagnosis. He apparently has discussed the option of medical therapy with his primary care physician. We also offered him the option of proceeding with an evaluation by clinical psychology through the [facility]. He will notify me if he does wish to proceed with this option.   Cc: Dr. Yong Channel  Dr. Zola Button  Dr. Tyler Pita         Next Appointment:      Next Appointment: 11/18/2018 10:00 AM    Appointment Type: 60 Physical Therapy    Location: [facility] [phone]    Provider: Lovenia Kim    Reason for Visit: PT

## 2018-11-06 NOTE — Telephone Encounter (Signed)
No los °

## 2018-11-06 NOTE — Addendum Note (Signed)
Encounter addended by: Raynelle Bring, MD on: 11/06/2018 12:24 PM  Actions taken: Clinical Note Signed

## 2018-11-06 NOTE — Progress Notes (Signed)
Radiation Oncology         [phone]) [phone] ________________________________  Multidisciplinary Prostate Cancer Clinic  Initial Radiation Oncology Consultation  Name: Nathan [name] MRN: [id number]  Date: 11/06/2018  DOB: 20-Jun-1951  TI:[id number], Valaria Good., MD  Raynelle Bring, MD   REFERRING PHYSICIAN: Raynelle Bring, MD  DIAGNOSIS: 68 y.o. 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     ICD-10-CM   1. Prostate cancer ([place]) C61   2. Malignant neoplasm of prostate ([place]) C61     HISTORY OF PRESENT ILLNESS::Nathan [name] is a 68 y.o. gentleman.  He was initially seen in prostate clinic on 02/06/2018 for Gleason 4+5 prostate cancer. At that time, he opted to undergo prostatectomy on 03/30/2018 under Dr. Alinda Money. Pathology from the procedure revealed: Gleason 4+5 prostatic adenocarcinoma, focal extracapsular extension, margins not involved, seminal vesicles free of tumor; benign lymph nodes (0/7).  Unfortunately, his PSA remained around 0.25 following surgery, with the last result from 08/25/2018. At his last visit with Dr. Alinda Money on 09/01/2018, they decided to give the patient 2 additional months to continue physical therapy for continence before discussing any salvage options.  The patient reviewed the postoperative PSA results with his urologist and he has kindly been referred today to the multidisciplinary prostate cancer clinic for presentation of pathology and radiology studies in our conference for discussion of potential radiation treatment options and clinical evaluation.   PREVIOUS RADIATION THERAPY: No  PAST MEDICAL HISTORY:  has a past medical history of Diabetes mellitus without complication ([place]), H/O cardiac catheterization, Hyperlipidemia, and Malignant neoplasm of prostate ([place]) (02/05/2018).    PAST SURGICAL HISTORY: Past Surgical History:  Procedure  Laterality Date   colonscopy     LYMPHADENECTOMY Bilateral 03/30/2018   Procedure: LYMPHADENECTOMY, PELVIC;  Surgeon: Raynelle Bring, MD;  Location: [facility];  Service: Urology;  Laterality: Bilateral;   PROSTATE BIOPSY  01/2018   ROBOT ASSISTED LAPAROSCOPIC RADICAL PROSTATECTOMY N/A 03/30/2018   Procedure: XI ROBOTIC ASSISTED LAPAROSCOPIC RADICAL PROSTATECTOMY LEVEL 3;  Surgeon: Raynelle Bring, MD;  Location: [facility];  Service: Urology;  Laterality: N/A;  ONLY NEEDS 210 MIN TOTAL FOR PROCEDURE   vasedtomy  1986    FAMILY HISTORY: family history includes Cancer in his mother.  SOCIAL HISTORY:  reports that he has never smoked. He has never used smokeless tobacco. He reports previous alcohol use. He reports that he does not use drugs.  ALLERGIES: Patient has no known allergies.  MEDICATIONS:  Current Outpatient Medications  Medication Sig Dispense Refill   rosuvastatin (CRESTOR) 20 MG tablet Take 20 mg by mouth at bedtime.      sitaGLIPtin-metformin (JANUMET) 50-1000 MG tablet Take 1 tablet by mouth 2 (two) times daily with a meal.     No current facility-administered medications for this encounter.     REVIEW OF SYSTEMS:  On review of systems, the patient reports that he is doing well overall. He denies any chest pain, shortness of breath, cough, fevers, chills, night sweats, unintended weight changes. He denies any bowel disturbances, and denies abdominal pain, nausea or vomiting. He denies any new musculoskeletal or joint aches or pains. He completed IPSS and SHIM scores. A complete review of systems is obtained and is otherwise negative.   PHYSICAL EXAM:  Wt Readings from Last 3 Encounters:  11/06/18 217 lb 12.8 oz (98.8 kg)  08/13/18 222 lb (100.7 kg)  06/11/18 216 lb (  98 kg)   Temp Readings from Last 3 Encounters:  11/06/18 98.4 F (36.9 C) (Oral)  06/12/18 98.6 F (37 C) (Oral)  03/23/18 98.3 F (36.8 C) (Oral)   BP Readings from Last 3 Encounters:  11/06/18 (!) 149/78   08/13/18 128/82  06/12/18 110/61   Pulse Readings from Last 3 Encounters:  11/06/18 98  08/13/18 (!) 108  06/12/18 (!) 58   Pain Assessment Pain Score: 0-No pain/10  In general this is a well appearing African American gentleman in no acute distress. He is alert and oriented x4 and appropriate throughout the examination. HEENT reveals that the patient is normocephalic, atraumatic. EOMs are intact. PERRLA. Skin is intact without any evidence of gross lesions. Cardiovascular exam reveals a regular rate and rhythm, no clicks rubs or murmurs are auscultated. Chest is clear to auscultation bilaterally. Lymphatic assessment is performed and does not reveal any adenopathy in the cervical, supraclavicular, axillary, or inguinal chains. Abdomen has active bowel sounds in all quadrants and is intact. The abdomen is soft, non tender, non distended. Lower extremities are negative for pretibial pitting edema, deep calf tenderness, cyanosis or clubbing.  KPS = 90  100 - Normal; no complaints; no evidence of disease. 90   - Able to carry on normal activity; minor signs or symptoms of disease. 80   - Normal activity with effort; some signs or symptoms of disease. 60   - Cares for self; unable to carry on normal activity or to do active work. 60   - Requires occasional assistance, but is able to care for most of his personal needs. 50   - Requires considerable assistance and frequent medical care. 4   - Disabled; requires special care and assistance. 13   - Severely disabled; hospital admission is indicated although death not imminent. 10   - Very sick; hospital admission necessary; active supportive treatment necessary. 10   - Moribund; fatal processes progressing rapidly. 0     - Dead  Karnofsky DA, Abelmann [place], Craver LS and [place]chenal [facility] [phone]) The use of the nitrogen mustards in the palliative treatment of carcinoma: with particular reference to bronchogenic carcinoma Cancer 1 [phone]   LABORATORY  DATA:  Lab Results  Component Value Date   WBC 7.0 06/11/2018   HGB 13.1 06/11/2018   HCT 42.7 06/11/2018   MCV 85.2 06/11/2018   PLT 253 06/11/2018   Lab Results  Component Value Date   NA 138 07/29/2018   K 4.6 07/29/2018   CL 100 07/29/2018   CO2 22 07/29/2018   Lab Results  Component Value Date   ALT 18 05/05/2015   AST 20 05/05/2015   ALKPHOS 61 05/05/2015   BILITOT 1.0 05/05/2015     RADIOGRAPHY: No results found.    IMPRESSION/PLAN: 68 y.o. 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   Today we reviewed the findings and workup thus far.  We discussed the natural history of prostate cancer.  We reviewed the the implications of focal extracapsular extension on the risk of prostate cancer recurrence. We reviewed some of the evidence suggesting an advantage for patients who undergo adjuvant radiotherapy in the setting in terms of disease control and overall survival. We also discussed some of the dilemmas related to the available evidence.  We discussed the SWOG trial which did show an improvement in disease-free survival as well as overall survival using  adjuvant radiotherapy. However, we discussed the fact that the study did not carefully control the usage of adjuvant radiotherapy in the observation arm. There is increasing evidence that careful surveillance with ultrasensitive PSA may provide an opportunity for early salvage in patients who undergo observation, which can lead to excellent results in terms of disease control and survival. We discussed radiation treatment directed to the prostatic fossa with regard to the logistics and delivery of external beam radiation treatment.   At the end of the conversation the patient is interested in moving forward with 8 weeks of external beam therapy in combination with ADT. He will be started on ADT and given another  3 months to work on continence before beginning radiation therapy. We will move forward with treatment planning in anticipation of beginning IMRT in the near future     Nicholos Johns, PA-C    Tyler Pita, MD  [place]: [phone]   Fax: [phone] .com   Skype   LinkedIn  This document serves as a record of services personally performed by Tyler Pita, MD and Freeman Caldron, PA-C. It was created on their behalf by Wilburn Mylar, a trained medical scribe. The creation of this record is based on the scribe's personal observations and the provider's statements to them. This document has been checked and approved by the attending provider.

## 2018-11-12 ENCOUNTER — Encounter: Payer: Self-pay | Admitting: Medical Oncology

## 2018-11-12 DIAGNOSIS — Z5111 Encounter for antineoplastic chemotherapy: Secondary | ICD-10-CM | POA: Diagnosis not present

## 2018-11-12 DIAGNOSIS — C61 Malignant neoplasm of prostate: Secondary | ICD-10-CM | POA: Diagnosis not present

## 2018-11-16 DIAGNOSIS — R000 Tachycardia, unspecified: Secondary | ICD-10-CM | POA: Diagnosis not present

## 2018-11-23 MED FILL — DULOXETINE HCL 60 MG CPEP: 60 | 30 days supply | Qty: 30 | Fill #0

## 2018-11-30 MED FILL — JANUMET 50-1,000 MG TABLET: 50-1000 | 30 days supply | Qty: 60 | Fill #1

## 2018-12-28 ENCOUNTER — Telehealth: Payer: Self-pay | Admitting: Cardiology

## 2018-12-28 NOTE — Telephone Encounter (Signed)
Returned call to patient.He stated he would like to come in office to see Dr.Christopher.Stated he is continuing to have episodes of sob.Stated Dr.Christopher is aware of these episodes.Stated episodes ar happening very frequently.Advised I will send message to Dr.Christopher's RN to schedule appointment.

## 2018-12-28 NOTE — Telephone Encounter (Signed)
New Message:   Pt says he needs to see Dr Harrell Gave. He says he wants Dr Harrell Gave to give him a call please. He says what is going on with him, he needs an office visit, not a virtual visit.

## 2018-12-29 NOTE — Telephone Encounter (Signed)
Spoke with pt who states he is still having episodes of a flushing feeling and was advised by MD he work with to schedule in office appointment with his Cardiologist for work up.  Appointment scheduled for Friday 5/8 at 2 pm  COVID-19 Pre-Screening Questions:  . Do you currently have a fever? NO (yes = cancel and refer to pcp for e-visit) . Have you recently travelled on a cruise, internationally, or to [place], [place], [place], [place], [place], or [place], [place] [employer]) ? NO (yes = cancel, stay home, monitor symptoms, and contact pcp or initiate e-visit if symptoms develop) . Have you been in contact with someone that is currently pending confirmation of Covid19 testing or has been confirmed to have the [place] virus?  NO  symptoms, and contact pcp or initiate e-visit if symptoms develop) . Are you currently experiencing fatigue or cough? NO

## 2018-12-31 ENCOUNTER — Telehealth: Payer: Self-pay | Admitting: Cardiology

## 2018-12-31 NOTE — Telephone Encounter (Signed)
Spoke with patient regarding appointment  Per Alisha's phone note patient is in office appointment, patient is aware

## 2018-12-31 NOTE — Telephone Encounter (Signed)
New Message    Pt is returning call, he said he gotten a message on my chart and it says he has a virtual visit but he is scheduled for an office visit and only wants an office visit     Please call

## 2019-01-01 ENCOUNTER — Other Ambulatory Visit: Payer: Self-pay

## 2019-01-01 ENCOUNTER — Ambulatory Visit (INDEPENDENT_AMBULATORY_CARE_PROVIDER_SITE_OTHER): Payer: PPO | Admitting: Cardiology

## 2019-01-01 VITALS — Ht 70.0 in | Wt 224.6 lb

## 2019-01-01 DIAGNOSIS — R0602 Shortness of breath: Secondary | ICD-10-CM | POA: Diagnosis not present

## 2019-01-01 DIAGNOSIS — R42 Dizziness and giddiness: Secondary | ICD-10-CM | POA: Diagnosis not present

## 2019-01-01 NOTE — Patient Instructions (Signed)

## 2019-01-06 ENCOUNTER — Telehealth: Payer: Self-pay | Admitting: *Deleted

## 2019-01-06 ENCOUNTER — Encounter: Payer: Self-pay | Admitting: Cardiology

## 2019-01-06 MED FILL — JANUMET 50-1,000 MG TABLET: 50-1000 | 30 days supply | Qty: 60 | Fill #2

## 2019-01-06 NOTE — Progress Notes (Signed)
Cardiology Office Note:    Date:  01/06/2019   ID:  Nathan [name], DOB [birth year], MRN [id number]  PCP:  Kristopher Glee., MD  Cardiologist:  Buford Dresser, MD PhD  Referring MD: Kristopher Glee., MD   CC: follow up of lightheadedness  History of Present Illness:    Nathan [name] is a 68 y.o. male with a hx of hypertension, hyperlipidemia, type II diabetes, prostate cancer s/p resection who is seen in follow up for the evaluation and management of dizziness. I initially saw him in the office on 07/07/18.  Prior history: He was discharged from the hospital on 06/12/18 after ~2 weeks of dizziness symptoms without LOC but several seconds of "going blank." Neuro workup unremarkable, including CTA head and neck and EEG. Echo also unremarkable. Was not orthostatic in the hospital, but appeared to have intermittent sinus tachycardia. Initial onset: early October (about 10 days after the flu shot). Frequency: intially every other day, then a few times/day around the time of hospitalization, now about twice/day (though now he feels like it could come on any time). Lasts several seconds, no clear triggers or aggravating/alleviating factors.  He has had echo, ETT, monitor workup from a cardiac standpoint. He has also been seen by ENT and neurology.  Today: Requested to be seen in the office, in person. He continues to have brief spells of staring off/going blank, had one in the office today. Very brief. Also feels like he has to periodically stop and take deep breaths to get in air. He has been speaking in general terms to physicians in the ER and wonders if he needs to repeat the cardiovascular workup.  We spent over an hour today reviewing his symptoms as well as all of his workup to date. He is understandably frustrated that no physical etiology for his events has been found. He has with him a prescription for antidepressant/antianxiety from his PCP that he has not yet taken. He is  wondering if I think this is anxiety.  We talked about physiology, nerve responses, reflexes, etc. I tried to explain the limitations of our testing. From a cardiovascular standpoint, his anatomy is normal. We have evaluated his conduction system with monitors, his vascular system with a stress test, his ventricular function and valvular function with an echo, and these have been unrevealing. I told him that we do not have a test that can look specifically at the neurohormonal feedback of the heart, but we know this can be involved. He carries a lot of stress with him in both his personal and professional life, and we know this can drive the sympathetic adrenergic system (potentially explaining his sinus tachycardia).   Past Medical History:  Diagnosis Date   Diabetes mellitus without complication ([place])    type 2   H/O cardiac catheterization    Hyperlipidemia    Malignant neoplasm of prostate ([place]) 02/05/2018    Past Surgical History:  Procedure Laterality Date   colonscopy     LYMPHADENECTOMY Bilateral 03/30/2018   Procedure: LYMPHADENECTOMY, PELVIC;  Surgeon: Raynelle Bring, MD;  Location: [facility];  Service: Urology;  Laterality: Bilateral;   PROSTATE BIOPSY  01/2018   ROBOT ASSISTED LAPAROSCOPIC RADICAL PROSTATECTOMY N/A 03/30/2018   Procedure: XI ROBOTIC ASSISTED LAPAROSCOPIC RADICAL PROSTATECTOMY LEVEL 3;  Surgeon: Raynelle Bring, MD;  Location: [facility];  Service: Urology;  Laterality: N/A;  ONLY NEEDS 210 MIN TOTAL FOR PROCEDURE   vasedtomy  1986    Current Medications: Current Outpatient Medications on  File Prior to Visit  Medication Sig   sitaGLIPtin-metformin (JANUMET) 50-1000 MG tablet Take 1 tablet by mouth 2 (two) times daily with a meal.   rosuvastatin (CRESTOR) 20 MG tablet Take 20 mg by mouth at bedtime.    No current facility-administered medications on file prior to visit.      Allergies:   Patient has no known allergies.   Social History   Socioeconomic  History   Marital status: Married    Spouse name: Not on file   Number of children: Not on file   Years of education: Not on file   Highest education level: Not on file  Occupational History   Occupation: chaplin  Social Needs   Financial resource strain: Not on file   Food insecurity:    Worry: Not on file    Inability: Not on file   Transportation needs:    Medical: Not on file    Non-medical: Not on file  Tobacco Use   Smoking status: Never Smoker   Smokeless tobacco: Never Used  Substance and Sexual Activity   Alcohol use: Not Currently    Alcohol/week: 0.0 standard drinks    Frequency: Never    Comment: 1970 quit   Drug use: Never   Sexual activity: Yes  Lifestyle   Physical activity:    Days per week: Not on file    Minutes per session: Not on file   Stress: Not on file  Relationships   Social connections:    Talks on phone: Not on file    Gets together: Not on file    Attends religious service: Not on file    Active member of club or organization: Not on file    Attends meetings of clubs or organizations: Not on file    Relationship status: Not on file  Other Topics Concern   Not on file  Social History Narrative   Not on file     Family History: The patient's family history includes Cancer in his mother.  ROS:   Please see the history of present illness.  Additional pertinent ROS:  Constitutional: Negative for chills, fever, night sweats, unintentional weight loss  HENT: Negative for ear pain and hearing loss.   Eyes: Negative for loss of vision and eye pain.  Respiratory: Negative for cough, sputum, wheezing.   Cardiovascular: as per HPI Gastrointestinal: Negative for abdominal pain, melena, and hematochezia.  Genitourinary: Negative for dysuria and hematuria.  Musculoskeletal: Negative for falls and myalgias.  Skin: Negative for itching and rash.  Neurological: Negative for focal weakness, focal sensory changes and loss of  consciousness. Positive for lightheadedness/dizziness Endo/Heme/Allergies: Does not bruise/bleed easily.   EKGs/Labs/Other Studies Reviewed:    The following studies were reviewed today: ETT September 05, 2018 The test was stopped because the patient complained of fatigue.   Blood pressure and heart rate demonstrated a normal response to exercise. Blood pressure demonstrated a normal response to exercise. Overall, the patient's exercise capacity was normal.   85% of maximum heart rate was achieved after 1 minutes. Recovery time: 7 minutes. The patient's response to exercise was adequate for diagnosis.  Response to Stress There was no ST segment deviation noted during stress.  Arrhythmias during stress: none.  Arrhythmias during recovery: none.  There were no significant arrhythmias noted during the test.  ECG was interpretable and conclusive.  Stress Measurements   Baseline Vitals  Rest HR 94 bpm    Rest BP 125/83 mmHg    Exercise Time  Exercise  duration (min) 7 min    Exercise duration (sec) 21 sec    Peak Stress Vitals  Peak HR 164 bpm    Peak BP 176/132 mmHg    Exercise Data  MPHR 154 bpm    Percent HR 106 %    RPE 19     Estimated workload 9 METS         Echo 06/11/18 Study Conclusions  - Left ventricle: The cavity size was normal. Wall thickness was   increased in a pattern of mild LVH. The estimated ejection   fraction was 55%. Wall motion was normal; there were no regional   wall motion abnormalities. Doppler parameters are consistent with   abnormal left ventricular relaxation (grade 1 diastolic   dysfunction). - Aortic valve: There was no stenosis. - Mitral valve: There was no significant regurgitation. - Right ventricle: The cavity size was mildly dilated. Systolic   function was normal. - Pulmonary arteries: No complete TR doppler jet so unable to   estimate PA systolic pressure. - Inferior vena cava: The vessel was normal in size. The   respirophasic  diameter changes were in the normal range (>= 50%),   consistent with normal central venous pressure.  Impressions: - Normal LV size with mild LV hypertrophy. EF 55%. Mildly dilated   RV with normal systolic function. No significant valvular   abnormalities.  Monitor 07/2018: Nearly 17 days of data on [employer] monitor. Heart rate range 67-152 bpm, average 106 bpm. All were sinus rhythm/sinus tachycardia.  No atrial fibrillation, SVT, VT, high degree heart block, or pauses noted. PAC burden <1%, PVC burden <1%. 10 patient triggered events, all sinus tachycardia. HR range 106-131 bpm during events. Symptoms were dizzyness/lightheadedness. No significant abnormalities noted.  EKG:  EKG is personally reviewed.  The ekg today shows sinus tach at 108 bpm.  Recent Labs: 06/10/2018: TSH 0.656 06/11/2018: Hemoglobin 13.1; Platelets 253 07/29/2018: BUN 16; Creatinine, Ser 1.12; Potassium 4.6; Sodium 138  Recent Lipid Panel    Component Value Date/Time   CHOL  10/04/2008 1045    176        ATP III CLASSIFICATION:  <200     mg/dL   Desirable  200-239  mg/dL   Borderline High  >=240    mg/dL   High          TRIG 176 (H) 10/04/2008 1045   HDL 22 (L) 10/04/2008 1045   CHOLHDL 8.0 10/04/2008 1045   VLDL 35 10/04/2008 1045   LDLCALC (H) 10/04/2008 1045    119        Total Cholesterol/HDL:CHD Risk Coronary Heart Disease Risk Table                     Men   Women  1/2 Average Risk   3.4   3.3  Average Risk       5.0   4.4  2 X Average Risk   9.6   7.1  3 X Average Risk  23.4   11.0        Use the calculated Patient Ratio above and the CHD Risk Table to determine the patient's CHD Risk.        ATP III CLASSIFICATION (LDL):  <100     mg/dL   Optimal  100-129  mg/dL   Near or Above                    Optimal  130-159  mg/dL  Borderline  160-189  mg/dL   High  >190     mg/dL   Very High    Physical Exam:    VS:  Ht 5\' 10"  (1.778 m)    Wt 224 lb 9.6 oz (101.9 kg)    BMI 32.23 kg/m     No data found. Wt Readings from Last 3 Encounters:  01/01/19 224 lb 9.6 oz (101.9 kg)  11/06/18 217 lb 12.8 oz (98.8 kg)  10/12/18 218 lb (98.9 kg)     GEN: Well nourished, well developed in no acute distress HEENT: Normal NECK: No JVD; No carotid bruits LYMPHATICS: No lymphadenopathy CARDIAC: regular rhythm, normal S1 and S2, no murmurs, rubs, gallops. Radial and DP pulses 2+ bilaterally. RESPIRATORY:  Clear to auscultation without rales, wheezing or rhonchi  ABDOMEN: Soft, non-tender, non-distended MUSCULOSKELETAL:  No edema; No deformity  SKIN: Warm and dry NEUROLOGIC:  Alert and oriented x 3 PSYCHIATRIC:  Normal affect   ASSESSMENT:    1. Dizziness   2. Shortness of breath    PLAN:    Lightheadedness/dizziness, with shortness of breath:  We discussed his cardiac workup at length today. He has had all 4 anatomic cardiac areas evaluated: no arrhythmias on cardiac monitor (even during his symptomatic events), no systolic dysfunction, no significant valve abnormalities, and no evidence of ischemia on stress testing. Has had orthostatics tested, normal. He has had an ECG before during an event of chest tightness, which was normal. Neuro workup unremarkable.   See my comments in HPI. We spoke for over an hour about his symptoms and workup.  I cannot find an underlying cardiac cause for his symptoms. I do not think repeating cardiac testing would be beneficial as we did the tests while he was symptomatic and they were unremarkable.   I gave him my honest personal opinion that a trial of the antianxiety/antidepressant medication prescribed to him by his PCP is a reasonable start. I counseled him that the effects are not instantaneous. He wants to know what to expect. I noted that these medications often take 6 weeks to reach full effect, but he may start to notice changes sooner.  He is amenable to trying this approach. He will send [place] a message in 3 weeks to let us know how he is  doing, and we will follow up at the 6 week mark to see where he is with his symptoms.   TIME SPENT WITH PATIENT: >60 minutes of direct patient care. More than 50% of that time was spent on coordination of care and counseling regarding symptoms and workup/results to date, recommendations.  Plan for follow up: patient wishes to touch base in 6 weeks  Medication Adjustments/Labs and Tests Ordered: Current medicines are reviewed at length with the patient today.  Concerns regarding medicines are outlined above.  Orders Placed This Encounter  Procedures   EKG 12-Lead   No orders of the defined types were placed in this encounter.   Patient Instructions  Medication Instructions:  Your Physician recommend you continue on your current medication as directed.    If you need a refill on your cardiac medications before your next appointment, please call your pharmacy.   Lab work: None  Testing/Procedures: None  Follow-Up: At [facility], you and your health needs are our priority.  As part of our continuing mission to provide you with exceptional heart care, we have created designated Provider Care Teams.  These Care Teams include your primary Cardiologist (physician) and Advanced Practice  Providers (APPs -  Physician Assistants and Nurse Practitioners) who all work together to provide you with the care you need, when you need it. You will need a follow up appointment in 6 weeks.  Please call our office 2 months in advance to schedule this appointment.  You may see Buford Dresser, MD or one of the following Advanced Practice Providers on your designated Care Team:   Rosaria Ferries, PA-C  Jory Sims, DNP, ANP        Signed, Buford Dresser, MD PhD 01/06/2019 3:54 PM    [place]

## 2019-01-06 NOTE — Telephone Encounter (Signed)
Left message regarding 6 week follow up appointment with Dr., Harrell Gave (02/15/19 at 9:40am)--Will also mail calendar

## 2019-01-26 ENCOUNTER — Telehealth: Payer: Self-pay | Admitting: Cardiology

## 2019-01-26 NOTE — Telephone Encounter (Signed)
Pt states lightheadedness and dizziness has stopped since seeing Dr. Harrell Gave.  Every few days may have an episode where he has SOB or what he describes as "huffing and puffing" for a few seconds.  All symptoms are much improved or resolved since he seen Dr. Harrell Gave.  He did not start the antianxiety/antidepressant prescribed by PCP.  Pt decided to cancel virtual visit set for 6/22.  Pt would rather wait to see Dr. Harrell Gave in person.  Advised I would send message to Dr. Harrell Gave to update her and get her thoughts on follow up face to face.

## 2019-01-26 NOTE — Telephone Encounter (Signed)
Follow Up:   Pt said he was supposed to call back and give Dr Harrell Gave an update. Pt said he decided not to take the medicine that he had discussed with Dr Harrell Gave. He said the episodes are much better. He is down to maybe having one episode a week.

## 2019-01-27 NOTE — Telephone Encounter (Signed)
This is wonderful news--so glad to hear things have gotten better for him. I am always open to a visit in the interim if needed, but let's plan to have him come back in around 3 mos (hopefully in person) to see how he is doing. Thanks!

## 2019-01-28 NOTE — Telephone Encounter (Signed)
Pt updated. Appointment scheduled for 9/4 at 8:30 am with Dr. Harrell Gave.

## 2019-01-28 NOTE — Telephone Encounter (Signed)
Left message to call back  

## 2019-02-09 MED FILL — JANUMET 50-1,000 MG TABLET: 50-1000 | 30 days supply | Qty: 60 | Fill #3

## 2019-02-12 ENCOUNTER — Telehealth: Payer: Self-pay | Admitting: Cardiology

## 2019-02-12 NOTE — Telephone Encounter (Signed)
  Patient is calling because he had a note that he was supposed to touch base with the office today but he is not sure what it was about. He thinks it may be in regards to previous conversation earlier in the month.

## 2019-02-12 NOTE — Telephone Encounter (Signed)
Thanks so much for the update! This is great news. I appreciate it.

## 2019-02-12 NOTE — Telephone Encounter (Signed)
Called patient back- he states that he wanted to touch base and let Dr.Christopher know that he is still doing well.  Dizziness is not as bad as it was, he only has issues maybe ever 3 days, but no longer everyday and he has no issues.

## 2019-02-15 ENCOUNTER — Telehealth: Payer: PPO | Admitting: Cardiology

## 2019-02-16 MED FILL — ONETOUCH VERIO FLEX SYSTEM: W/DEVICE | 30 days supply | Qty: 1 | Fill #0

## 2019-02-16 MED FILL — ONE TOUCH VERIO TEST STRIP: 50 days supply | Qty: 50 | Fill #0

## 2019-02-16 MED FILL — ONETOUCH DELICA PLUS LANCET: 90 days supply | Qty: 100 | Fill #0

## 2019-02-17 DIAGNOSIS — C61 Malignant neoplasm of prostate: Secondary | ICD-10-CM | POA: Diagnosis not present

## 2019-02-24 DIAGNOSIS — N393 Stress incontinence (female) (male): Secondary | ICD-10-CM | POA: Diagnosis not present

## 2019-02-24 DIAGNOSIS — N5201 Erectile dysfunction due to arterial insufficiency: Secondary | ICD-10-CM | POA: Diagnosis not present

## 2019-02-24 DIAGNOSIS — C61 Malignant neoplasm of prostate: Secondary | ICD-10-CM | POA: Diagnosis not present

## 2019-03-03 DIAGNOSIS — K648 Other hemorrhoids: Secondary | ICD-10-CM | POA: Diagnosis not present

## 2019-03-03 DIAGNOSIS — Z800 Family history of malignant neoplasm of digestive organs: Secondary | ICD-10-CM | POA: Diagnosis not present

## 2019-03-03 DIAGNOSIS — Z1211 Encounter for screening for malignant neoplasm of colon: Secondary | ICD-10-CM | POA: Diagnosis not present

## 2019-03-03 DIAGNOSIS — D123 Benign neoplasm of transverse colon: Secondary | ICD-10-CM | POA: Diagnosis not present

## 2019-03-03 DIAGNOSIS — D120 Benign neoplasm of cecum: Secondary | ICD-10-CM | POA: Diagnosis not present

## 2019-03-03 DIAGNOSIS — K5730 Diverticulosis of large intestine without perforation or abscess without bleeding: Secondary | ICD-10-CM | POA: Diagnosis not present

## 2019-03-03 DIAGNOSIS — Z86010 Personal history of colonic polyps: Secondary | ICD-10-CM | POA: Diagnosis not present

## 2019-03-30 MED FILL — JANUMET 50-1,000 MG TABLET: 50-1000 | 30 days supply | Qty: 60 | Fill #4

## 2019-04-16 ENCOUNTER — Telehealth: Payer: Self-pay | Admitting: Radiation Oncology

## 2019-04-16 NOTE — Telephone Encounter (Signed)
Patient phoned this RN back. RN reviewed MyChart sign in instructions with patient and lead him through the process. Patient verbalized understanding of all reviewed.

## 2019-04-16 NOTE — Telephone Encounter (Signed)
Received voicemail message from patient requesting a return call to discuss questions about upcoming appointment on 04/20/2019. Phoned patient back. No answer. Explained the encounter is scheduled to occur via [facility] on 04/20/2019 and begin with a nurse encounter at 1:30 pm. Explained if he is having trouble finding the link to the appointment or struggling with technical issues to phone back and request Leonard Downing ([facility] scheduler.)

## 2019-04-20 ENCOUNTER — Ambulatory Visit: Payer: PPO

## 2019-04-21 DIAGNOSIS — F411 Generalized anxiety disorder: Secondary | ICD-10-CM | POA: Diagnosis not present

## 2019-04-21 DIAGNOSIS — Z23 Encounter for immunization: Secondary | ICD-10-CM | POA: Diagnosis not present

## 2019-04-21 DIAGNOSIS — E119 Type 2 diabetes mellitus without complications: Secondary | ICD-10-CM | POA: Diagnosis not present

## 2019-04-21 DIAGNOSIS — E785 Hyperlipidemia, unspecified: Secondary | ICD-10-CM | POA: Diagnosis not present

## 2019-04-21 DIAGNOSIS — Z0000 Encounter for general adult medical examination without abnormal findings: Secondary | ICD-10-CM | POA: Diagnosis not present

## 2019-04-22 DIAGNOSIS — E119 Type 2 diabetes mellitus without complications: Secondary | ICD-10-CM | POA: Diagnosis not present

## 2019-04-22 DIAGNOSIS — E785 Hyperlipidemia, unspecified: Secondary | ICD-10-CM | POA: Diagnosis not present

## 2019-04-27 ENCOUNTER — Other Ambulatory Visit: Payer: Self-pay

## 2019-04-27 ENCOUNTER — Ambulatory Visit: Payer: PPO

## 2019-04-27 ENCOUNTER — Ambulatory Visit: Payer: PPO | Admitting: Urology

## 2019-04-27 ENCOUNTER — Ambulatory Visit
Admission: RE | Admit: 2019-04-27 | Discharge: 2019-04-27 | Disposition: A | Discharge status: Home / Self Care | Payer: PPO | Source: Ambulatory Visit | Attending: Radiation Oncology | Admitting: Radiation Oncology

## 2019-04-27 ENCOUNTER — Telehealth: Payer: PPO | Admitting: Urology

## 2019-04-27 ENCOUNTER — Telehealth: Payer: Self-pay

## 2019-04-27 DIAGNOSIS — R9721 Rising PSA following treatment for malignant neoplasm of prostate: Secondary | ICD-10-CM | POA: Diagnosis not present

## 2019-04-27 DIAGNOSIS — C61 Malignant neoplasm of prostate: Secondary | ICD-10-CM | POA: Diagnosis not present

## 2019-04-27 DIAGNOSIS — Z9079 Acquired absence of other genital organ(s): Secondary | ICD-10-CM | POA: Diagnosis not present

## 2019-04-27 DIAGNOSIS — Z9289 Personal history of other medical treatment: Secondary | ICD-10-CM | POA: Diagnosis not present

## 2019-04-27 NOTE — Progress Notes (Signed)
Radiation Oncology         [phone] ________________________________    Follow Up New outpatient visit  Name: Nathan [name] MRN: [id number]  Date: 04/27/2019  DOB: [birth year]  [id number], Valaria Good., MD  Raynelle Bring, MD   REFERRING PHYSICIAN: Raynelle Bring, MD  DIAGNOSIS: 68 y.o. 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    ICD-10-CM   1. Malignant neoplasm of prostate ([place])  C61     HISTORY OF PRESENT ILLNESS::Nathan [name] is a 68 y.o. gentleman.  He was initially seen in prostate clinic on 02/06/2018 for Gleason 4+5 prostate cancer. At that time, he opted to undergo RALP with BPLND on 03/30/2018 with Dr. Alinda Money. Final surgial pathology revealed Gleason 4+5 prostatic adenocarcinoma with focal extracapsular extension, but negative margins, no seminal vesicle involvement and 0/7 lymph nodes involved.  His postoperative PSA remained detectable at 0.23 06/2018 and slowly increasing to 0.29 when repeated on 10/28/2018. He was seen in the multidisciplinary prostate cancer clinic on 11/06/2018 to discuss the potential for salvage radiotherapy. The recommendation at that time was to start ADT to allow him additional time to work on regaining continence.  He started ADT in 10/2018 and a repeat PSA on 02/19/19 was undetectable.  Since we saw him last, he has made significant improvement in his bladder control and is no longer requiring pads for protection and ready to proceed with salvage radiation.  He has kindly been referred back to [place] to review salvage radiation treatment options.   PREVIOUS RADIATION THERAPY: No  PAST MEDICAL HISTORY:  has a past medical history of Diabetes mellitus without complication ([place]), H/O cardiac catheterization, Hyperlipidemia, and Malignant neoplasm of prostate ([place]) (02/05/2018).    PAST SURGICAL HISTORY: Past Surgical  History:  Procedure Laterality Date  . colonscopy    . LYMPHADENECTOMY Bilateral 03/30/2018   Procedure: LYMPHADENECTOMY, PELVIC;  Surgeon: Raynelle Bring, MD;  Location: [facility];  Service: Urology;  Laterality: Bilateral;  . PROSTATE BIOPSY  01/2018  . ROBOT ASSISTED LAPAROSCOPIC RADICAL PROSTATECTOMY N/A 03/30/2018   Procedure: XI ROBOTIC ASSISTED LAPAROSCOPIC RADICAL PROSTATECTOMY LEVEL 3;  Surgeon: Raynelle Bring, MD;  Location: [facility];  Service: Urology;  Laterality: N/A;  ONLY NEEDS 210 MIN TOTAL FOR PROCEDURE  . vasedtomy  1986    FAMILY HISTORY: family history includes Cancer in his mother.  SOCIAL HISTORY:  reports that he has never smoked. He has never used smokeless tobacco. He reports previous alcohol use. He reports that he does not use drugs.  ALLERGIES: Patient has no known allergies.  MEDICATIONS:  Current Outpatient Medications  Medication Sig Dispense Refill  . sitaGLIPtin-metformin (JANUMET) 50-1000 MG tablet Take 1 tablet by mouth 2 (two) times daily with a meal.    . rosuvastatin (CRESTOR) 20 MG tablet Take 20 mg by mouth at bedtime.      No current facility-administered medications for this encounter.     REVIEW OF SYSTEMS:  On review of systems, the patient reports that he is doing well overall. He denies any chest pain, shortness of breath, cough, fevers, chills, night sweats, unintended weight changes. He denies any bowel disturbances, and denies abdominal pain, nausea or vomiting. He has made good improvement in his bladder continence over the past 3 months and is no longer requiring pads for protection.  He denies any new musculoskeletal or joint aches or  pains.  A complete review of systems is obtained and is otherwise negative.   PHYSICAL EXAM:  Wt Readings from Last 3 Encounters:  01/01/19 224 lb 9.6 oz (101.9 kg)  11/06/18 217 lb 12.8 oz (98.8 kg)  08/13/18 222 lb (100.7 kg)   Temp Readings from Last 3 Encounters:  11/06/18 98.4 F (36.9 C) (Oral)   06/12/18 98.6 F (37 C) (Oral)  03/23/18 98.3 F (36.8 C) (Oral)   BP Readings from Last 3 Encounters:  11/06/18 (!) 149/78  08/13/18 128/82  06/12/18 110/61   Pulse Readings from Last 3 Encounters:  11/06/18 98  08/13/18 (!) 108  06/12/18 (!) 58   Pain Assessment Pain Score: 0-No pain/10  In general this is a well appearing African American gentleman in no acute distress. He's alert and oriented x4 and appropriate throughout the examination. Cardiopulmonary assessment is negative for acute distress and he exhibits normal effort.   KPS = 100  100 - Normal; no complaints; no evidence of disease. 90   - Able to carry on normal activity; minor signs or symptoms of disease. 80   - Normal activity with effort; some signs or symptoms of disease. 53   - Cares for self; unable to carry on normal activity or to do active work. 60   - Requires occasional assistance, but is able to care for most of his personal needs. 50   - Requires considerable assistance and frequent medical care. 5   - Disabled; requires special care and assistance. 28   - Severely disabled; hospital admission is indicated although death not imminent. 61   - Very sick; hospital admission necessary; active supportive treatment necessary. 10   - Moribund; fatal processes progressing rapidly. 0     - Dead  Karnofsky DA, Abelmann [place], Craver LS and Burchenal [facility] [phone]) The use of the nitrogen mustards in the palliative treatment of carcinoma: with particular reference to bronchogenic carcinoma Cancer 1 [phone]   LABORATORY DATA:  Lab Results  Component Value Date   WBC 7.0 06/11/2018   HGB 13.1 06/11/2018   HCT 42.7 06/11/2018   MCV 85.2 06/11/2018   PLT 253 06/11/2018   Lab Results  Component Value Date   NA 138 07/29/2018   K 4.6 07/29/2018   CL 100 07/29/2018   CO2 22 07/29/2018   Lab Results  Component Value Date   ALT 18 05/05/2015   AST 20 05/05/2015   ALKPHOS 61 05/05/2015   BILITOT 1.0 05/05/2015      RADIOGRAPHY: No results found.    IMPRESSION/PLAN: 68 y.o. 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  Today I reviewed the findings and workup thus far.  We reviewed the natural history of prostate cancer and the implications of focal extracapsular extension on the risk of prostate cancer recurrence, especially in light of a detectable, rising PSA postoperatively. We discussed radiation treatment directed to the prostatic fossa with regard to the details of logistics and delivery of external beam radiation treatment.We discussed the recommendation for a 7.5 week curse of daily radiation treatments directed at the prostate fossa and surrounding pelvic lymph nodes. We reviewed the risks, benefits, short and long-term effects associated with radiotherapy.  He was encouraged to ask questions that were answered to his stated satisfaction.   At the end of the conversation the patient is interested in proceeding  with 7.5 weeks of external beam therapy to the prostate fossa. We will share our discussion with Dr. Alinda Money and move forward with treatment planning with a CT SIM at 10 am on 05/04/19 in anticipation of beginning his radiation treatments in the near future. He appears to have a good understanding of his disease and our recommendations which are of curative intent and he is comfortable and in agreement with the stated plan.  He knows to call at any time with any questions or concerns.  Given current concerns for patient exposure during the COVID-19 pandemic, this encounter was conducted via video-enabled MyChart visit.  The patient has given verbal consent for this type of encounter. The time spent during this encounter was 35 minutes. The attendants for this meeting include Marketta Valadez PA-C, and patient, [place] During the encounter, Ary Rudnick PA-C was  located at [facility] Radiation Oncology Department.  Patient, Blakelee Fulco was located at home.    Nicholos Johns, PA-C    Tyler Pita, MD  [facility] Direct Dial: [phone]  Fax: [phone] [email or web address]  Skype  [email or web address]  This document serves as a record of services personally performed by Tyler Pita, MD and Freeman Caldron, PA-C. It was created on their behalf by Wilburn Mylar, a trained medical scribe. The creation of this record is based on the scribe's personal observations and the provider's statements to them. This document has been checked and approved by the attending provider.

## 2019-04-27 NOTE — Telephone Encounter (Signed)
Called pt to changed appointment to virtual visit. Pt state he would prefer to be seen in office. Appointment rescheduled for 05/04/19 at 3:20 with Dr. Harrell Gave.

## 2019-04-27 NOTE — Patient Instructions (Signed)
Coronavirus (COVID-19) Are you at risk?  Are you at risk for the Coronavirus (COVID-19)?  To be considered HIGH RISK for Coronavirus (COVID-19), you have to meet the following criteria:  . Traveled to [place], [place], [place], [place] or [place]; or in the [place] to [place], [place], [place], or [place]; and have fever, cough, and shortness of breath within the last 2 weeks of travel OR . Been in close contact with a person diagnosed with COVID-19 within the last 2 weeks and have fever, cough, and shortness of breath . IF YOU DO NOT MEET THESE CRITERIA, YOU ARE CONSIDERED LOW RISK FOR COVID-19.  What to do if you are HIGH RISK for COVID-19?  . If you are having a medical emergency, call 911. . Seek medical care right away. Before you go to a doctor's office, urgent care or emergency department, call ahead and tell them about your recent travel, contact with someone diagnosed with COVID-19, and your symptoms. You should receive instructions from your physician's office regarding next steps of care.  . When you arrive at healthcare provider, tell the healthcare staff immediately you have returned from visiting [place], [place], [place], [place] or [place]; or traveled in the [place] to [place], [place], [place], or [place]; in the last two weeks or you have been in close contact with a person diagnosed with COVID-19 in the last 2 weeks.   . Tell the health care staff about your symptoms: fever, cough and shortness of breath. . After you have been seen by a medical provider, you will be either: o Tested for (COVID-19) and discharged home on quarantine except to seek medical care if symptoms worsen, and asked to  - Stay home and avoid contact with others until you get your results (4-5 days)  - Avoid travel on public transportation if possible (such as bus, train, or airplane) or o Sent to the Emergency Department by EMS for evaluation, COVID-19 testing, and possible  admission depending on your condition and test results.  What to do if you are LOW RISK for COVID-19?  Reduce your risk of any infection by using the same precautions used for avoiding the common cold or flu:  . Wash your hands often with soap and warm water for at least 20 seconds.  If soap and water are not readily available, use an alcohol-based hand sanitizer with at least 60% alcohol.  . If coughing or sneezing, cover your mouth and nose by coughing or sneezing into the elbow areas of your shirt or coat, into a tissue or into your sleeve (not your hands). . Avoid shaking hands with others and consider head nods or verbal greetings only. . Avoid touching your eyes, nose, or mouth with unwashed hands.  . Avoid close contact with people who are sick. . Avoid places or events with large numbers of people in one location, like concerts or sporting events. . Carefully consider travel plans you have or are making. . If you are planning any travel outside or inside the [place], visit the CDC's Travelers' Health webpage for the latest health notices. . If you have some symptoms but not all symptoms, continue to monitor at home and seek medical attention if your symptoms worsen. . If you are having a medical emergency, call 911.   ADDITIONAL HEALTHCARE OPTIONS FOR PATIENTS  [facility] / e-Visit: [email or web address]         [facility]: [phone]  [facility]: [phone]                   [facility]: [phone]   

## 2019-04-28 ENCOUNTER — Telehealth: Payer: Self-pay | Admitting: Radiation Oncology

## 2019-04-28 NOTE — Telephone Encounter (Signed)
Received voicemail message from patient requesting return call. Phoned patient back promptly. Patient questions if he needs a driver and if he can lift things while receiving prostate radiation. Assured patient he will be able to drive to and from all radiation appointments. Explained radiation treatments will not impede or be influenced by him lifting things. Patient verbalized understanding and expressed appreciation for the return call.

## 2019-04-30 ENCOUNTER — Ambulatory Visit: Payer: PPO | Admitting: Cardiology

## 2019-04-30 ENCOUNTER — Encounter: Payer: Self-pay | Admitting: Cardiology

## 2019-04-30 ENCOUNTER — Telehealth: Payer: Self-pay | Admitting: *Deleted

## 2019-04-30 NOTE — Telephone Encounter (Signed)
Called patient to remind of appt. For 05-04-19, lvm for a return call

## 2019-05-04 ENCOUNTER — Ambulatory Visit
Admission: RE | Admit: 2019-05-04 | Discharge: 2019-05-04 | Disposition: A | Discharge status: Home / Self Care | Payer: PPO | Source: Ambulatory Visit | Attending: Radiation Oncology | Admitting: Radiation Oncology

## 2019-05-04 ENCOUNTER — Encounter: Payer: Self-pay | Admitting: Cardiology

## 2019-05-04 ENCOUNTER — Ambulatory Visit (INDEPENDENT_AMBULATORY_CARE_PROVIDER_SITE_OTHER): Payer: PPO | Admitting: Cardiology

## 2019-05-04 ENCOUNTER — Other Ambulatory Visit: Payer: Self-pay

## 2019-05-04 ENCOUNTER — Telehealth: Payer: Self-pay | Admitting: Radiation Oncology

## 2019-05-04 VITALS — BP 134/78 | HR 109 | Ht 70.0 in | Wt 216.0 lb

## 2019-05-04 DIAGNOSIS — R072 Precordial pain: Secondary | ICD-10-CM

## 2019-05-04 DIAGNOSIS — Z7189 Other specified counseling: Secondary | ICD-10-CM | POA: Diagnosis not present

## 2019-05-04 DIAGNOSIS — C61 Malignant neoplasm of prostate: Secondary | ICD-10-CM | POA: Insufficient documentation

## 2019-05-04 DIAGNOSIS — Z510 Encounter for antineoplastic radiation therapy: Secondary | ICD-10-CM | POA: Insufficient documentation

## 2019-05-04 NOTE — Patient Instructions (Signed)
Medication Instructions:  Your Physician recommend you continue on your current medication as directed.    If you need a refill on your cardiac medications before your next appointment, please call your pharmacy.   Lab work: None  Testing/Procedures: None  Follow-Up: At [facility], you and your health needs are our priority.  As part of our continuing mission to provide you with exceptional heart care, we have created designated Provider Care Teams.  These Care Teams include your primary Cardiologist (physician) and Advanced Practice Providers (APPs -  Physician Assistants and Nurse Practitioners) who all work together to provide you with the care you need, when you need it. You will need a follow up appointment as needed.  Please call our office 2 months in advance to schedule this appointment.  You may see Dr. Harrell Gave or one of the following Advanced Practice Providers on your designated Care Team:   Rosaria Ferries, PA-C . Jory [place], DNP, ANP

## 2019-05-04 NOTE — Telephone Encounter (Signed)
Patient simulated this morning. Received voicemail message from patient requesting to change all Tuesday radiation appointments to 1000. Informed therapist on L1 via email of this request and provided them with the patient's contact number. Therapist handle radiation therapy scheduling not nursing.

## 2019-05-04 NOTE — Addendum Note (Signed)
Encounter addended by: Tyler Pita, MD on: 05/04/2019 3:43 PM  Actions taken: Medication List reviewed, Problem List reviewed, Allergies reviewed

## 2019-05-04 NOTE — Progress Notes (Signed)
  Radiation Oncology         [phone] ________________________________  Name: Nathan [name] MRN: [id number]  Date: 05/04/2019  DOB: 22-Jan-1951  SIMULATION AND TREATMENT PLANNING NOTE    ICD-10-CM   1. Malignant neoplasm of prostate ([place])  C61     DIAGNOSIS:  68 y.o. 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  NARRATIVE:  The patient was brought to the [facility].  Identity was confirmed.  All relevant records and images related to the planned course of therapy were reviewed.  The patient freely provided informed written consent to proceed with treatment after reviewing the details related to the planned course of therapy. The consent form was witnessed and verified by the simulation staff.  Then, the patient was set-up in a stable reproducible supine position for radiation therapy.  A vacuum lock pillow device was custom fabricated to position his legs in a reproducible immobilized position.  Then, I performed a urethrogram under sterile conditions to identify the prostatic bed.  CT images were obtained.  Surface markings were placed.  The CT images were loaded into the planning software.  Then the prostate bed target, pelvic lymph node target and avoidance structures including the rectum, bladder, bowel and hips were contoured.  Treatment planning then occurred.  The radiation prescription was entered and confirmed.  A total of one complex treatment devices were fabricated. I have requested : Intensity Modulated Radiotherapy (IMRT) is medically necessary for this case for the following reason:  Rectal sparing.Marland Kitchen  PLAN:  The patient will receive 45 Gy in 25 fractions of 1.8 Gy, followed by a boost to the prostate bed to a total dose of 68.4 Gy with 13 additional fractions of 1.8 Gy.   ________________________________  Sheral Apley Tammi Klippel, M.D.

## 2019-05-04 NOTE — Addendum Note (Signed)
Encounter addended by: Tyler Pita, MD on: 05/04/2019 3:42 PM  Actions taken: Medication List reviewed, Problem List reviewed, Allergies reviewed

## 2019-05-04 NOTE — Progress Notes (Signed)
Cardiology Office Note:    Date:  05/04/2019   ID:  [name], DOB 11-22-1950, MRN [id number]  PCP:  Kristopher Glee., MD  Cardiologist:  Buford Dresser, MD PhD  Referring MD: Kristopher Glee., MD   CC: follow up of lightheadedness  History of Present Illness:    SHADRACH GHAFOOR is a 68 y.o. male with a hx of hypertension, hyperlipidemia, type II diabetes, prostate cancer s/p resection who is seen in follow up for the evaluation and management of dizziness. I initially saw him in the office on 07/07/18.  Prior history: He was discharged from the hospital on 06/12/18 after ~2 weeks of dizziness symptoms without LOC but several seconds of "going blank." Neuro workup unremarkable, including CTA head and neck and EEG. Echo also unremarkable. Was not orthostatic in the hospital, but appeared to have intermittent sinus tachycardia. Initial onset: early October (about 10 days after the flu shot). Frequency: intially every other day, then a few times/day around the time of hospitalization, now about twice/day (though now he feels like it could come on any time). Lasts several seconds, no clear triggers or aggravating/alleviating factors.  He has had echo, ETT, monitor workup from a cardiac standpoint. He has also been seen by ENT and neurology.  Today:  Spells are gone, none in several months. No near syncope. Feeling well overall. Starting radiation therapy again for prostate (rising PSA). 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   Only cardiac concern today is rare brief, sharp left rib pain. Nonexertional, very brief. Reviewed prior ETT was low risk. Counseled on red flag warning signs that need immediate medical attention.  Denies shortness of breath at rest or with normal exertion. No PND, orthopnea, LE edema or unexpected weight gain. No syncope or palpitations.  Past Medical History:  Diagnosis Date  .  Diabetes mellitus without complication ([place])    type 2  . H/O cardiac catheterization   . Hyperlipidemia   . Malignant neoplasm of prostate ([place]) 02/05/2018    Past Surgical History:  Procedure Laterality Date  . colonscopy    . LYMPHADENECTOMY Bilateral 03/30/2018   Procedure: LYMPHADENECTOMY, PELVIC;  Surgeon: Raynelle Bring, MD;  Location: [facility];  Service: Urology;  Laterality: Bilateral;  . PROSTATE BIOPSY  01/2018  . ROBOT ASSISTED LAPAROSCOPIC RADICAL PROSTATECTOMY N/A 03/30/2018   Procedure: XI ROBOTIC ASSISTED LAPAROSCOPIC RADICAL PROSTATECTOMY LEVEL 3;  Surgeon: Raynelle Bring, MD;  Location: [facility];  Service: Urology;  Laterality: N/A;  ONLY NEEDS 210 MIN TOTAL FOR PROCEDURE  . vasedtomy  1986    Current Medications: Current Outpatient Medications on File Prior to Visit  Medication Sig  . rosuvastatin (CRESTOR) 20 MG tablet Take 20 mg by mouth at bedtime.   . sitaGLIPtin-metformin (JANUMET) 50-1000 MG tablet Take 1 tablet by mouth 2 (two) times daily with a meal.   No current facility-administered medications on file prior to visit.      Allergies:   Patient has no known allergies.   Social History   Socioeconomic History  . Marital status: Married    Spouse name: Not on file  . Number of children: Not on file  . Years of education: Not on file  . Highest education level: Not on file  Occupational History  . Occupation: chaplin  Social Needs  . Financial resource strain: Not on file  . Food insecurity    Worry: Not  on file    Inability: Not on file  . Transportation needs    Medical: Not on file    Non-medical: Not on file  Tobacco Use  . Smoking status: Never Smoker  . Smokeless tobacco: Never Used  Substance and Sexual Activity  . Alcohol use: Not Currently    Alcohol/week: 0.0 standard drinks    Frequency: Never    Comment: 1970 quit  . Drug use: Never  . Sexual activity: Yes  Lifestyle  . Physical activity    Days per week: Not on file    Minutes  per session: Not on file  . Stress: Not on file  Relationships  . Social Herbalist on phone: Not on file    Gets together: Not on file    Attends religious service: Not on file    Active member of club or organization: Not on file    Attends meetings of clubs or organizations: Not on file    Relationship status: Not on file  Other Topics Concern  . Not on file  Social History Narrative  . Not on file     Family History: The patient's family history includes Cancer in his mother.  ROS:   Please see the history of present illness.  Additional pertinent ROS: Constitutional: Negative for chills, fever, night sweats, unintentional weight loss  HENT: Negative for ear pain and hearing loss.   Eyes: Negative for loss of vision and eye pain.  Respiratory: Negative for cough, sputum, wheezing.   Cardiovascular: See HPI. Gastrointestinal: Negative for abdominal pain, melena, and hematochezia.  Genitourinary: Negative for dysuria and hematuria.  Musculoskeletal: Negative for falls and myalgias.  Skin: Negative for itching and rash.  Neurological: Negative for focal weakness, focal sensory changes and loss of consciousness.  Endo/Heme/Allergies: Does not bruise/bleed easily.   EKGs/Labs/Other Studies Reviewed:    The following studies were reviewed today: ETT 2018-09-29 The test was stopped because the patient complained of fatigue.   Blood pressure and heart rate demonstrated a normal response to exercise. Blood pressure demonstrated a normal response to exercise. Overall, the patient's exercise capacity was normal.   85% of maximum heart rate was achieved after 1 minutes. Recovery time: 7 minutes. The patient's response to exercise was adequate for diagnosis.  Response to Stress There was no ST segment deviation noted during stress.  Arrhythmias during stress: none.  Arrhythmias during recovery: none.  There were no significant arrhythmias noted during the test.  ECG was  interpretable and conclusive.  Stress Measurements   Baseline Vitals  Rest HR 94 bpm    Rest BP 125/83 mmHg    Exercise Time  Exercise duration (min) 7 min    Exercise duration (sec) 21 sec    Peak Stress Vitals  Peak HR 164 bpm    Peak BP 176/132 mmHg    Exercise Data  MPHR 154 bpm    Percent HR 106 %    RPE 19     Estimated workload 9 METS         Echo 06/11/18 Study Conclusions  - Left ventricle: The cavity size was normal. Wall thickness was   increased in a pattern of mild LVH. The estimated ejection   fraction was 55%. Wall motion was normal; there were no regional   wall motion abnormalities. Doppler parameters are consistent with   abnormal left ventricular relaxation (grade 1 diastolic   dysfunction). - Aortic valve: There was no stenosis. - Mitral valve: There  was no significant regurgitation. - Right ventricle: The cavity size was mildly dilated. Systolic   function was normal. - Pulmonary arteries: No complete TR doppler jet so unable to   estimate PA systolic pressure. - Inferior vena cava: The vessel was normal in size. The   respirophasic diameter changes were in the normal range (>= 50%),   consistent with normal central venous pressure.  Impressions: - Normal LV size with mild LV hypertrophy. EF 55%. Mildly dilated   RV with normal systolic function. No significant valvular   abnormalities.  Monitor 07/2018: Nearly 17 days of data on [employer] monitor. Heart rate range 67-152 bpm, average 106 bpm. All were sinus rhythm/sinus tachycardia.  No atrial fibrillation, SVT, VT, high degree heart block, or pauses noted. PAC burden <1%, PVC burden <1%. 10 patient triggered events, all sinus tachycardia. HR range 106-131 bpm during events. Symptoms were dizzyness/lightheadedness. No significant abnormalities noted.  EKG:  EKG is personally reviewed.  The ekg 01/01/19 shows sinus tach at 108 bpm.  Recent Labs: 06/10/2018: TSH 0.656 06/11/2018:  Hemoglobin 13.1; Platelets 253 07/29/2018: BUN 16; Creatinine, Ser 1.12; Potassium 4.6; Sodium 138  Recent Lipid Panel    Component Value Date/Time   CHOL  10/04/2008 1045    176        ATP III CLASSIFICATION:  <200     mg/dL   Desirable  200-239  mg/dL   Borderline High  >=240    mg/dL   High          TRIG 176 (H) 10/04/2008 1045   HDL 22 (L) 10/04/2008 1045   CHOLHDL 8.0 10/04/2008 1045   VLDL 35 10/04/2008 1045   LDLCALC (H) 10/04/2008 1045    119        Total Cholesterol/HDL:CHD Risk Coronary Heart Disease Risk Table                     Men   Women  1/2 Average Risk   3.4   3.3  Average Risk       5.0   4.4  2 X Average Risk   9.6   7.1  3 X Average Risk  23.4   11.0        Use the calculated Patient Ratio above and the CHD Risk Table to determine the patient's CHD Risk.        ATP III CLASSIFICATION (LDL):  <100     mg/dL   Optimal  100-129  mg/dL   Near or Above                    Optimal  130-159  mg/dL   Borderline  160-189  mg/dL   High  >190     mg/dL   Very High    Physical Exam:    VS:  BP 134/78   Pulse (!) 109   Ht 5\' 10"  (1.778 m)   Wt 216 lb (98 kg)   BMI 30.99 kg/m   No data found. Wt Readings from Last 3 Encounters:  05/04/19 216 lb (98 kg)  01/01/19 224 lb 9.6 oz (101.9 kg)  11/06/18 217 lb 12.8 oz (98.8 kg)    GEN: Well nourished, well developed in no acute distress HEENT: Normal, moist mucous membranes NECK: No JVD CARDIAC: slightly tachycardic but regular rhythm, normal S1 and S2, no murmurs, rubs, gallops.  VASCULAR: Radial and DP pulses 2+ bilaterally. No carotid bruits RESPIRATORY:  Clear to auscultation without rales, wheezing  or rhonchi  ABDOMEN: Soft, non-tender, non-distended MUSCULOSKELETAL:  Ambulates independently SKIN: Warm and dry, no edema NEUROLOGIC:  Alert and oriented x 3. No focal neuro deficits noted. PSYCHIATRIC:  Normal affect   ASSESSMENT:    1. Precordial pain   2. Cardiac risk counseling    PLAN:     Precordial pain: brief, intermittent, sharp, nonexertional. Prior normal ETT -unlikely cardiac, but if it becomes more frequent or bothersome, we can reassess -counseled on red flag warning signs that need immediate medical attention -reviewed CV risk: distant lipids, I don't have copies of his updated values. On rosuvastatin given his diabetes. Diabetes well controlled, on sitagliptin-metformin. No history of hypertension, on no meds  Prior spells of lightheadedness/dizziness, with shortness of breath: resolved, none in several months. No clear cardiac etiology of this uncovered.  Plan for follow up: I would be happy to see Mr. Davisson back as needed, especially if his precordial pain progresses.   Medication Adjustments/Labs and Tests Ordered: Current medicines are reviewed at length with the patient today.  Concerns regarding medicines are outlined above.  No orders of the defined types were placed in this encounter.  No orders of the defined types were placed in this encounter.   Patient Instructions  Medication Instructions:  Your Physician recommend you continue on your current medication as directed.    If you need a refill on your cardiac medications before your next appointment, please call your pharmacy.   Lab work: None  Testing/Procedures: None  Follow-Up: At [facility], you and your health needs are our priority.  As part of our continuing mission to provide you with exceptional heart care, we have created designated Provider Care Teams.  These Care Teams include your primary Cardiologist (physician) and Advanced Practice Providers (APPs -  Physician Assistants and Nurse Practitioners) who all work together to provide you with the care you need, when you need it. You will need a follow up appointment as needed.  Please call our office 2 months in advance to schedule this appointment.  You may see Dr. Harrell Gave or one of the following Advanced Practice Providers on  your designated Care Team:   Rosaria Ferries, PA-C . Jory Sims, DNP, ANP        Signed, Buford Dresser, MD PhD 05/04/2019 4:25 PM    [facility]

## 2019-05-05 ENCOUNTER — Encounter: Payer: Self-pay | Admitting: Radiation Oncology

## 2019-05-05 NOTE — Progress Notes (Signed)
Circled back this morning to ensure scheduling issues were addressed yesterday. Spoke with Nira Conn, RT on L1 who confirms that after three phone calls between the patient and Aaron Edelman (sim therapist) the patient's scheduling issues were resolved.

## 2019-05-10 DIAGNOSIS — Z23 Encounter for immunization: Secondary | ICD-10-CM | POA: Diagnosis not present

## 2019-05-10 MED FILL — JANUMET 50-1,000 MG TABLET: 50-1000 | 30 days supply | Qty: 60 | Fill #5

## 2019-05-11 DIAGNOSIS — Z510 Encounter for antineoplastic radiation therapy: Secondary | ICD-10-CM | POA: Diagnosis not present

## 2019-05-11 DIAGNOSIS — C61 Malignant neoplasm of prostate: Secondary | ICD-10-CM | POA: Diagnosis not present

## 2019-05-13 ENCOUNTER — Encounter: Payer: Self-pay | Admitting: Medical Oncology

## 2019-05-13 ENCOUNTER — Other Ambulatory Visit: Payer: Self-pay

## 2019-05-13 ENCOUNTER — Ambulatory Visit
Admission: RE | Admit: 2019-05-13 | Discharge: 2019-05-13 | Disposition: A | Discharge status: Home / Self Care | Payer: PPO | Source: Ambulatory Visit | Attending: Radiation Oncology | Admitting: Radiation Oncology

## 2019-05-13 DIAGNOSIS — C61 Malignant neoplasm of prostate: Secondary | ICD-10-CM | POA: Diagnosis not present

## 2019-05-13 DIAGNOSIS — Z510 Encounter for antineoplastic radiation therapy: Secondary | ICD-10-CM | POA: Diagnosis not present

## 2019-05-14 ENCOUNTER — Ambulatory Visit
Admission: RE | Admit: 2019-05-14 | Discharge: 2019-05-14 | Disposition: A | Discharge status: Home / Self Care | Payer: PPO | Source: Ambulatory Visit | Attending: Radiation Oncology | Admitting: Radiation Oncology

## 2019-05-14 ENCOUNTER — Other Ambulatory Visit: Payer: Self-pay

## 2019-05-14 DIAGNOSIS — C61 Malignant neoplasm of prostate: Secondary | ICD-10-CM | POA: Diagnosis not present

## 2019-05-14 DIAGNOSIS — Z510 Encounter for antineoplastic radiation therapy: Secondary | ICD-10-CM | POA: Diagnosis not present

## 2019-05-17 ENCOUNTER — Ambulatory Visit
Admission: RE | Admit: 2019-05-17 | Discharge: 2019-05-17 | Disposition: A | Discharge status: Home / Self Care | Payer: PPO | Source: Ambulatory Visit | Attending: Radiation Oncology | Admitting: Radiation Oncology

## 2019-05-17 DIAGNOSIS — C61 Malignant neoplasm of prostate: Secondary | ICD-10-CM | POA: Diagnosis not present

## 2019-05-17 DIAGNOSIS — Z510 Encounter for antineoplastic radiation therapy: Secondary | ICD-10-CM | POA: Diagnosis not present

## 2019-05-18 ENCOUNTER — Other Ambulatory Visit: Payer: Self-pay

## 2019-05-18 ENCOUNTER — Ambulatory Visit
Admission: RE | Admit: 2019-05-18 | Discharge: 2019-05-18 | Disposition: A | Discharge status: Home / Self Care | Payer: PPO | Source: Ambulatory Visit | Attending: Radiation Oncology | Admitting: Radiation Oncology

## 2019-05-18 DIAGNOSIS — C61 Malignant neoplasm of prostate: Secondary | ICD-10-CM | POA: Diagnosis not present

## 2019-05-18 DIAGNOSIS — Z510 Encounter for antineoplastic radiation therapy: Secondary | ICD-10-CM | POA: Diagnosis not present

## 2019-05-19 ENCOUNTER — Other Ambulatory Visit: Payer: Self-pay

## 2019-05-19 ENCOUNTER — Ambulatory Visit
Admission: RE | Admit: 2019-05-19 | Discharge: 2019-05-19 | Disposition: A | Discharge status: Home / Self Care | Payer: PPO | Source: Ambulatory Visit | Attending: Radiation Oncology | Admitting: Radiation Oncology

## 2019-05-19 ENCOUNTER — Ambulatory Visit: Payer: PPO | Admitting: Nutrition

## 2019-05-19 DIAGNOSIS — C61 Malignant neoplasm of prostate: Secondary | ICD-10-CM | POA: Diagnosis not present

## 2019-05-19 DIAGNOSIS — Z510 Encounter for antineoplastic radiation therapy: Secondary | ICD-10-CM | POA: Diagnosis not present

## 2019-05-19 NOTE — Progress Notes (Signed)
Patient requested nutrition information be emailed to him.  He did not wish to come in for a nutrition consult or for me to contact him by phone at this time. I emailed information on plant-based diet with adequate calories and protein for weight maintenance.  I also provided a fact sheet on diarrhea in case he develops this during treatment.  I provided my contact information for questions.

## 2019-05-20 ENCOUNTER — Ambulatory Visit
Admission: RE | Admit: 2019-05-20 | Discharge: 2019-05-20 | Disposition: A | Discharge status: Home / Self Care | Payer: PPO | Source: Ambulatory Visit | Attending: Radiation Oncology | Admitting: Radiation Oncology

## 2019-05-20 ENCOUNTER — Other Ambulatory Visit: Payer: Self-pay

## 2019-05-20 DIAGNOSIS — Z510 Encounter for antineoplastic radiation therapy: Secondary | ICD-10-CM | POA: Diagnosis not present

## 2019-05-20 DIAGNOSIS — C61 Malignant neoplasm of prostate: Secondary | ICD-10-CM | POA: Diagnosis not present

## 2019-05-21 ENCOUNTER — Other Ambulatory Visit: Payer: Self-pay

## 2019-05-21 ENCOUNTER — Ambulatory Visit
Admission: RE | Admit: 2019-05-21 | Discharge: 2019-05-21 | Disposition: A | Discharge status: Home / Self Care | Payer: PPO | Source: Ambulatory Visit | Attending: Radiation Oncology | Admitting: Radiation Oncology

## 2019-05-21 DIAGNOSIS — Z510 Encounter for antineoplastic radiation therapy: Secondary | ICD-10-CM | POA: Diagnosis not present

## 2019-05-21 DIAGNOSIS — C61 Malignant neoplasm of prostate: Secondary | ICD-10-CM | POA: Diagnosis not present

## 2019-05-24 ENCOUNTER — Other Ambulatory Visit: Payer: Self-pay

## 2019-05-24 ENCOUNTER — Ambulatory Visit
Admission: RE | Admit: 2019-05-24 | Discharge: 2019-05-24 | Disposition: A | Discharge status: Home / Self Care | Payer: PPO | Source: Ambulatory Visit | Attending: Radiation Oncology | Admitting: Radiation Oncology

## 2019-05-24 DIAGNOSIS — Z510 Encounter for antineoplastic radiation therapy: Secondary | ICD-10-CM | POA: Diagnosis not present

## 2019-05-24 DIAGNOSIS — C61 Malignant neoplasm of prostate: Secondary | ICD-10-CM | POA: Diagnosis not present

## 2019-05-25 ENCOUNTER — Ambulatory Visit
Admission: RE | Admit: 2019-05-25 | Discharge: 2019-05-25 | Disposition: A | Discharge status: Home / Self Care | Payer: PPO | Source: Ambulatory Visit | Attending: Radiation Oncology | Admitting: Radiation Oncology

## 2019-05-25 ENCOUNTER — Other Ambulatory Visit: Payer: Self-pay

## 2019-05-25 DIAGNOSIS — Z510 Encounter for antineoplastic radiation therapy: Secondary | ICD-10-CM | POA: Diagnosis not present

## 2019-05-25 DIAGNOSIS — C61 Malignant neoplasm of prostate: Secondary | ICD-10-CM | POA: Diagnosis not present

## 2019-05-26 ENCOUNTER — Ambulatory Visit
Admission: RE | Admit: 2019-05-26 | Discharge: 2019-05-26 | Disposition: A | Discharge status: Home / Self Care | Payer: PPO | Source: Ambulatory Visit | Attending: Radiation Oncology | Admitting: Radiation Oncology

## 2019-05-26 ENCOUNTER — Other Ambulatory Visit: Payer: Self-pay

## 2019-05-26 DIAGNOSIS — Z510 Encounter for antineoplastic radiation therapy: Secondary | ICD-10-CM | POA: Diagnosis not present

## 2019-05-26 DIAGNOSIS — C61 Malignant neoplasm of prostate: Secondary | ICD-10-CM | POA: Diagnosis not present

## 2019-05-27 ENCOUNTER — Ambulatory Visit
Admission: RE | Admit: 2019-05-27 | Discharge: 2019-05-27 | Disposition: A | Discharge status: Home / Self Care | Payer: PPO | Source: Ambulatory Visit | Attending: Radiation Oncology | Admitting: Radiation Oncology

## 2019-05-27 DIAGNOSIS — C61 Malignant neoplasm of prostate: Secondary | ICD-10-CM | POA: Diagnosis not present

## 2019-05-27 DIAGNOSIS — Z510 Encounter for antineoplastic radiation therapy: Secondary | ICD-10-CM | POA: Insufficient documentation

## 2019-05-28 ENCOUNTER — Ambulatory Visit
Admission: RE | Admit: 2019-05-28 | Discharge: 2019-05-28 | Disposition: A | Discharge status: Home / Self Care | Payer: PPO | Source: Ambulatory Visit | Attending: Radiation Oncology | Admitting: Radiation Oncology

## 2019-05-28 ENCOUNTER — Other Ambulatory Visit: Payer: Self-pay

## 2019-05-28 DIAGNOSIS — Z510 Encounter for antineoplastic radiation therapy: Secondary | ICD-10-CM | POA: Diagnosis not present

## 2019-05-28 DIAGNOSIS — C61 Malignant neoplasm of prostate: Secondary | ICD-10-CM | POA: Diagnosis not present

## 2019-05-31 ENCOUNTER — Ambulatory Visit
Admission: RE | Admit: 2019-05-31 | Discharge: 2019-05-31 | Disposition: A | Discharge status: Home / Self Care | Payer: PPO | Source: Ambulatory Visit | Attending: Radiation Oncology | Admitting: Radiation Oncology

## 2019-05-31 ENCOUNTER — Other Ambulatory Visit: Payer: Self-pay

## 2019-05-31 DIAGNOSIS — C61 Malignant neoplasm of prostate: Secondary | ICD-10-CM | POA: Diagnosis not present

## 2019-05-31 DIAGNOSIS — Z510 Encounter for antineoplastic radiation therapy: Secondary | ICD-10-CM | POA: Diagnosis not present

## 2019-06-01 ENCOUNTER — Ambulatory Visit
Admission: RE | Admit: 2019-06-01 | Discharge: 2019-06-01 | Disposition: A | Discharge status: Home / Self Care | Payer: PPO | Source: Ambulatory Visit | Attending: Radiation Oncology | Admitting: Radiation Oncology

## 2019-06-01 ENCOUNTER — Other Ambulatory Visit: Payer: Self-pay

## 2019-06-01 DIAGNOSIS — C61 Malignant neoplasm of prostate: Secondary | ICD-10-CM | POA: Diagnosis not present

## 2019-06-01 DIAGNOSIS — Z510 Encounter for antineoplastic radiation therapy: Secondary | ICD-10-CM | POA: Diagnosis not present

## 2019-06-02 ENCOUNTER — Ambulatory Visit
Admission: RE | Admit: 2019-06-02 | Discharge: 2019-06-02 | Disposition: A | Discharge status: Home / Self Care | Payer: PPO | Source: Ambulatory Visit | Attending: Radiation Oncology | Admitting: Radiation Oncology

## 2019-06-02 ENCOUNTER — Other Ambulatory Visit: Payer: Self-pay

## 2019-06-02 DIAGNOSIS — Z510 Encounter for antineoplastic radiation therapy: Secondary | ICD-10-CM | POA: Diagnosis not present

## 2019-06-02 DIAGNOSIS — C61 Malignant neoplasm of prostate: Secondary | ICD-10-CM | POA: Diagnosis not present

## 2019-06-03 ENCOUNTER — Other Ambulatory Visit: Payer: Self-pay

## 2019-06-03 ENCOUNTER — Ambulatory Visit
Admission: RE | Admit: 2019-06-03 | Discharge: 2019-06-03 | Disposition: A | Discharge status: Home / Self Care | Payer: PPO | Source: Ambulatory Visit | Attending: Radiation Oncology | Admitting: Radiation Oncology

## 2019-06-03 DIAGNOSIS — Z510 Encounter for antineoplastic radiation therapy: Secondary | ICD-10-CM | POA: Diagnosis not present

## 2019-06-03 DIAGNOSIS — C61 Malignant neoplasm of prostate: Secondary | ICD-10-CM | POA: Diagnosis not present

## 2019-06-04 ENCOUNTER — Other Ambulatory Visit: Payer: Self-pay

## 2019-06-04 ENCOUNTER — Ambulatory Visit
Admission: RE | Admit: 2019-06-04 | Discharge: 2019-06-04 | Disposition: A | Discharge status: Home / Self Care | Payer: PPO | Source: Ambulatory Visit | Attending: Radiation Oncology | Admitting: Radiation Oncology

## 2019-06-04 DIAGNOSIS — Z510 Encounter for antineoplastic radiation therapy: Secondary | ICD-10-CM | POA: Diagnosis not present

## 2019-06-04 DIAGNOSIS — C61 Malignant neoplasm of prostate: Secondary | ICD-10-CM | POA: Diagnosis not present

## 2019-06-07 ENCOUNTER — Other Ambulatory Visit: Payer: Self-pay

## 2019-06-07 ENCOUNTER — Ambulatory Visit
Admission: RE | Admit: 2019-06-07 | Discharge: 2019-06-07 | Disposition: A | Discharge status: Home / Self Care | Payer: PPO | Source: Ambulatory Visit | Attending: Radiation Oncology | Admitting: Radiation Oncology

## 2019-06-07 DIAGNOSIS — C61 Malignant neoplasm of prostate: Secondary | ICD-10-CM | POA: Diagnosis not present

## 2019-06-07 DIAGNOSIS — H43813 Vitreous degeneration, bilateral: Secondary | ICD-10-CM | POA: Diagnosis not present

## 2019-06-07 DIAGNOSIS — H2513 Age-related nuclear cataract, bilateral: Secondary | ICD-10-CM | POA: Diagnosis not present

## 2019-06-07 DIAGNOSIS — H35412 Lattice degeneration of retina, left eye: Secondary | ICD-10-CM | POA: Diagnosis not present

## 2019-06-07 DIAGNOSIS — E119 Type 2 diabetes mellitus without complications: Secondary | ICD-10-CM | POA: Diagnosis not present

## 2019-06-07 DIAGNOSIS — Z510 Encounter for antineoplastic radiation therapy: Secondary | ICD-10-CM | POA: Diagnosis not present

## 2019-06-08 ENCOUNTER — Other Ambulatory Visit: Payer: Self-pay

## 2019-06-08 ENCOUNTER — Ambulatory Visit
Admission: RE | Admit: 2019-06-08 | Discharge: 2019-06-08 | Disposition: A | Discharge status: Home / Self Care | Payer: PPO | Source: Ambulatory Visit | Attending: Radiation Oncology | Admitting: Radiation Oncology

## 2019-06-08 DIAGNOSIS — Z510 Encounter for antineoplastic radiation therapy: Secondary | ICD-10-CM | POA: Diagnosis not present

## 2019-06-08 DIAGNOSIS — C61 Malignant neoplasm of prostate: Secondary | ICD-10-CM | POA: Diagnosis not present

## 2019-06-09 ENCOUNTER — Ambulatory Visit
Admission: RE | Admit: 2019-06-09 | Discharge: 2019-06-09 | Disposition: A | Discharge status: Home / Self Care | Payer: PPO | Source: Ambulatory Visit | Attending: Radiation Oncology | Admitting: Radiation Oncology

## 2019-06-09 ENCOUNTER — Other Ambulatory Visit: Payer: Self-pay

## 2019-06-09 DIAGNOSIS — Z510 Encounter for antineoplastic radiation therapy: Secondary | ICD-10-CM | POA: Diagnosis not present

## 2019-06-09 DIAGNOSIS — C61 Malignant neoplasm of prostate: Secondary | ICD-10-CM | POA: Diagnosis not present

## 2019-06-10 ENCOUNTER — Ambulatory Visit
Admission: RE | Admit: 2019-06-10 | Discharge: 2019-06-10 | Disposition: A | Discharge status: Home / Self Care | Payer: PPO | Source: Ambulatory Visit | Attending: Radiation Oncology | Admitting: Radiation Oncology

## 2019-06-10 DIAGNOSIS — C61 Malignant neoplasm of prostate: Secondary | ICD-10-CM | POA: Diagnosis not present

## 2019-06-10 DIAGNOSIS — Z510 Encounter for antineoplastic radiation therapy: Secondary | ICD-10-CM | POA: Diagnosis not present

## 2019-06-11 ENCOUNTER — Other Ambulatory Visit: Payer: Self-pay

## 2019-06-11 ENCOUNTER — Ambulatory Visit
Admission: RE | Admit: 2019-06-11 | Discharge: 2019-06-11 | Disposition: A | Discharge status: Home / Self Care | Payer: PPO | Source: Ambulatory Visit | Attending: Radiation Oncology | Admitting: Radiation Oncology

## 2019-06-11 DIAGNOSIS — C61 Malignant neoplasm of prostate: Secondary | ICD-10-CM | POA: Diagnosis not present

## 2019-06-11 DIAGNOSIS — Z510 Encounter for antineoplastic radiation therapy: Secondary | ICD-10-CM | POA: Diagnosis not present

## 2019-06-14 ENCOUNTER — Ambulatory Visit
Admission: RE | Admit: 2019-06-14 | Discharge: 2019-06-14 | Disposition: A | Discharge status: Home / Self Care | Payer: PPO | Source: Ambulatory Visit | Attending: Radiation Oncology | Admitting: Radiation Oncology

## 2019-06-14 ENCOUNTER — Other Ambulatory Visit: Payer: Self-pay

## 2019-06-14 DIAGNOSIS — C61 Malignant neoplasm of prostate: Secondary | ICD-10-CM | POA: Diagnosis not present

## 2019-06-14 DIAGNOSIS — Z510 Encounter for antineoplastic radiation therapy: Secondary | ICD-10-CM | POA: Diagnosis not present

## 2019-06-15 ENCOUNTER — Ambulatory Visit
Admission: RE | Admit: 2019-06-15 | Discharge: 2019-06-15 | Disposition: A | Discharge status: Home / Self Care | Payer: PPO | Source: Ambulatory Visit | Attending: Radiation Oncology | Admitting: Radiation Oncology

## 2019-06-15 ENCOUNTER — Other Ambulatory Visit: Payer: Self-pay

## 2019-06-15 DIAGNOSIS — Z510 Encounter for antineoplastic radiation therapy: Secondary | ICD-10-CM | POA: Diagnosis not present

## 2019-06-15 DIAGNOSIS — C61 Malignant neoplasm of prostate: Secondary | ICD-10-CM | POA: Diagnosis not present

## 2019-06-16 ENCOUNTER — Ambulatory Visit
Admission: RE | Admit: 2019-06-16 | Discharge: 2019-06-16 | Disposition: A | Discharge status: Home / Self Care | Payer: PPO | Source: Ambulatory Visit | Attending: Radiation Oncology | Admitting: Radiation Oncology

## 2019-06-16 ENCOUNTER — Other Ambulatory Visit: Payer: Self-pay

## 2019-06-16 DIAGNOSIS — C61 Malignant neoplasm of prostate: Secondary | ICD-10-CM | POA: Diagnosis not present

## 2019-06-16 DIAGNOSIS — Z510 Encounter for antineoplastic radiation therapy: Secondary | ICD-10-CM | POA: Diagnosis not present

## 2019-06-16 NOTE — Progress Notes (Signed)
Received patient in the clinic today following his 18 th radiation treatment to his prostate bed. Patient reports new onset diarrhea with urgency. Encouraged patient to begin OTC Imodium AD. Reviewed with patient recommended use of Imodium AD in this setting. Also, provided patient with low residue diet handout and reviewed information in great detail. Encouraged patient to care for his bottom during these times of diarrhea possibly using baby wipes with aloe to avoid rectal irritation.  Answered all patient questions to the best of my ability. Patient verbalized understanding of all reviewed.

## 2019-06-17 ENCOUNTER — Ambulatory Visit
Admission: RE | Admit: 2019-06-17 | Discharge: 2019-06-17 | Disposition: A | Discharge status: Home / Self Care | Payer: PPO | Source: Ambulatory Visit | Attending: Radiation Oncology | Admitting: Radiation Oncology

## 2019-06-17 ENCOUNTER — Other Ambulatory Visit: Payer: Self-pay

## 2019-06-17 DIAGNOSIS — C61 Malignant neoplasm of prostate: Secondary | ICD-10-CM | POA: Diagnosis not present

## 2019-06-17 DIAGNOSIS — Z510 Encounter for antineoplastic radiation therapy: Secondary | ICD-10-CM | POA: Diagnosis not present

## 2019-06-18 ENCOUNTER — Other Ambulatory Visit: Payer: Self-pay

## 2019-06-18 ENCOUNTER — Ambulatory Visit
Admission: RE | Admit: 2019-06-18 | Discharge: 2019-06-18 | Disposition: A | Discharge status: Home / Self Care | Payer: PPO | Source: Ambulatory Visit | Attending: Radiation Oncology | Admitting: Radiation Oncology

## 2019-06-18 DIAGNOSIS — C61 Malignant neoplasm of prostate: Secondary | ICD-10-CM | POA: Diagnosis not present

## 2019-06-18 DIAGNOSIS — Z510 Encounter for antineoplastic radiation therapy: Secondary | ICD-10-CM | POA: Diagnosis not present

## 2019-06-21 ENCOUNTER — Other Ambulatory Visit: Payer: Self-pay

## 2019-06-21 ENCOUNTER — Ambulatory Visit
Admission: RE | Admit: 2019-06-21 | Discharge: 2019-06-21 | Disposition: A | Discharge status: Home / Self Care | Payer: PPO | Source: Ambulatory Visit | Attending: Radiation Oncology | Admitting: Radiation Oncology

## 2019-06-21 DIAGNOSIS — Z510 Encounter for antineoplastic radiation therapy: Secondary | ICD-10-CM | POA: Diagnosis not present

## 2019-06-21 DIAGNOSIS — C61 Malignant neoplasm of prostate: Secondary | ICD-10-CM | POA: Diagnosis not present

## 2019-06-22 ENCOUNTER — Other Ambulatory Visit: Payer: Self-pay

## 2019-06-22 ENCOUNTER — Ambulatory Visit
Admission: RE | Admit: 2019-06-22 | Discharge: 2019-06-22 | Disposition: A | Discharge status: Home / Self Care | Payer: PPO | Source: Ambulatory Visit | Attending: Radiation Oncology | Admitting: Radiation Oncology

## 2019-06-22 DIAGNOSIS — Z510 Encounter for antineoplastic radiation therapy: Secondary | ICD-10-CM | POA: Diagnosis not present

## 2019-06-22 DIAGNOSIS — C61 Malignant neoplasm of prostate: Secondary | ICD-10-CM | POA: Diagnosis not present

## 2019-06-23 ENCOUNTER — Other Ambulatory Visit: Payer: Self-pay

## 2019-06-23 ENCOUNTER — Ambulatory Visit
Admission: RE | Admit: 2019-06-23 | Discharge: 2019-06-23 | Disposition: A | Discharge status: Home / Self Care | Payer: PPO | Source: Ambulatory Visit | Attending: Radiation Oncology | Admitting: Radiation Oncology

## 2019-06-23 DIAGNOSIS — Z510 Encounter for antineoplastic radiation therapy: Secondary | ICD-10-CM | POA: Diagnosis not present

## 2019-06-23 DIAGNOSIS — C61 Malignant neoplasm of prostate: Secondary | ICD-10-CM | POA: Diagnosis not present

## 2019-06-24 ENCOUNTER — Other Ambulatory Visit: Payer: Self-pay

## 2019-06-24 ENCOUNTER — Ambulatory Visit
Admission: RE | Admit: 2019-06-24 | Discharge: 2019-06-24 | Disposition: A | Discharge status: Home / Self Care | Payer: PPO | Source: Ambulatory Visit | Attending: Radiation Oncology | Admitting: Radiation Oncology

## 2019-06-24 DIAGNOSIS — C61 Malignant neoplasm of prostate: Secondary | ICD-10-CM | POA: Diagnosis not present

## 2019-06-24 DIAGNOSIS — Z510 Encounter for antineoplastic radiation therapy: Secondary | ICD-10-CM | POA: Diagnosis not present

## 2019-06-25 ENCOUNTER — Other Ambulatory Visit: Payer: Self-pay

## 2019-06-25 ENCOUNTER — Ambulatory Visit
Admission: RE | Admit: 2019-06-25 | Discharge: 2019-06-25 | Disposition: A | Discharge status: Home / Self Care | Payer: PPO | Source: Ambulatory Visit | Attending: Radiation Oncology | Admitting: Radiation Oncology

## 2019-06-25 DIAGNOSIS — C61 Malignant neoplasm of prostate: Secondary | ICD-10-CM | POA: Diagnosis not present

## 2019-06-25 DIAGNOSIS — Z510 Encounter for antineoplastic radiation therapy: Secondary | ICD-10-CM | POA: Diagnosis not present

## 2019-06-28 ENCOUNTER — Other Ambulatory Visit: Payer: Self-pay

## 2019-06-28 ENCOUNTER — Ambulatory Visit
Admission: RE | Admit: 2019-06-28 | Discharge: 2019-06-28 | Disposition: A | Discharge status: Home / Self Care | Payer: PPO | Source: Ambulatory Visit | Attending: Radiation Oncology | Admitting: Radiation Oncology

## 2019-06-28 DIAGNOSIS — Z510 Encounter for antineoplastic radiation therapy: Secondary | ICD-10-CM | POA: Diagnosis not present

## 2019-06-28 DIAGNOSIS — C61 Malignant neoplasm of prostate: Secondary | ICD-10-CM | POA: Insufficient documentation

## 2019-06-29 ENCOUNTER — Other Ambulatory Visit: Payer: Self-pay

## 2019-06-29 ENCOUNTER — Ambulatory Visit
Admission: RE | Admit: 2019-06-29 | Discharge: 2019-06-29 | Disposition: A | Discharge status: Home / Self Care | Payer: PPO | Source: Ambulatory Visit | Attending: Radiation Oncology | Admitting: Radiation Oncology

## 2019-06-29 DIAGNOSIS — Z510 Encounter for antineoplastic radiation therapy: Secondary | ICD-10-CM | POA: Diagnosis not present

## 2019-06-29 DIAGNOSIS — C61 Malignant neoplasm of prostate: Secondary | ICD-10-CM | POA: Diagnosis not present

## 2019-06-29 MED FILL — JANUMET 50-1,000 MG TABLET: 50-1000 | 30 days supply | Qty: 60 | Fill #6

## 2019-06-30 ENCOUNTER — Other Ambulatory Visit: Payer: Self-pay

## 2019-06-30 ENCOUNTER — Ambulatory Visit
Admission: RE | Admit: 2019-06-30 | Discharge: 2019-06-30 | Disposition: A | Discharge status: Home / Self Care | Payer: PPO | Source: Ambulatory Visit | Attending: Radiation Oncology | Admitting: Radiation Oncology

## 2019-06-30 DIAGNOSIS — C61 Malignant neoplasm of prostate: Secondary | ICD-10-CM | POA: Diagnosis not present

## 2019-06-30 DIAGNOSIS — Z510 Encounter for antineoplastic radiation therapy: Secondary | ICD-10-CM | POA: Diagnosis not present

## 2019-07-01 ENCOUNTER — Ambulatory Visit
Admission: RE | Admit: 2019-07-01 | Discharge: 2019-07-01 | Disposition: A | Discharge status: Home / Self Care | Payer: PPO | Source: Ambulatory Visit | Attending: Radiation Oncology | Admitting: Radiation Oncology

## 2019-07-01 ENCOUNTER — Other Ambulatory Visit: Payer: Self-pay

## 2019-07-01 DIAGNOSIS — C61 Malignant neoplasm of prostate: Secondary | ICD-10-CM | POA: Diagnosis not present

## 2019-07-01 DIAGNOSIS — Z510 Encounter for antineoplastic radiation therapy: Secondary | ICD-10-CM | POA: Diagnosis not present

## 2019-07-02 ENCOUNTER — Ambulatory Visit
Admission: RE | Admit: 2019-07-02 | Discharge: 2019-07-02 | Disposition: A | Discharge status: Home / Self Care | Payer: PPO | Source: Ambulatory Visit | Attending: Radiation Oncology | Admitting: Radiation Oncology

## 2019-07-02 ENCOUNTER — Other Ambulatory Visit: Payer: Self-pay

## 2019-07-02 DIAGNOSIS — Z510 Encounter for antineoplastic radiation therapy: Secondary | ICD-10-CM | POA: Diagnosis not present

## 2019-07-02 DIAGNOSIS — C61 Malignant neoplasm of prostate: Secondary | ICD-10-CM | POA: Diagnosis not present

## 2019-07-05 ENCOUNTER — Other Ambulatory Visit: Payer: Self-pay

## 2019-07-05 ENCOUNTER — Encounter: Payer: Self-pay | Admitting: Radiation Oncology

## 2019-07-05 ENCOUNTER — Ambulatory Visit
Admission: RE | Admit: 2019-07-05 | Discharge: 2019-07-05 | Disposition: A | Discharge status: Home / Self Care | Payer: PPO | Source: Ambulatory Visit | Attending: Radiation Oncology | Admitting: Radiation Oncology

## 2019-07-05 DIAGNOSIS — Z510 Encounter for antineoplastic radiation therapy: Secondary | ICD-10-CM | POA: Diagnosis not present

## 2019-07-05 DIAGNOSIS — C61 Malignant neoplasm of prostate: Secondary | ICD-10-CM | POA: Diagnosis not present

## 2019-07-27 DIAGNOSIS — R7989 Other specified abnormal findings of blood chemistry: Secondary | ICD-10-CM | POA: Diagnosis not present

## 2019-07-27 DIAGNOSIS — C61 Malignant neoplasm of prostate: Secondary | ICD-10-CM | POA: Diagnosis not present

## 2019-07-27 DIAGNOSIS — R42 Dizziness and giddiness: Secondary | ICD-10-CM | POA: Diagnosis not present

## 2019-07-27 DIAGNOSIS — R5383 Other fatigue: Secondary | ICD-10-CM | POA: Diagnosis not present

## 2019-08-04 ENCOUNTER — Telehealth: Payer: Self-pay

## 2019-08-04 NOTE — Telephone Encounter (Signed)
Contacted pt to convey that tomorrow's f/u appt with Ashlyn will be via telephone to limit potential exposure. Pt verbalized that he had called Dr. Tammi Klippel directly to inquire about tomorrow's visit. Conveyed to pt that appt is with Dr. Johny Shears PA, Ashlyn, and that the CC as a whole was trying to limit the patients' exposure to Covid. Pt verbalized understanding and knows that he will be able to discuss all of his concerns during his telephone f/u appt. Loma Sousa, RN BSN

## 2019-08-05 ENCOUNTER — Other Ambulatory Visit: Payer: Self-pay

## 2019-08-05 ENCOUNTER — Ambulatory Visit
Admission: RE | Admit: 2019-08-05 | Discharge: 2019-08-05 | Disposition: A | Discharge status: Home / Self Care | Payer: PPO | Source: Ambulatory Visit | Attending: Urology | Admitting: Urology

## 2019-08-05 DIAGNOSIS — C61 Malignant neoplasm of prostate: Secondary | ICD-10-CM

## 2019-08-05 NOTE — Progress Notes (Signed)
Radiation Oncology         [phone] ________________________________  Name: Nathan [name] MRN: [id number]  Date: 08/05/2019  DOB: 1951/02/28  Post Treatment Note  CC: Kristopher Glee., MD  Raynelle Bring, MD  Diagnosis:   68 y.o. 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  Interval Since Last Radiation:  4.5 weeks  05/13/19 - 07/05/19:  The prostate fossa and pelvic lymph nodes were treated to 45 Gy in 25 fractions of 1.8 Gy, followed by a boost to the prostate bed to a total dose of 68.4 Gy with 13 additional fractions of 1.8 Gy, concurrent with ST-ADT.  Narrative:  I spoke with the patient to conduct his routine scheduled 1 month follow up visit via telephone to spare the patient unnecessary potential exposure in the healthcare setting during the current COVID-19 pandemic.  The patient was notified in advance and gave permission to proceed with this visit format. He tolerated radiation treatments relatively well with only minor urinary irritation and modest fatigue. He did experience increased frequency, urgency, nocturia and occasional, small volume UUI as well as occasional increased frequency of soft/loose BMs.                            On review of systems, the patient states that he is doing very well overall.  He has noticed gradual improvement in his LUTS but continues with mild increased frequency and urgency but overall feels that he is emptying his bladder well on voiding.  He still occasionally experiences some bowel urgency but no out right diffuse diarrhea.  He specifically denies dysuria, gross hematuria, straining to void, incomplete bladder emptying or incontinence.  He has noticed some decreased appetite since starting ADT but has been maintaining his weight.  He completed his 6 months of ADT in 05/2019 and PSA remained undetectable so he  has remained off ADT since that time and is hoping that he might start putting some weight back on in the near future.  He continues with mild fatigue but this is manageable and he has been able to continue with his normal day-to-day activities and stay active.  Overall, he feels that he tolerated his ADT treatment well and he is quite pleased with his progress to date.  ALLERGIES:  has No Known Allergies.  Meds: Current Outpatient Medications  Medication Sig Dispense Refill  . calcium-vitamin D (OSCAL WITH D) 500-200 MG-UNIT tablet Take 1 tablet by mouth.    . rosuvastatin (CRESTOR) 20 MG tablet Take 20 mg by mouth at bedtime.     . sitaGLIPtin-metformin (JANUMET) 50-1000 MG tablet Take 1 tablet by mouth 2 (two) times daily with a meal.     No current facility-administered medications for this encounter.    Physical Findings:  vitals were not taken for this visit.   /Unable to assess due to telephone follow-up visit format.  Lab Findings: Lab Results  Component Value Date   WBC 7.0 06/11/2018   HGB 13.1 06/11/2018   HCT 42.7 06/11/2018   MCV 85.2 06/11/2018   PLT 253 06/11/2018     Radiographic Findings: No results found.  Impression/Plan: 1. 68 y.o. 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 He will continue to follow up with urology for ongoing PSA determinations and has an appointment scheduled with Dr. Alinda Money on 10/27/2019. He has completed a 6 month course of ADT under the care and direction of Dr. Alinda Money with his most recent PSA on 06/25/19 remaining undetectable.  The plan is to continue to monitor the PSA and only resume ADT if/when necessary.  He understands what to expect with regards to PSA monitoring going forward. I will look forward to following his response to treatment via correspondence with urology, and would be happy to continue to  participate in his care if clinically indicated. I talked to the patient about what to expect in the future, including his risk for erectile dysfunction and rectal bleeding. I encouraged him to call or return to the office if he has any questions regarding his previous radiation or possible radiation side effects. He was comfortable with this plan and will follow up as needed.    Nicholos Johns, PA-C

## 2019-08-05 NOTE — Patient Instructions (Signed)
Coronavirus (COVID-19) Are you at risk?  Are you at risk for the Coronavirus (COVID-19)?  To be considered HIGH RISK for Coronavirus (COVID-19), you have to meet the following criteria:  . Traveled to [place], [place], [place], [place] or [place]; or in the [place] to [place], [place], [place], or [place]; and have fever, cough, and shortness of breath within the last 2 weeks of travel OR . Been in close contact with a person diagnosed with COVID-19 within the last 2 weeks and have fever, cough, and shortness of breath . IF YOU DO NOT MEET THESE CRITERIA, YOU ARE CONSIDERED LOW RISK FOR COVID-19.  What to do if you are HIGH RISK for COVID-19?  . If you are having a medical emergency, call 911. . Seek medical care right away. Before you go to a doctor's office, urgent care or emergency department, call ahead and tell them about your recent travel, contact with someone diagnosed with COVID-19, and your symptoms. You should receive instructions from your physician's office regarding next steps of care.  . When you arrive at healthcare provider, tell the healthcare staff immediately you have returned from visiting [place], [place], [place], [place] or [place]; or traveled in the [place] to [place], [place], [place], or [place]; in the last two weeks or you have been in close contact with a person diagnosed with COVID-19 in the last 2 weeks.   . Tell the health care staff about your symptoms: fever, cough and shortness of breath. . After you have been seen by a medical provider, you will be either: o Tested for (COVID-19) and discharged home on quarantine except to seek medical care if symptoms worsen, and asked to  - Stay home and avoid contact with others until you get your results (4-5 days)  - Avoid travel on public transportation if possible (such as bus, train, or airplane) or o Sent to the Emergency Department by EMS for evaluation, COVID-19 testing, and possible  admission depending on your condition and test results.  What to do if you are LOW RISK for COVID-19?  Reduce your risk of any infection by using the same precautions used for avoiding the common cold or flu:  . Wash your hands often with soap and warm water for at least 20 seconds.  If soap and water are not readily available, use an alcohol-based hand sanitizer with at least 60% alcohol.  . If coughing or sneezing, cover your mouth and nose by coughing or sneezing into the elbow areas of your shirt or coat, into a tissue or into your sleeve (not your hands). . Avoid shaking hands with others and consider head nods or verbal greetings only. . Avoid touching your eyes, nose, or mouth with unwashed hands.  . Avoid close contact with people who are sick. . Avoid places or events with large numbers of people in one location, like concerts or sporting events. . Carefully consider travel plans you have or are making. . If you are planning any travel outside or inside the [place], visit the CDC's Travelers' Health webpage for the latest health notices. . If you have some symptoms but not all symptoms, continue to monitor at home and seek medical attention if your symptoms worsen. . If you are having a medical emergency, call 911.   ADDITIONAL HEALTHCARE OPTIONS FOR PATIENTS  [facility] / e-Visit: [email or web address]         [facility]: [phone]  [facility]: [phone]                   [facility]: [phone]   

## 2019-08-05 NOTE — Progress Notes (Signed)
Spoke with patient he is doing well denies any issues or complaints of. Only complaint is occasional leakage if he waits too long or if he strains.

## 2019-08-09 MED FILL — JANUMET 50-1,000 MG TABLET: 50-1000 | 30 days supply | Qty: 60 | Fill #7

## 2019-08-15 NOTE — Progress Notes (Signed)
  Radiation Oncology         [phone] ________________________________  Name: Nathan [name] MRN: [id number]  Date: 07/05/2019  DOB: 07-11-1951  End of Treatment Note  Diagnosis:   68 y.o. 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     Indication for treatment:  Curative, Definitive Radiotherapy       Radiation treatment dates:   05/13/19-07/05/19  Site/dose:  1. The prostate fossa and pelvic lymph nodes were initially treated to 45 Gy in 25 fractions of 1.8 Gy  2. The prostate fossa only was boosted to 68.4 Gy with 13 additional fractions of 1.8 Gy   Beams/energy:  1. The prostate fossa  and pelvic lymph nodes were initially treated using VMAT intensity modulated radiotherapy delivering 6 megavolt photons. Image guidance was performed with CB-CT studies prior to each fraction. He was immobilized with a body fix lower extremity mold.  2. The prostate fossa only was boosted using VMAT intensity modulated radiotherapy delivering 6 megavolt photons. Image guidance was performed with CB-CT studies prior to each fraction. He was immobilized with a body fix lower extremity mold.  Narrative: The patient tolerated radiation treatment relatively well.   The patient experienced some minor urinary irritation and modest fatigue.    Plan: The patient has completed radiation treatment. He will return to radiation oncology clinic for routine followup in one month. I advised him to call or return sooner if he has any questions or concerns related to his recovery or treatment. ________________________________  Sheral Apley. Tammi Klippel, M.D.

## 2019-08-19 MED FILL — JANUMET 50-1,000 MG TABLET: 50-1000 | 30 days supply | Qty: 60 | Fill #7

## 2019-08-23 DIAGNOSIS — E785 Hyperlipidemia, unspecified: Secondary | ICD-10-CM | POA: Diagnosis not present

## 2019-08-23 DIAGNOSIS — E119 Type 2 diabetes mellitus without complications: Secondary | ICD-10-CM | POA: Diagnosis not present

## 2019-08-23 DIAGNOSIS — R748 Abnormal levels of other serum enzymes: Secondary | ICD-10-CM | POA: Diagnosis not present

## 2019-08-23 DIAGNOSIS — F411 Generalized anxiety disorder: Secondary | ICD-10-CM | POA: Diagnosis not present

## 2019-08-23 DIAGNOSIS — Z23 Encounter for immunization: Secondary | ICD-10-CM | POA: Diagnosis not present

## 2019-08-23 MED FILL — ROSUVASTATIN CALCIUM 20 MG: 20 | 90 days supply | Qty: 90 | Fill #0

## 2019-09-23 DIAGNOSIS — R748 Abnormal levels of other serum enzymes: Secondary | ICD-10-CM | POA: Diagnosis not present

## 2019-09-23 DIAGNOSIS — E785 Hyperlipidemia, unspecified: Secondary | ICD-10-CM | POA: Diagnosis not present

## 2019-09-27 DIAGNOSIS — Z7984 Long term (current) use of oral hypoglycemic drugs: Secondary | ICD-10-CM | POA: Diagnosis not present

## 2019-09-27 DIAGNOSIS — E119 Type 2 diabetes mellitus without complications: Secondary | ICD-10-CM | POA: Diagnosis not present

## 2019-09-27 DIAGNOSIS — Z0001 Encounter for general adult medical examination with abnormal findings: Secondary | ICD-10-CM | POA: Diagnosis not present

## 2019-09-27 MED FILL — JANUMET 50-1,000 MG TABLET: 50-1000 | 30 days supply | Qty: 60 | Fill #8

## 2019-09-30 DIAGNOSIS — E559 Vitamin D deficiency, unspecified: Secondary | ICD-10-CM | POA: Diagnosis not present

## 2019-09-30 DIAGNOSIS — E785 Hyperlipidemia, unspecified: Secondary | ICD-10-CM | POA: Diagnosis not present

## 2019-10-22 DIAGNOSIS — E559 Vitamin D deficiency, unspecified: Secondary | ICD-10-CM | POA: Diagnosis not present

## 2019-10-22 DIAGNOSIS — R748 Abnormal levels of other serum enzymes: Secondary | ICD-10-CM | POA: Diagnosis not present

## 2019-10-22 DIAGNOSIS — E119 Type 2 diabetes mellitus without complications: Secondary | ICD-10-CM | POA: Diagnosis not present

## 2019-10-22 DIAGNOSIS — E785 Hyperlipidemia, unspecified: Secondary | ICD-10-CM | POA: Diagnosis not present

## 2019-10-27 DIAGNOSIS — C61 Malignant neoplasm of prostate: Secondary | ICD-10-CM | POA: Diagnosis not present

## 2019-10-28 ENCOUNTER — Telehealth: Payer: Self-pay | Admitting: Neurology

## 2019-10-28 NOTE — Telephone Encounter (Signed)
Spoke with pt. He states this is the same thing as he dealt with before. He can be talking, get really focused, breathing changes, voice changes. He tries to keep himself from passing out. He wanted to know if he could be checked again, perhaps have another extended EEG. He will respect what Dr. Jaynee Eagles advises. Will leave appt as is until we hear from her.  He verbalized appreciation for the call back.

## 2019-10-28 NOTE — Telephone Encounter (Signed)
PT states he is having some episodes from his last OV and states he'll be sitting or driving and "will go somewhere" and states he feels like a rush to his head. States he has to fight to keep himself from falling out   Scheduled next apt with MD per pt

## 2019-10-29 NOTE — Telephone Encounter (Signed)
We already did a 72 hour eeg and he had 8 episodes without any brain correlation, another eeg would not be valuable. 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 If Amy is ok with seeing him, he can follow up once and get her opinion (panic attacks I suspect) and make sure there is no other new symptoms with the episodes.

## 2019-11-01 NOTE — Telephone Encounter (Signed)
Spoke with pt. He could not come to Amy's next avail appt. Her next avail is April. Per Dr. Jaynee Eagles, schedule with Jinny Blossom NP. Pt scheduled tomorrow AM @ 9:00, arrival 15-30 minutes early. Pt verbalized appreciation.

## 2019-11-02 ENCOUNTER — Other Ambulatory Visit: Payer: Self-pay

## 2019-11-02 ENCOUNTER — Encounter: Payer: Self-pay | Admitting: Adult Health

## 2019-11-02 ENCOUNTER — Ambulatory Visit: Payer: PPO | Admitting: Adult Health

## 2019-11-02 VITALS — BP 108/60 | HR 105 | Temp 97.3°F | Ht 70.0 in | Wt 212.0 lb

## 2019-11-02 DIAGNOSIS — R55 Syncope and collapse: Secondary | ICD-10-CM

## 2019-11-02 NOTE — Patient Instructions (Signed)
Your Plan:  Continue to monitor symptoms Follow-up with PCP about anxiety   Thank you for coming to see [place] at [facility]. I hope we have been able to provide you high quality care today.  You may receive a patient satisfaction survey over the next few weeks. We would appreciate your feedback and comments so that we may continue to improve ourselves and the health of our patients.

## 2019-11-02 NOTE — Progress Notes (Addendum)
PATIENT: Nathan [name] DOB: 1950-10-11  REASON FOR VISIT: follow up HISTORY FROM: patient  HISTORY OF PRESENT ILLNESS: Today 11/02/19:  Mr. Kirtz is a 69 year old male with a history of near syncope events.  He returns today for follow-up.  He did have an ambulatory EEG with 8 events but these events did not correlate with any change in brain activity.  Patient was able to get his wife on the phone.  She describes the events as him stalling then he tries to determine where he is.  During these events his breathing is a little slower and his voice changes.  She states that the events last no longer than 1 minute.  She states that they always seem to be precipitate by a stressful event or conversation.  She reports that he will get agitated if he is question or is resistant in a conversation.  Patient denies any convulsing or loss of bowel or bladder during the events.  Initially the patient felt that he did not have any significant stress or anxiety.  However later in the conversation he acknowledges that his wife has multiple sclerosis and he is her primary caregiver.  He states that he does not get any "me" time.  He does feel that most of these events are precipitated by stress or agitation.  He does have a caregiver that stays with his wife during the day but he takes care of her when he gets home after work.  Reports that his PCP did provide him with antianxiety medication.  Reports that he did pick it up but he has never started it.  HISTORY  Choya TANIA FRIELING is a 69 y.o. male here as requested by Dr. Karle Starch for syncope. PMHx diabetes, prostate cancer, HLD.  He feels better. He felt better about a week after discharge. After the flu shot he started having syncopal episodes. He is a Clinical biochemist at [employer]. Can be sitting or standing or even laying down. Everything goes blank. He feels like he is having fuzz in the head, feels like something is coming, few seconds, takes him aback, and it  completely goes away, very brief. He kept having them repeatedly for 10 days. One episode he was sitting in his recliner felt very dizzy, like going to pass out - the next morning he got up and walked to the door and it hit him again and thought he was going to hit the floor. He never lost consciousness. Never had episodes before. No prior illnesses. Would last a few seconds. He went to the ED and he continued to be lightheaded. Episodes on movement. Would happen when reaching for remote for example.   REVIEW OF SYSTEMS: Out of a complete 14 system review of symptoms, the patient complains only of the following symptoms, and all other reviewed systems are negative.   See HPI  ALLERGIES: No Known Allergies  HOME MEDICATIONS: Outpatient Medications Prior to Visit  Medication Sig Dispense Refill  . Cholecalciferol 50 MCG (2000 UT) TABS Take 2,000 Units by mouth daily.    . rosuvastatin (CRESTOR) 20 MG tablet Take 20 mg by mouth at bedtime.     . sitaGLIPtin-metformin (JANUMET) 50-1000 MG tablet Take 1 tablet by mouth 2 (two) times daily with a meal.    . calcium-vitamin D (OSCAL WITH D) 500-200 MG-UNIT tablet Take 1 tablet by mouth.     No facility-administered medications prior to visit.    PAST MEDICAL HISTORY: Past Medical History:  Diagnosis  Date  . Diabetes mellitus without complication ([place])    type 2  . H/O cardiac catheterization   . Hyperlipidemia   . Malignant neoplasm of prostate ([place]) 02/05/2018    PAST SURGICAL HISTORY: Past Surgical History:  Procedure Laterality Date  . colonscopy    . LYMPHADENECTOMY Bilateral 03/30/2018   Procedure: LYMPHADENECTOMY, PELVIC;  Surgeon: Raynelle Bring, MD;  Location: [facility];  Service: Urology;  Laterality: Bilateral;  . PROSTATE BIOPSY  01/2018  . ROBOT ASSISTED LAPAROSCOPIC RADICAL PROSTATECTOMY N/A 03/30/2018   Procedure: XI ROBOTIC ASSISTED LAPAROSCOPIC RADICAL PROSTATECTOMY LEVEL 3;  Surgeon: Raynelle Bring, MD;  Location: [facility];   Service: Urology;  Laterality: N/A;  ONLY NEEDS 210 MIN TOTAL FOR PROCEDURE  . vasedtomy  1986    FAMILY HISTORY: Family History  Problem Relation Age of Onset  . Cancer Mother     SOCIAL HISTORY: Social History   Socioeconomic History  . Marital status: Married    Spouse name: Not on file  . Number of children: Not on file  . Years of education: Not on file  . Highest education level: Not on file  Occupational History  . Occupation: chaplin  Tobacco Use  . Smoking status: Never Smoker  . Smokeless tobacco: Never Used  Substance and Sexual Activity  . Alcohol use: Not Currently    Alcohol/week: 0.0 standard drinks    Comment: 1970 quit  . Drug use: Never  . Sexual activity: Yes  Other Topics Concern  . Not on file  Social History Narrative  . Not on file   Social Determinants of Health   Financial Resource Strain:   . Difficulty of Paying Living Expenses: Not on file  Food Insecurity:   . Worried About Charity fundraiser in the Last Year: Not on file  . Ran Out of Food in the Last Year: Not on file  Transportation Needs:   . Lack of Transportation (Medical): Not on file  . Lack of Transportation (Non-Medical): Not on file  Physical Activity:   . Days of Exercise per Week: Not on file  . Minutes of Exercise per Session: Not on file  Stress:   . Feeling of Stress : Not on file  Social Connections:   . Frequency of Communication with Friends and Family: Not on file  . Frequency of Social Gatherings with Friends and Family: Not on file  . Attends Religious Services: Not on file  . Active Member of Clubs or Organizations: Not on file  . Attends Archivist Meetings: Not on file  . Marital Status: Not on file  Intimate Partner Violence:   . Fear of Current or Ex-Partner: Not on file  . Emotionally Abused: Not on file  . Physically Abused: Not on file  . Sexually Abused: Not on file      PHYSICAL EXAM  Vitals:   11/02/19 0843  BP: 108/60    Pulse: (!) 105  Temp: (!) 97.3 F (36.3 C)  TempSrc: Oral  Weight: 212 lb (96.2 kg)  Height: 5\' 10"  (1.778 m)   Body mass index is 30.42 kg/m.  Generalized: Well developed, in no acute distress   Neurological examination  Mentation: Alert oriented to time, place, history taking. Follows all commands speech and language fluent Cranial nerve II-XII: Pupils were equal round reactive to light. Extraocular movements were full, visual field were full on confrontational test.  Head turning and shoulder shrug  were normal and symmetric. Motor: The motor testing reveals 5  over 5 strength of all 4 extremities. Good symmetric motor tone is noted throughout.  Sensory: Sensory testing is intact to soft touch on all 4 extremities. No evidence of extinction is noted.  Coordination: Cerebellar testing reveals good finger-nose-finger and heel-to-shin bilaterally.  Gait and station: Gait is normal.  Reflexes: Deep tendon reflexes are symmetric and normal bilaterally.   DIAGNOSTIC DATA (LABS, IMAGING, TESTING) - I reviewed patient records, labs, notes, testing and imaging myself where available.  Lab Results  Component Value Date   WBC 7.0 06/11/2018   HGB 13.1 06/11/2018   HCT 42.7 06/11/2018   MCV 85.2 06/11/2018   PLT 253 06/11/2018      Component Value Date/Time   NA 138 07/29/2018 0916   K 4.6 07/29/2018 0916   CL 100 07/29/2018 0916   CO2 22 07/29/2018 0916   GLUCOSE 173 (H) 07/29/2018 0916   GLUCOSE 121 (H) 06/11/2018 0447   BUN 16 07/29/2018 0916   CREATININE 1.12 07/29/2018 0916   CALCIUM 9.9 07/29/2018 0916   PROT 7.1 05/05/2015 1230   ALBUMIN 3.6 05/05/2015 1230   AST 20 05/05/2015 1230   ALT 18 05/05/2015 1230   ALKPHOS 61 05/05/2015 1230   BILITOT 1.0 05/05/2015 1230   GFRNONAA 68 07/29/2018 0916   GFRAA 79 07/29/2018 0916   Lab Results  Component Value Date   CHOL  10/04/2008    176        ATP III CLASSIFICATION:  <200     mg/dL   Desirable  200-239  mg/dL    Borderline High  >=240    mg/dL   High          HDL 22 (L) 10/04/2008   LDLCALC (H) 10/04/2008    119        Total Cholesterol/HDL:CHD Risk Coronary Heart Disease Risk Table                     Men   Women  1/2 Average Risk   3.4   3.3  Average Risk       5.0   4.4  2 X Average Risk   9.6   7.1  3 X Average Risk  23.4   11.0        Use the calculated Patient Ratio above and the CHD Risk Table to determine the patient's CHD Risk.        ATP III CLASSIFICATION (LDL):  <100     mg/dL   Optimal  100-129  mg/dL   Near or Above                    Optimal  130-159  mg/dL   Borderline  160-189  mg/dL   High  >190     mg/dL   Very High   TRIG 176 (H) 10/04/2008   CHOLHDL 8.0 10/04/2008   Lab Results  Component Value Date   HGBA1C 7.3 (H) 07/29/2018   No results found for: [id number] Lab Results  Component Value Date   TSH 0.656 06/10/2018      ASSESSMENT AND PLAN 69 y.o. year old male  has a past medical history of Diabetes mellitus without complication ([place]), H/O cardiac catheterization, Hyperlipidemia, and Malignant neoplasm of prostate ([place]) (02/05/2018). here with :  1.  Near syncopal events  -Neurologic work-up has been unremarkable. -These events could be from an anxiety etiology -Encouraged the patient to consider a 24-hour caregiver for his wife to give him respite  care -I have encouraged the patient to use antianxiety medication prescribed by his PCP. -Encouraged him to follow-up with his PCP regarding anxiety -Certainly if symptoms worsen or he develops new symptoms he should let us know.   I spent 30 minutes of face-to-face and non-face-to-face time with patient.  This included previsit chart review, lab review, study review, order entry, electronic health record documentation, patient education.  Ward Givens, MSN, NP-C 11/02/2019, 9:12 AM [facility] [address] [place], [place] [address] [phone]  Made any corrections  needed, and agree with history, physical, neuro exam,assessment and plan as stated.     Sarina Ill, MD [facility]

## 2019-11-03 DIAGNOSIS — N5201 Erectile dysfunction due to arterial insufficiency: Secondary | ICD-10-CM | POA: Diagnosis not present

## 2019-11-03 DIAGNOSIS — C61 Malignant neoplasm of prostate: Secondary | ICD-10-CM | POA: Diagnosis not present

## 2019-11-22 ENCOUNTER — Other Ambulatory Visit (HOSPITAL_BASED_OUTPATIENT_CLINIC_OR_DEPARTMENT_OTHER): Payer: Self-pay | Admitting: Internal Medicine

## 2019-11-22 DIAGNOSIS — E785 Hyperlipidemia, unspecified: Secondary | ICD-10-CM | POA: Diagnosis not present

## 2019-11-22 DIAGNOSIS — R0989 Other specified symptoms and signs involving the circulatory and respiratory systems: Secondary | ICD-10-CM | POA: Diagnosis not present

## 2019-11-22 DIAGNOSIS — E119 Type 2 diabetes mellitus without complications: Secondary | ICD-10-CM | POA: Diagnosis not present

## 2019-11-22 DIAGNOSIS — R42 Dizziness and giddiness: Secondary | ICD-10-CM | POA: Diagnosis not present

## 2019-11-22 DIAGNOSIS — F411 Generalized anxiety disorder: Secondary | ICD-10-CM | POA: Diagnosis not present

## 2019-11-22 MED FILL — JANUMET 50-1,000 MG TABLET: 50-1000 | 30 days supply | Qty: 60 | Fill #0

## 2019-11-22 MED FILL — PANTOPRAZOLE SOD DR 40 MG T: 40 | 30 days supply | Qty: 30 | Fill #0

## 2019-11-24 DIAGNOSIS — R42 Dizziness and giddiness: Secondary | ICD-10-CM | POA: Diagnosis not present

## 2019-11-26 DIAGNOSIS — R000 Tachycardia, unspecified: Secondary | ICD-10-CM | POA: Diagnosis not present

## 2019-11-30 ENCOUNTER — Ambulatory Visit: Payer: PPO | Admitting: Neurology

## 2019-12-07 ENCOUNTER — Ambulatory Visit: Payer: Self-pay | Admitting: Family Medicine

## 2019-12-13 DIAGNOSIS — E785 Hyperlipidemia, unspecified: Secondary | ICD-10-CM | POA: Diagnosis not present

## 2019-12-13 DIAGNOSIS — F411 Generalized anxiety disorder: Secondary | ICD-10-CM | POA: Diagnosis not present

## 2019-12-13 DIAGNOSIS — K219 Gastro-esophageal reflux disease without esophagitis: Secondary | ICD-10-CM | POA: Diagnosis not present

## 2019-12-13 DIAGNOSIS — R42 Dizziness and giddiness: Secondary | ICD-10-CM | POA: Diagnosis not present

## 2019-12-13 DIAGNOSIS — R0989 Other specified symptoms and signs involving the circulatory and respiratory systems: Secondary | ICD-10-CM | POA: Diagnosis not present

## 2019-12-15 DIAGNOSIS — R42 Dizziness and giddiness: Secondary | ICD-10-CM | POA: Diagnosis not present

## 2019-12-15 MED FILL — PANTOPRAZOLE SOD DR 40 MG T: 40 | 30 days supply | Qty: 30 | Fill #1

## 2019-12-29 MED FILL — JANUMET 50-1,000 MG TABLET: 50-1000 | 30 days supply | Qty: 60 | Fill #1

## 2020-02-08 ENCOUNTER — Telehealth: Payer: Self-pay

## 2020-02-08 NOTE — Telephone Encounter (Signed)
Spoke to pt, gave directions to office Pt states he will be here tomorrow around 115

## 2020-02-08 NOTE — Telephone Encounter (Signed)
Pt called office and left a VM asking if it is ok to check in at 1:15 tomorrow for his 1:30pm appointment. Please call him at [phone].

## 2020-02-09 ENCOUNTER — Telehealth: Payer: Self-pay | Admitting: *Deleted

## 2020-02-09 ENCOUNTER — Ambulatory Visit: Payer: PPO | Admitting: Adult Health

## 2020-02-09 NOTE — Telephone Encounter (Signed)
I called and spoke to pt.  I relayed that per MM/NP and AA/MD our neuro work up has been negative and have nothing more to offer him.  I relayed that per MM/NP last note that seeing pcp for anxiety, he stated he did not have anxiety.  I stated that MM/NP would be glad to see him due to having increased episodes but he stated that if she had nothing more to offer then he did not need to come.  I mentioned about referral to academic center [facility]) for second opinion.  He stated he would have his pcp do some labs, then refer if needed.  I cancelled tha appt for today at 1330.

## 2020-02-09 NOTE — Telephone Encounter (Signed)
Noted. 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 Can see office note.

## 2020-02-10 DIAGNOSIS — E119 Type 2 diabetes mellitus without complications: Secondary | ICD-10-CM | POA: Diagnosis not present

## 2020-02-10 DIAGNOSIS — F411 Generalized anxiety disorder: Secondary | ICD-10-CM | POA: Diagnosis not present

## 2020-02-10 DIAGNOSIS — E785 Hyperlipidemia, unspecified: Secondary | ICD-10-CM | POA: Diagnosis not present

## 2020-02-10 DIAGNOSIS — R42 Dizziness and giddiness: Secondary | ICD-10-CM | POA: Diagnosis not present

## 2020-05-10 ENCOUNTER — Emergency Department (HOSPITAL_BASED_OUTPATIENT_CLINIC_OR_DEPARTMENT_OTHER): Payer: PPO

## 2020-05-10 ENCOUNTER — Emergency Department (HOSPITAL_COMMUNITY): Payer: PPO

## 2020-05-10 ENCOUNTER — Encounter (HOSPITAL_COMMUNITY): Payer: Self-pay | Admitting: Emergency Medicine

## 2020-05-10 ENCOUNTER — Emergency Department (HOSPITAL_COMMUNITY)
Admission: EM | Admit: 2020-05-10 | Discharge: 2020-05-10 | Disposition: A | Discharge status: Home / Self Care | Payer: PPO | Attending: Emergency Medicine | Admitting: Emergency Medicine

## 2020-05-10 DIAGNOSIS — Z79899 Other long term (current) drug therapy: Secondary | ICD-10-CM | POA: Diagnosis not present

## 2020-05-10 DIAGNOSIS — E119 Type 2 diabetes mellitus without complications: Secondary | ICD-10-CM | POA: Diagnosis not present

## 2020-05-10 DIAGNOSIS — M79605 Pain in left leg: Secondary | ICD-10-CM | POA: Diagnosis not present

## 2020-05-10 DIAGNOSIS — R52 Pain, unspecified: Secondary | ICD-10-CM | POA: Diagnosis not present

## 2020-05-10 DIAGNOSIS — Z8546 Personal history of malignant neoplasm of prostate: Secondary | ICD-10-CM | POA: Diagnosis not present

## 2020-05-10 DIAGNOSIS — M79662 Pain in left lower leg: Secondary | ICD-10-CM | POA: Diagnosis not present

## 2020-05-10 DIAGNOSIS — Z7984 Long term (current) use of oral hypoglycemic drugs: Secondary | ICD-10-CM | POA: Diagnosis not present

## 2020-05-10 DIAGNOSIS — R002 Palpitations: Secondary | ICD-10-CM | POA: Diagnosis not present

## 2020-05-10 DIAGNOSIS — R072 Precordial pain: Secondary | ICD-10-CM | POA: Diagnosis not present

## 2020-05-10 DIAGNOSIS — R0789 Other chest pain: Secondary | ICD-10-CM | POA: Diagnosis not present

## 2020-05-10 LAB — CBC WITH DIFFERENTIAL/PLATELET
Abs Immature Granulocytes: 0.01 10*3/uL (ref 0.00–0.07)
Basophils Absolute: 0 10*3/uL (ref 0.0–0.1)
Basophils Relative: 1 %
Eosinophils Absolute: 0.1 10*3/uL (ref 0.0–0.5)
Eosinophils Relative: 2 %
HCT: 42.5 % (ref 39.0–52.0)
Hemoglobin: 13.5 g/dL (ref 13.0–17.0)
Immature Granulocytes: 0 %
Lymphocytes Relative: 20 %
Lymphs Abs: 0.7 10*3/uL (ref 0.7–4.0)
MCH: 27.2 pg (ref 26.0–34.0)
MCHC: 31.8 g/dL (ref 30.0–36.0)
MCV: 85.5 fL (ref 80.0–100.0)
Monocytes Absolute: 0.2 10*3/uL (ref 0.1–1.0)
Monocytes Relative: 7 %
Neutro Abs: 2.4 10*3/uL (ref 1.7–7.7)
Neutrophils Relative %: 70 %
Platelets: 210 10*3/uL (ref 150–400)
RBC: 4.97 MIL/uL (ref 4.22–5.81)
RDW: 12.5 % (ref 11.5–15.5)
WBC: 3.4 10*3/uL — ABNORMAL LOW (ref 4.0–10.5)
nRBC: 0 % (ref 0.0–0.2)

## 2020-05-10 LAB — COMPREHENSIVE METABOLIC PANEL
ALT: 21 U/L (ref 0–44)
AST: 18 U/L (ref 15–41)
Albumin: 3.9 g/dL (ref 3.5–5.0)
Alkaline Phosphatase: 128 U/L — ABNORMAL HIGH (ref 38–126)
Anion gap: 7 (ref 5–15)
BUN: 13 mg/dL (ref 8–23)
CO2: 28 mmol/L (ref 22–32)
Calcium: 9.5 mg/dL (ref 8.9–10.3)
Chloride: 103 mmol/L (ref 98–111)
Creatinine, Ser: 1.02 mg/dL (ref 0.61–1.24)
GFR calc Af Amer: >60 mL/min (ref >60)
GFR calc non Af Amer: >60 mL/min (ref >60)
Glucose, Bld: 232 mg/dL — ABNORMAL HIGH (ref 70–99)
Potassium: 4.3 mmol/L (ref 3.5–5.1)
Sodium: 138 mmol/L (ref 135–145)
Total Bilirubin: 0.7 mg/dL (ref 0.3–1.2)
Total Protein: 7 g/dL (ref 6.5–8.1)

## 2020-05-10 LAB — TROPONIN I (HIGH SENSITIVITY): Troponin I (High Sensitivity): 3 ng/L (ref <18)

## 2020-05-10 LAB — D-DIMER, QUANTITATIVE: D-Dimer, Quant: 0.34 ug/mL-FEU (ref 0.00–0.50)

## 2020-05-10 MED ORDER — CYCLOBENZAPRINE HCL 10 MG PO TABS: 10.0000 mg | ORAL_TABLET | Freq: Two times a day (BID) | ORAL | 0 refills | Status: DC | PRN

## 2020-05-10 NOTE — ED Notes (Signed)
Reviewed discharge instructions with patient. Follow-up care and medications reviewed. Patient  verbalized understanding. Patient A&Ox4, VSS, and ambulatory with steady gait upon discharge.  °

## 2020-05-10 NOTE — Discharge Instructions (Signed)
Your work-up today did not show any evidence of blood clot in your leg or your lungs.  Your heart enzyme test was negative.  As you had pain several days ago and had not had symptoms recently, we agreed to do a single troponin test today.  Your other work-up was reassuring.  I suspect this is more musculoskeletal discomfort causing your symptoms.  Please rest and stay hydrated use the muscle relaxant if needed.  Please follow-up with your primary doctor.  If any symptoms change or worsen, please not hesitate to return to the nearest emergency department.

## 2020-05-10 NOTE — ED Provider Notes (Signed)
[name] EMERGENCY DEPARTMENT Provider Note   CSN: [id number] Arrival date & time: 05/10/20  7829     History Chief Complaint  Patient presents with  . Leg Pain    Nathan [name] is a 69 y.o. male.  The history is provided by the patient and medical records. No language interpreter was used.  Leg Pain Location:  Leg Injury: no   Leg location:  L lower leg, L upper leg and L leg Pain details:    Quality:  Aching, throbbing, cramping and dull   Severity:  Moderate   Onset quality:  Gradual   Timing:  Constant   Progression:  Unchanged Tetanus status:  Unknown Relieved by:  Nothing Worsened by:  Nothing Ineffective treatments:  None tried Associated symptoms: no back pain, no fatigue, no fever, no neck pain, no numbness, no swelling and no tingling        Past Medical History:  Diagnosis Date  . Diabetes mellitus without complication ([place])    type 2  . H/O cardiac catheterization   . Hyperlipidemia   . Malignant neoplasm of prostate ([place]) 02/05/2018    Patient Active Problem List   Diagnosis Date Noted  . Dizziness 06/11/2018  . Syncope and collapse 06/10/2018  . History of prostate cancer 06/01/2018  . Prostate cancer ([place]) 03/30/2018  . Malignant neoplasm of prostate ([place]) 02/05/2018  . Obesity (BMI 30.0-34.9) 12/30/2016  . Right hip pain 06/25/2016  . Bilateral knee pain 02/23/2016  . Non-insulin dependent type 2 diabetes mellitus ([place]) 10/09/2015  . 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 H/O adenomatous polyp of colon 08/01/2015  . Elevated CK 08/01/2015  . Hyperlipidemia 06/23/2015    Past Surgical History:  Procedure Laterality Date  . colonscopy    . LYMPHADENECTOMY Bilateral 03/30/2018   Procedure: LYMPHADENECTOMY, PELVIC;  Surgeon: Raynelle Bring, MD;  Location: [facility];  Service: Urology;  Laterality: Bilateral;  . PROSTATE BIOPSY  01/2018  . ROBOT ASSISTED LAPAROSCOPIC RADICAL PROSTATECTOMY N/A 03/30/2018     Procedure: XI ROBOTIC ASSISTED LAPAROSCOPIC RADICAL PROSTATECTOMY LEVEL 3;  Surgeon: Raynelle Bring, MD;  Location: [facility];  Service: Urology;  Laterality: N/A;  ONLY NEEDS 210 MIN TOTAL FOR PROCEDURE  . vasedtomy  1986       Family History  Problem Relation Age of Onset  . Cancer Mother     Social History   Tobacco Use  . Smoking status: Never Smoker  . Smokeless tobacco: Never Used  Vaping Use  . Vaping Use: Never used  Substance Use Topics  . Alcohol use: Not Currently    Alcohol/week: 0.0 standard drinks    Comment: 1970 quit  . Drug use: Never    Home Medications Prior to Admission medications   Medication Sig Start Date End Date Taking? Authorizing Provider  Cholecalciferol 50 MCG (2000 UT) TABS Take 2,000 Units by mouth daily.    [provider]  rosuvastatin (CRESTOR) 20 MG tablet Take 20 mg by mouth at bedtime.     [provider]  sitaGLIPtin-metformin (JANUMET) 50-1000 MG tablet Take 1 tablet by mouth 2 (two) times daily with a meal.    [provider]    Allergies    Patient has no known allergies.  Review of Systems   Review of Systems  Constitutional: Negative for chills, diaphoresis, fatigue and fever.  HENT: Negative for congestion.   Eyes: Negative for visual disturbance.  Respiratory: Negative for cough, chest tightness, shortness of breath  and wheezing.   Cardiovascular: Positive for chest pain (resolved), palpitations and leg swelling.  Gastrointestinal: Negative for abdominal pain, constipation, diarrhea, nausea and vomiting.  Genitourinary: Negative for dysuria, flank pain and frequency.  Musculoskeletal: Negative for back pain, neck pain and neck stiffness.  Skin: Negative for color change, rash and wound.  Neurological: Negative for light-headedness and headaches.  Psychiatric/Behavioral: Negative for agitation and confusion.  All other systems reviewed and are negative.   Physical Exam Updated Vital Signs BP  133/81   Pulse 84   Temp 98.4 F (36.9 C) (Oral)   Resp (!) 22   SpO2 100%   Physical Exam Vitals and nursing note reviewed.  Constitutional:      General: He is not in acute distress.    Appearance: He is well-developed. He is not ill-appearing, toxic-appearing or diaphoretic.  HENT:     Head: Normocephalic and atraumatic.     Nose: No congestion or rhinorrhea.     Mouth/Throat:     Pharynx: No oropharyngeal exudate or posterior oropharyngeal erythema.  Eyes:     Conjunctiva/sclera: Conjunctivae normal.  Cardiovascular:     Rate and Rhythm: Normal rate and regular rhythm.     Heart sounds: No murmur heard.   Pulmonary:     Effort: Pulmonary effort is normal. No respiratory distress.     Breath sounds: Normal breath sounds. No wheezing or rhonchi.  Chest:     Chest wall: No tenderness.  Abdominal:     General: Abdomen is flat.     Palpations: Abdomen is soft.     Tenderness: There is no abdominal tenderness. There is no right CVA tenderness, left CVA tenderness, guarding or rebound.  Musculoskeletal:        General: Tenderness present.     Cervical back: Neck supple.     Left upper leg: Tenderness present.     Right lower leg: No edema.     Left lower leg: No edema.       Legs:     Comments: Normal sensation, pulse, strength in legs.  Tenderness in the medial left thigh area.  No skin change or rash seen.  Doubt shingles.  Skin:    General: Skin is warm and dry.     Capillary Refill: Capillary refill takes less than 2 seconds.     Coloration: Skin is not pale.     Findings: No bruising or erythema.  Neurological:     General: No focal deficit present.     Mental Status: He is alert.     Cranial Nerves: No cranial nerve deficit.     Sensory: No sensory deficit.     Motor: No weakness.     ED Results / Procedures / Treatments   Labs (all labs ordered are listed, but only abnormal results are displayed) Labs Reviewed  CBC WITH DIFFERENTIAL/PLATELET - Abnormal;  Notable for the following components:      Result Value   WBC 3.4 (*)    All other components within normal limits  COMPREHENSIVE METABOLIC PANEL - Abnormal; Notable for the following components:   Glucose, Bld 232 (*)    Alkaline Phosphatase 128 (*)    All other components within normal limits  D-DIMER, QUANTITATIVE (NOT AT [facility])  TROPONIN I (HIGH SENSITIVITY)  TROPONIN I (HIGH SENSITIVITY)    EKG EKG Interpretation  Date/Time:  Wednesday May 10 2020 10:04:33 EDT Ventricular Rate:  91 PR Interval:    QRS Duration: 90 QT Interval:  398 QTC  Calculation: 490 R Axis:   85 Text Interpretation: Sinus rhythm Borderline right axis deviation ST elevation, consider inferior injury Borderline prolonged QT interval When compared to prior, similar apperance. no STEMI Confirmed by Antony Blackbird [phone]) on 05/10/2020 10:08:23 AM   Radiology DG Chest 2 View  Result Date: 05/10/2020 CLINICAL DATA:  Chest pressure 2 days EXAM: CHEST - 2 VIEW COMPARISON:  06/20/2014, 06/22/2018 FINDINGS: The heart size and mediastinal contours are within normal limits. Both lungs are clear. The visualized skeletal structures are unremarkable. IMPRESSION: No active cardiopulmonary disease. Electronically Signed   By: Franchot Gallo M.D.   On: 05/10/2020 10:47   VAS [place] LOWER EXTREMITY VENOUS (DVT) (ONLY [facility] & [facility])  Result Date: 05/10/2020  Lower Venous DVTStudy Indications: Pain.  Risk Factors: None identified. Performing Technologist: Griffin Basil RCT RDMS  Examination Guidelines: A complete evaluation includes B-mode imaging, spectral Doppler, color Doppler, and power Doppler as needed of all accessible portions of each vessel. Bilateral testing is considered an integral part of a complete examination. Limited examinations for reoccurring indications may be performed as noted. The reflux portion of the exam is performed with the patient in reverse Trendelenburg.   +---------+---------------+---------+-----------+----------+--------------+ LEFT     CompressibilityPhasicitySpontaneityPropertiesThrombus Aging +---------+---------------+---------+-----------+----------+--------------+ CFV      Full           Yes      Yes                                 +---------+---------------+---------+-----------+----------+--------------+ SFJ      Full                                                        +---------+---------------+---------+-----------+----------+--------------+ FV Prox  Full                                                        +---------+---------------+---------+-----------+----------+--------------+ FV Mid   Full                                                        +---------+---------------+---------+-----------+----------+--------------+ FV DistalFull                                                        +---------+---------------+---------+-----------+----------+--------------+ PFV      Full                                                        +---------+---------------+---------+-----------+----------+--------------+ POP      Full           Yes      Yes                                 +---------+---------------+---------+-----------+----------+--------------+  PTV      Full                                                        +---------+---------------+---------+-----------+----------+--------------+ PERO     Full                                                        +---------+---------------+---------+-----------+----------+--------------+     Summary: RIGHT: - No evidence of common femoral vein obstruction.  LEFT: - There is no evidence of deep vein thrombosis in the lower extremity.  - No cystic structure found in the popliteal fossa.  *See table(s) above for measurements and observations.    Preliminary     Procedures Procedures (including critical care time)  Medications  Ordered in ED Medications - No data to display  ED Course  I have reviewed the triage vital signs and the nursing notes.  Pertinent labs & imaging results that were available during my care of the patient were reviewed by me and considered in my medical decision making (see chart for details).    MDM Rules/Calculators/A&P                          Didier DATON SZILAGYI is a 69 y.o. male with a past medical history significant for diabetes, hyperlipidemia, prior prostate cancer status post resection who presents with several days of intermittent chest pressure as well as several days of left leg pain.  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  He says that the symptoms recurred again today prompting him to check in and be evaluated.  He describes that his discomfort is primarily his left inner thigh and groin that radiates down towards his calf.  He says it feels "like it is along that vein".  He denies any edema and denies any history of DVT or PE.  He says that he has had no trauma and denies any pain in his inguinal area testicles, scrotum, or penis.  He denies any shortness of breath, palpitations, chills, nausea, vomiting, diaphoresis.  He does report the chest discomfort was about a 6 out of 10 in severity and was a constant pressure lasting for hours at a time and lasted for several days.  He reports that has resolved.  He denies any long travel recently or immobilization.  He does feel somewhat fatigued.  On exam, lungs were clear and chest was nontender.  Abdomen was nontender.  Patient did have tenderness in the left medial groin and inner thigh going down towards his knee and calf.  He did have palpable DP and PT pulse had normal sensation and strength.  Normal gait.  No flank or back tenderness.  Exam otherwise unremarkable.  No rash seen on his chest.  No rash seen on his leg.  EKG shows no STEMI and appears similar to prior.  Clinically  I am somewhat concerned about DVT and possible PE versus cardiac cause of his symptoms.  We will get a DVT study as well as a D-dimer and will get a PE  study if the dimer is elevated.  We will do the dimer to try to avoid irradiating CT if possible.  Chest x-Irfan initially as well as troponin and labs.  Anticipate reassessment after work-up.        Patient's DVT ultrasound was negative.  Troponin negative initially.  We agreed to hold on a second troponin as the chest pain was several days ago and he has not had any recent symptoms.  His other work-up is reassuring.  Suspect muscle cell discomfort.  With lack of rash, doubt shingles at this time.  We had a discussion and he is willing to take a prescription for muscle relaxant if his symptoms persist.  He will follow-up with his PCP and understands return precautions.  Otherwise work-up reassuring.  Patient discharged in good condition.     Final Clinical Impression(s) / ED Diagnoses Final diagnoses:  Left leg pain  Precordial pain    Rx / DC Orders ED Discharge Orders    None      Clinical Impression: 1. Left leg pain   2. Precordial pain     Disposition: Discharge  Condition: Good  I have discussed the results, Dx and Tx plan with the pt(& family if present). He/she/they expressed understanding and agree(s) with the plan. Discharge instructions discussed at great length. Strict return precautions discussed and pt &/or family have verbalized understanding of the instructions. No further questions at time of discharge.    New Prescriptions   CYCLOBENZAPRINE (FLEXERIL) 10 MG TABLET    Take 1 tablet (10 mg total) by mouth 2 (two) times daily as needed for muscle spasms.    Follow Up: Kristopher Glee., MD [address] [place] [address] [place] [address] [id number] mc [place]       Laird Runnion,  Gwenyth Allegra, MD 05/10/20 1300

## 2020-05-10 NOTE — ED Triage Notes (Signed)
Pt reports left groin pain radiating down leg. Reports joint aching and chest pressure 2 days ago. No SOB. No chest pressure now

## 2020-05-10 NOTE — Progress Notes (Signed)
Left lower Ext. study completed.   See CVProc for preliminary results.   Griffin Basil, RDMS, RVT

## 2020-05-11 MED FILL — CYCLOBENZAPRINE HCL 10 MG T: 10 | 10 days supply | Qty: 20 | Fill #0

## 2020-05-17 DIAGNOSIS — Z23 Encounter for immunization: Secondary | ICD-10-CM | POA: Diagnosis not present

## 2020-05-17 DIAGNOSIS — E119 Type 2 diabetes mellitus without complications: Secondary | ICD-10-CM | POA: Diagnosis not present

## 2020-05-17 DIAGNOSIS — F411 Generalized anxiety disorder: Secondary | ICD-10-CM | POA: Diagnosis not present

## 2020-05-17 DIAGNOSIS — M1712 Unilateral primary osteoarthritis, left knee: Secondary | ICD-10-CM | POA: Diagnosis not present

## 2020-05-17 DIAGNOSIS — M25562 Pain in left knee: Secondary | ICD-10-CM | POA: Diagnosis not present

## 2020-05-17 DIAGNOSIS — E785 Hyperlipidemia, unspecified: Secondary | ICD-10-CM | POA: Diagnosis not present

## 2020-05-17 DIAGNOSIS — E559 Vitamin D deficiency, unspecified: Secondary | ICD-10-CM | POA: Diagnosis not present

## 2020-05-18 DIAGNOSIS — E119 Type 2 diabetes mellitus without complications: Secondary | ICD-10-CM | POA: Diagnosis not present

## 2020-05-18 DIAGNOSIS — E785 Hyperlipidemia, unspecified: Secondary | ICD-10-CM | POA: Diagnosis not present

## 2020-05-18 DIAGNOSIS — R748 Abnormal levels of other serum enzymes: Secondary | ICD-10-CM | POA: Diagnosis not present

## 2020-05-19 ENCOUNTER — Other Ambulatory Visit (HOSPITAL_BASED_OUTPATIENT_CLINIC_OR_DEPARTMENT_OTHER): Payer: Self-pay | Admitting: Internal Medicine

## 2020-05-19 MED FILL — ROSUVASTATIN CALCIUM 20 MG: 20 | 90 days supply | Qty: 90 | Fill #0

## 2020-05-19 MED FILL — JANUMET 50-1,000 MG TABLET: 50-1000 | 30 days supply | Qty: 60 | Fill #0

## 2020-05-19 MED FILL — LOSARTAN POTASSIUM 25 MG TA: 25 | 90 days supply | Qty: 90 | Fill #0

## 2020-05-24 DIAGNOSIS — C61 Malignant neoplasm of prostate: Secondary | ICD-10-CM | POA: Diagnosis not present

## 2020-05-29 DIAGNOSIS — E119 Type 2 diabetes mellitus without complications: Secondary | ICD-10-CM | POA: Diagnosis not present

## 2020-05-31 DIAGNOSIS — N5201 Erectile dysfunction due to arterial insufficiency: Secondary | ICD-10-CM | POA: Diagnosis not present

## 2020-05-31 DIAGNOSIS — C61 Malignant neoplasm of prostate: Secondary | ICD-10-CM | POA: Diagnosis not present

## 2020-06-06 DIAGNOSIS — R748 Abnormal levels of other serum enzymes: Secondary | ICD-10-CM | POA: Diagnosis not present

## 2020-06-16 DIAGNOSIS — R809 Proteinuria, unspecified: Secondary | ICD-10-CM | POA: Diagnosis not present

## 2020-06-16 DIAGNOSIS — E785 Hyperlipidemia, unspecified: Secondary | ICD-10-CM | POA: Diagnosis not present

## 2020-06-16 DIAGNOSIS — E1129 Type 2 diabetes mellitus with other diabetic kidney complication: Secondary | ICD-10-CM | POA: Diagnosis not present

## 2020-06-16 DIAGNOSIS — R748 Abnormal levels of other serum enzymes: Secondary | ICD-10-CM | POA: Diagnosis not present

## 2020-06-16 DIAGNOSIS — M1712 Unilateral primary osteoarthritis, left knee: Secondary | ICD-10-CM | POA: Diagnosis not present

## 2020-06-26 MED FILL — JANUMET 50-1,000 MG TABLET: 50-1000 | 30 days supply | Qty: 60 | Fill #1

## 2020-06-27 DIAGNOSIS — R1032 Left lower quadrant pain: Secondary | ICD-10-CM | POA: Diagnosis not present

## 2020-06-27 DIAGNOSIS — R1031 Right lower quadrant pain: Secondary | ICD-10-CM | POA: Diagnosis not present

## 2020-07-02 IMAGING — CT CT ANGIO HEAD
1 of 12 series · 5 of 33 positions shown · IV contrast (OMNI 350)
Comparison: Head CT 06/10/2018.  No prior angiographic imaging.

CLINICAL DATA: Syncope.

EXAM:
CT ANGIOGRAPHY HEAD AND NECK
TECHNIQUE: Multidetector CT imaging of the head and neck was performed using
the standard protocol during bolus administration of intravenous
contrast. Multiplanar CT image reconstructions and MIPs were
obtained to evaluate the vascular anatomy. Carotid stenosis
measurements (when applicable) are obtained utilizing NASCET
criteria, using the distal internal carotid diameter as the
denominator.
CONTRAST:  50mL B42CGW-7YT IOPAMIDOL (B42CGW-7YT) INJECTION 76%

[Series 11: cta neck axial · axial · 0.41mm/px · z∈[-281,-49]mm · 5 of 350 slices shown]
[im 59/350  soft-tissue]
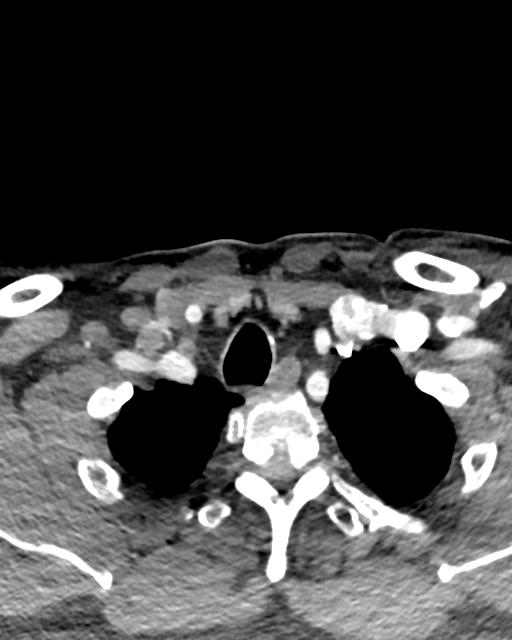
[im 117/350  bone]
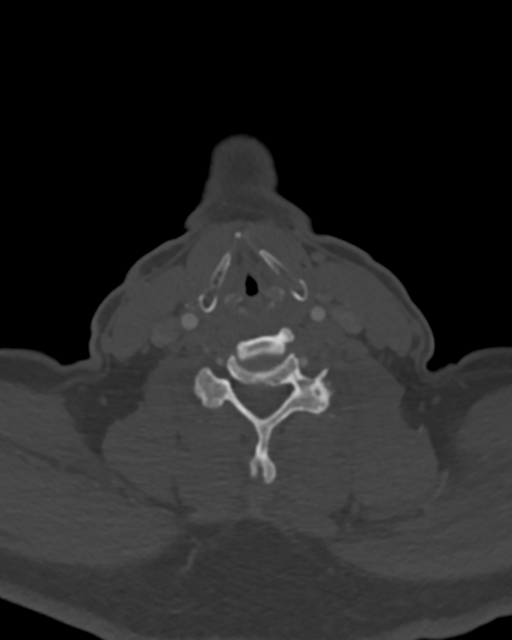
[im 175/350  soft-tissue]
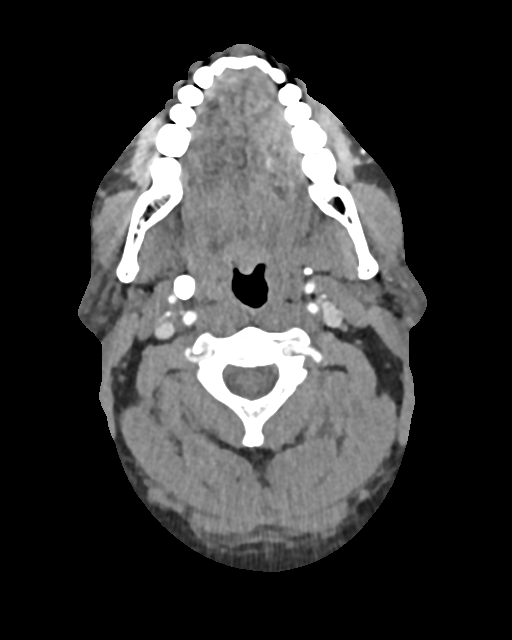
[im 233/350  bone]
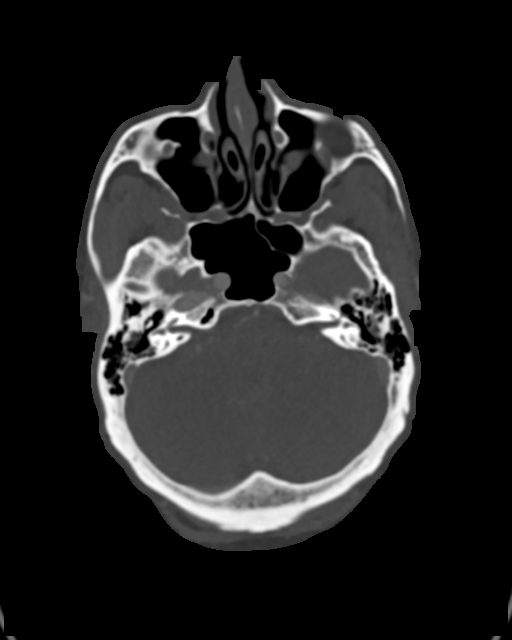
[im 291/350  soft-tissue]
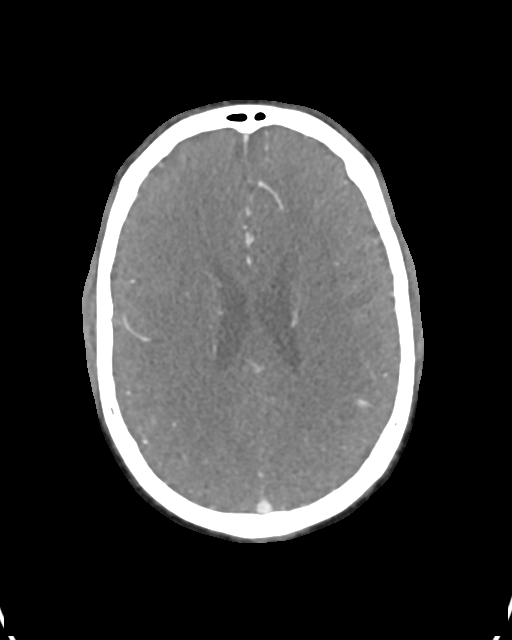

[5 of 33 positions shown; findings below may reference images not displayed]

FINDINGS: CT HEAD FINDINGS

Brain: There is no evidence of acute infarct, intracranial
hemorrhage, mass, midline shift, or extra-axial fluid collection.
The ventricles and sulci are normal.

Vascular: Evaluated below.

Skull: No fracture or focal osseous lesion.

Sinuses: Mild left ethmoid and maxillary sinus mucosal thickening.
Clear mastoid air cells.

Orbits: Unremarkable.

Review of the MIP images confirms the above findings

CTA NECK FINDINGS

Aortic arch: Normal variant aortic arch branching pattern with
common origin of the brachiocephalic and left common carotid
arteries. Widely patent arch vessel origins.

Right carotid system: Patent without evidence of stenosis,
dissection, or significant atherosclerosis.

Left carotid system: Patent without evidence of stenosis,
dissection, or significant atherosclerosis.

Vertebral arteries: Patent without evidence of stenosis, dissection,
or significant atherosclerosis. Moderately dominant left vertebral
artery.

Skeleton: Mild diffuse cervical spondylosis. No suspicious osseous
lesion.

Other neck: Asymmetric right styloid process enlargement and diffuse
right stylohyoid ligament calcification with milder changes on the
left, typically an incidental finding though can be seen with Rantona
syndrome. No neck mass.

Upper chest: Clear lung apices.

Review of the MIP images confirms the above findings

CTA HEAD FINDINGS

Anterior circulation: The internal carotid arteries are patent from
skull base to carotid termini with mild nonstenotic atherosclerosis
bilaterally. ACAs and MCAs are patent with mild branch vessel
irregularity but no evidence of proximal branch occlusion or
significant proximal stenosis. The left A1 segment is absent. No
aneurysm is identified.

Posterior circulation: The intracranial vertebral arteries are
patent to the basilar. Patent left PICA, bilateral AICA, and
bilateral SCA origins are identified. The right PICA shares a common
trunk with the right AICA. The basilar artery is widely patent.
There are prominent bilateral posterior communicating arteries, left
larger than right with absence of the left P1 segment. The PCAs are
patent with branch vessel irregularity and attenuation bilaterally
but no evidence of significant proximal stenosis. No aneurysm is
identified.

Venous sinuses: Patent.

Anatomic variants: Fetal left PCA.  Absent left A1.

Delayed phase: No abnormal enhancement.

Review of the MIP images confirms the above findings
IMPRESSION: 1. Mild intracranial atherosclerosis without large vessel occlusion,
significant proximal stenosis, or aneurysm.
2. Widely patent cervical carotid and vertebral arteries.

## 2020-07-02 IMAGING — CT CT HEAD W/O CM
4 series · 16 of 47 positions shown, 18 images · non-contrast
Comparison: Body bone scan 01/27/2018.

CLINICAL DATA: 67-year-old male with multiple near syncopal
episodes over the past 1.5 weeks. No known injury.

EXAM:
CT HEAD WITHOUT CONTRAST
TECHNIQUE: Contiguous axial images were obtained from the base of the skull
through the vertex without intravenous contrast.

[Series 3: head wo · axial · 0.45mm/px · z∈[-79,+41]mm · 7 of 33 slices shown, 9 images]
[im 5/33  brain]
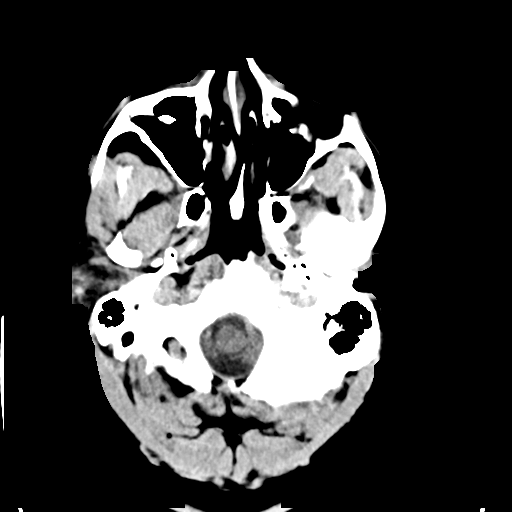
[im 5/33  bone]
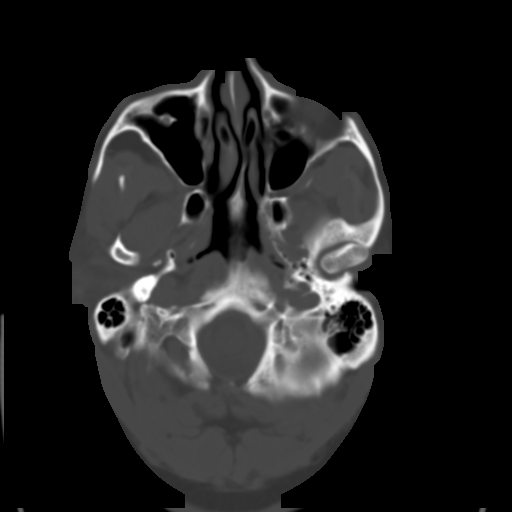
[im 9/33  brain]
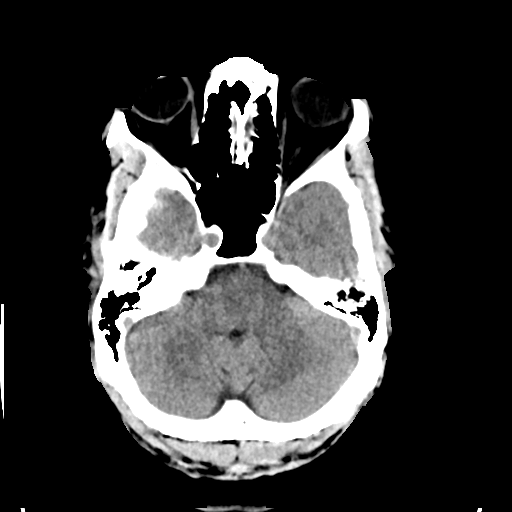
[im 13/33  brain]
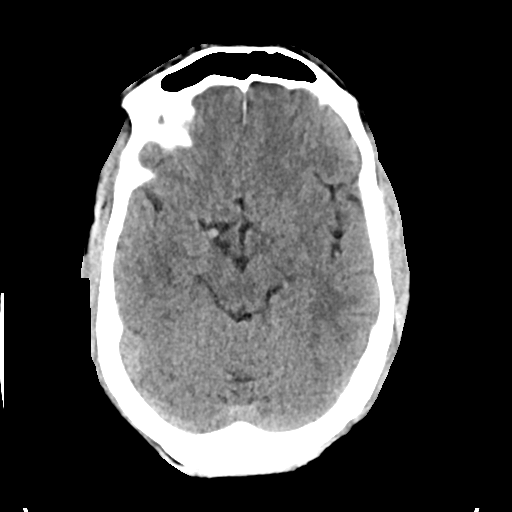
[im 17/33  brain]
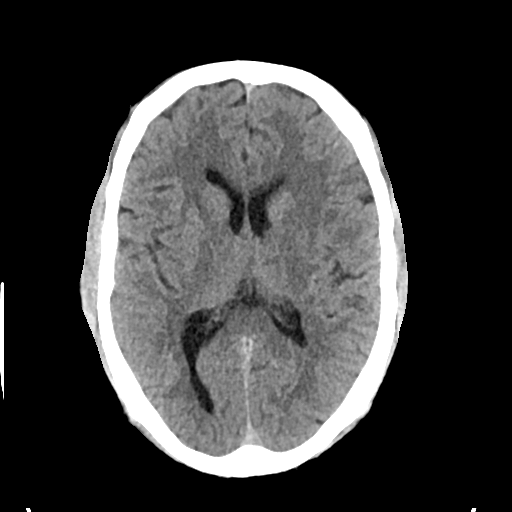
[im 21/33  brain]
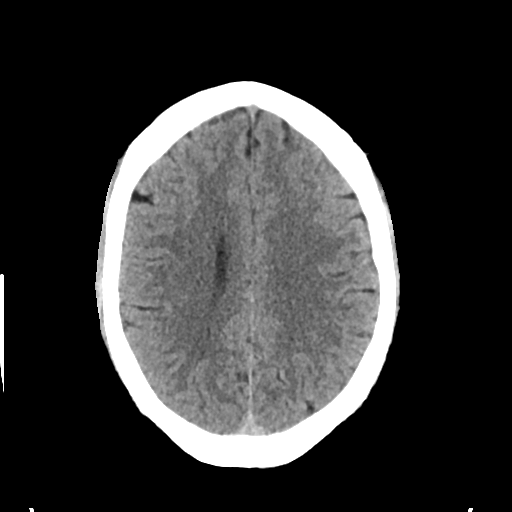
[im 21/33  bone]
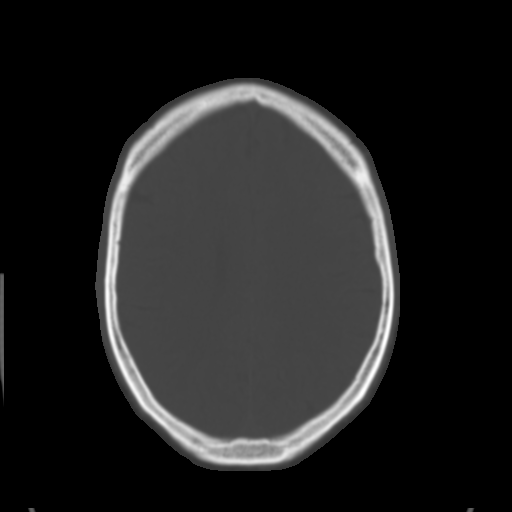
[im 25/33  brain]
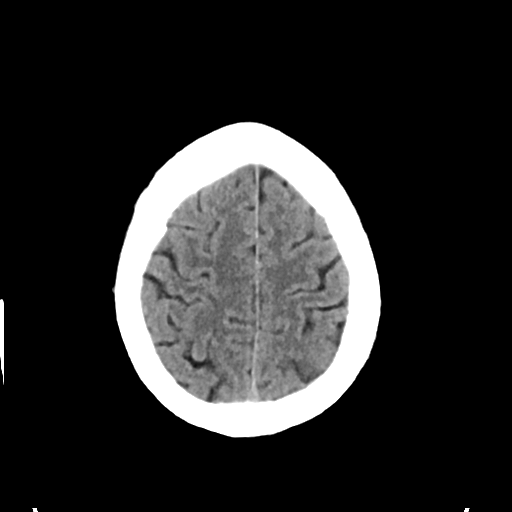
[im 29/33  brain]
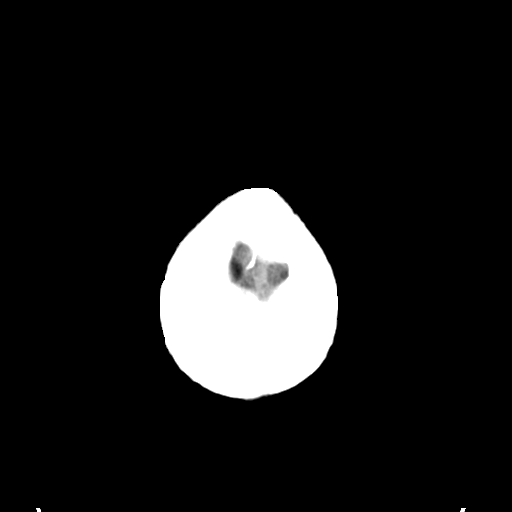

[Series 4: head bone · axial · 0.45mm/px · z∈[-83,-51]mm · 3 of 81 slices shown]
[im 9/81  bone]
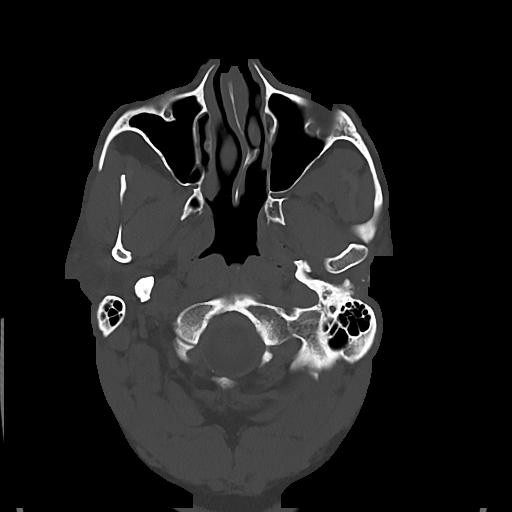
[im 17/81  bone]
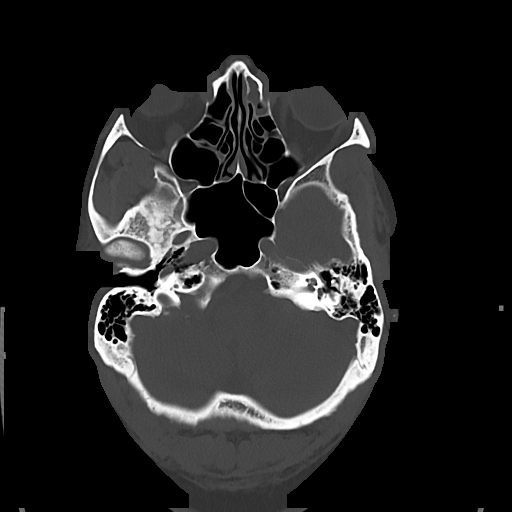
[im 25/81  bone]
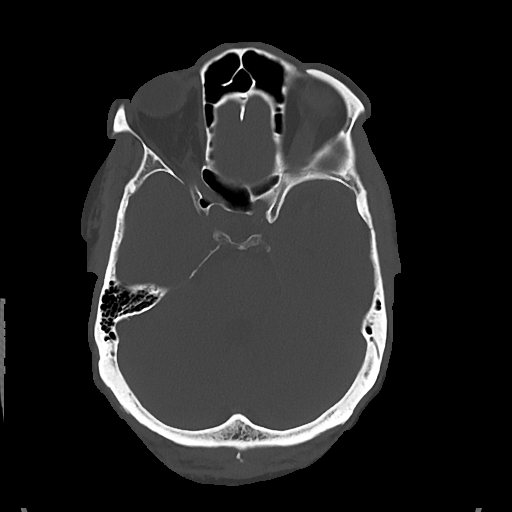

[Series 5: cor soft · coronal · 0.32mm/px · 3 of 73 slices shown]
[im 25/73  brain]
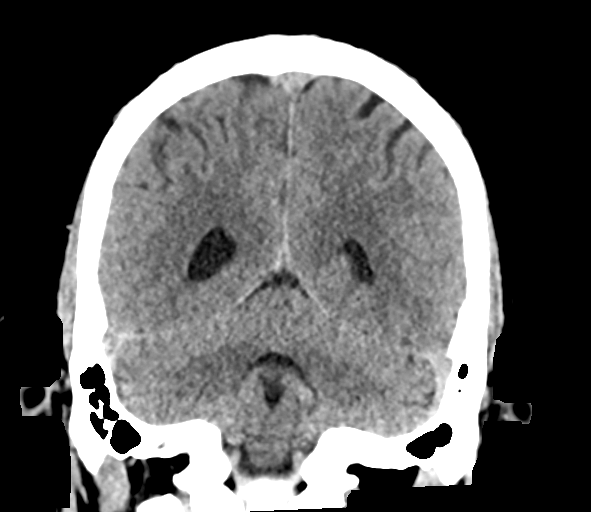
[im 33/73  brain]
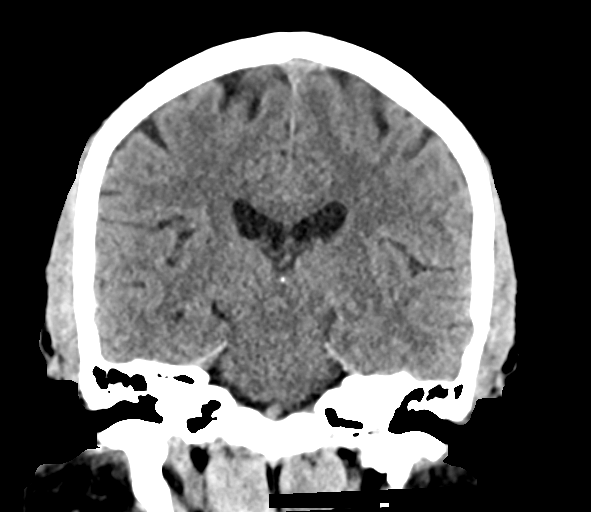
[im 41/73  brain]
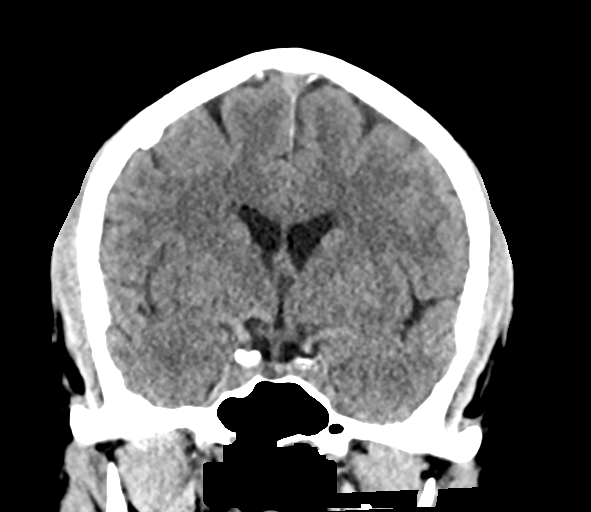

[Series 6: sag soft · sagittal · 0.32mm/px · 3 of 61 slices shown]
[im 21/61  brain]
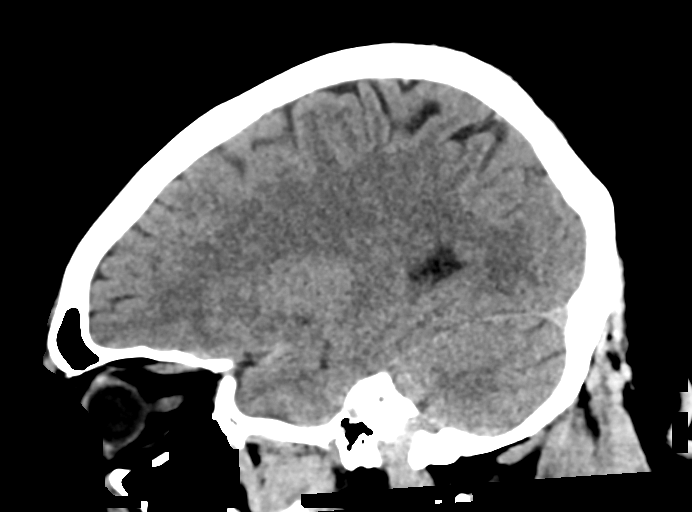
[im 31/61  brain]
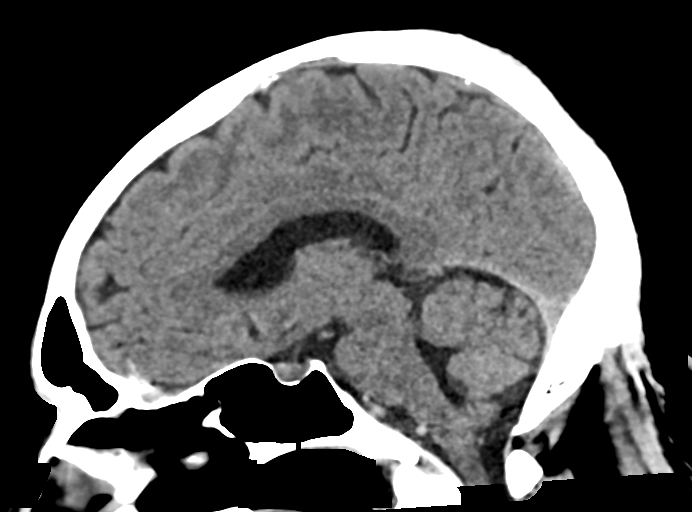
[im 41/61  brain]
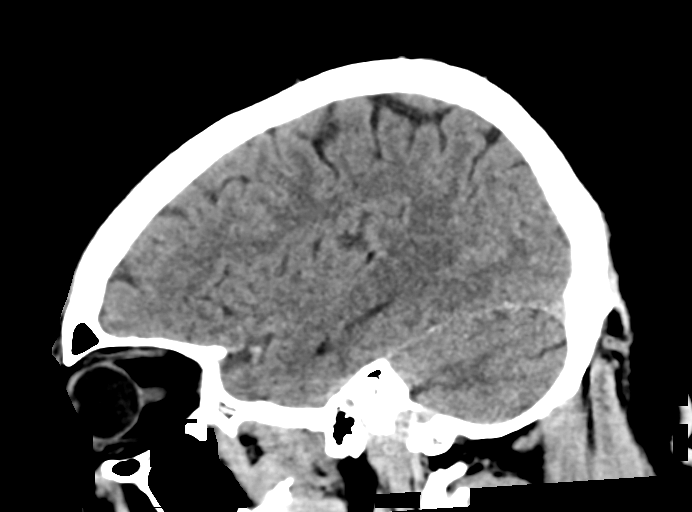

[16 of 47 positions shown; findings below may reference images not displayed]

FINDINGS: Brain: Small focus of probable inconsequential dural thickening
along the right superior frontal convexity on series 4, image 60.
Cerebral volume is within normal limits for age. No midline shift,
ventriculomegaly, mass effect, evidence of mass lesion, intracranial
hemorrhage or evidence of cortically based acute infarction.
Gray-white matter differentiation is within normal limits throughout
the brain. No cortical encephalomalacia identified.

Vascular: Mild Calcified atherosclerosis at the skull base. No
suspicious intracranial vascular hyperdensity.

Skull: Hypertrophied bilateral styloid process, stylohyoid ligament
greater on the right (series 4, image 1). Otherwise negative.

Sinuses/Orbits: Mild ethmoid and maxillary sinus mucosal thickening,
otherwise well pneumatized. Tympanic cavities and mastoids are
clear.

Other: Visualized orbit soft tissues are within normal limits. No
acute scalp soft tissue findings.
IMPRESSION: 1.  Normal for age non contrast CT appearance of the brain.
2. Mild paranasal sinus mucosal thickening.

## 2020-08-17 MED FILL — JANUMET 50-1,000 MG TABLET: 50-1000 | 30 days supply | Qty: 60 | Fill #2

## 2020-08-28 DIAGNOSIS — R748 Abnormal levels of other serum enzymes: Secondary | ICD-10-CM | POA: Diagnosis not present

## 2020-08-28 DIAGNOSIS — Z23 Encounter for immunization: Secondary | ICD-10-CM | POA: Diagnosis not present

## 2020-08-28 DIAGNOSIS — R809 Proteinuria, unspecified: Secondary | ICD-10-CM | POA: Diagnosis not present

## 2020-08-28 DIAGNOSIS — E1129 Type 2 diabetes mellitus with other diabetic kidney complication: Secondary | ICD-10-CM | POA: Diagnosis not present

## 2020-08-28 DIAGNOSIS — E785 Hyperlipidemia, unspecified: Secondary | ICD-10-CM | POA: Diagnosis not present

## 2020-08-30 MED FILL — LOSARTAN POTASSIUM 25 MG TA: 25 | 30 days supply | Qty: 30 | Fill #1

## 2020-09-01 DIAGNOSIS — E785 Hyperlipidemia, unspecified: Secondary | ICD-10-CM | POA: Diagnosis not present

## 2020-09-01 DIAGNOSIS — E1129 Type 2 diabetes mellitus with other diabetic kidney complication: Secondary | ICD-10-CM | POA: Diagnosis not present

## 2020-09-01 DIAGNOSIS — R748 Abnormal levels of other serum enzymes: Secondary | ICD-10-CM | POA: Diagnosis not present

## 2020-09-01 DIAGNOSIS — R809 Proteinuria, unspecified: Secondary | ICD-10-CM | POA: Diagnosis not present

## 2020-09-04 ENCOUNTER — Other Ambulatory Visit (HOSPITAL_BASED_OUTPATIENT_CLINIC_OR_DEPARTMENT_OTHER): Payer: Self-pay | Admitting: Internal Medicine

## 2020-09-27 DIAGNOSIS — Z0000 Encounter for general adult medical examination without abnormal findings: Secondary | ICD-10-CM | POA: Diagnosis not present

## 2020-10-09 MED FILL — ROSUVASTATIN CALCIUM 20 MG: 20 | 90 days supply | Qty: 90 | Fill #1

## 2020-10-09 MED FILL — JANUMET 50-1,000 MG TABLET: 50-1000 | 30 days supply | Qty: 60 | Fill #3

## 2020-10-18 MED FILL — PANTOPRAZOLE SOD DR 40 MG T: 40 | 30 days supply | Qty: 30 | Fill #2

## 2020-10-30 DIAGNOSIS — E119 Type 2 diabetes mellitus without complications: Secondary | ICD-10-CM | POA: Diagnosis not present

## 2020-10-30 DIAGNOSIS — H2513 Age-related nuclear cataract, bilateral: Secondary | ICD-10-CM | POA: Diagnosis not present

## 2020-10-30 DIAGNOSIS — H43813 Vitreous degeneration, bilateral: Secondary | ICD-10-CM | POA: Diagnosis not present

## 2020-10-30 DIAGNOSIS — H35413 Lattice degeneration of retina, bilateral: Secondary | ICD-10-CM | POA: Diagnosis not present

## 2020-12-11 ENCOUNTER — Other Ambulatory Visit (HOSPITAL_BASED_OUTPATIENT_CLINIC_OR_DEPARTMENT_OTHER): Payer: Self-pay

## 2020-12-11 MED FILL — Sitagliptin-Metformin HCl Tab 50-1000 MG: ORAL | 30 days supply | Qty: 60 | Fill #0 | Status: AC

## 2020-12-13 DIAGNOSIS — C61 Malignant neoplasm of prostate: Secondary | ICD-10-CM | POA: Diagnosis not present

## 2020-12-22 DIAGNOSIS — C61 Malignant neoplasm of prostate: Secondary | ICD-10-CM | POA: Diagnosis not present

## 2020-12-22 DIAGNOSIS — N5201 Erectile dysfunction due to arterial insufficiency: Secondary | ICD-10-CM | POA: Diagnosis not present

## 2020-12-25 DIAGNOSIS — E785 Hyperlipidemia, unspecified: Secondary | ICD-10-CM | POA: Diagnosis not present

## 2020-12-25 DIAGNOSIS — E1129 Type 2 diabetes mellitus with other diabetic kidney complication: Secondary | ICD-10-CM | POA: Diagnosis not present

## 2020-12-25 DIAGNOSIS — R809 Proteinuria, unspecified: Secondary | ICD-10-CM | POA: Diagnosis not present

## 2020-12-27 DIAGNOSIS — R809 Proteinuria, unspecified: Secondary | ICD-10-CM | POA: Diagnosis not present

## 2020-12-27 DIAGNOSIS — E1129 Type 2 diabetes mellitus with other diabetic kidney complication: Secondary | ICD-10-CM | POA: Diagnosis not present

## 2020-12-27 DIAGNOSIS — E785 Hyperlipidemia, unspecified: Secondary | ICD-10-CM | POA: Diagnosis not present

## 2021-03-13 ENCOUNTER — Other Ambulatory Visit (HOSPITAL_BASED_OUTPATIENT_CLINIC_OR_DEPARTMENT_OTHER): Payer: Self-pay

## 2021-03-13 MED FILL — Sitagliptin-Metformin HCl Tab 50-1000 MG: ORAL | 30 days supply | Qty: 60 | Fill #1 | Status: AC

## 2021-03-14 ENCOUNTER — Other Ambulatory Visit (HOSPITAL_BASED_OUTPATIENT_CLINIC_OR_DEPARTMENT_OTHER): Payer: Self-pay

## 2021-03-22 ENCOUNTER — Other Ambulatory Visit (HOSPITAL_BASED_OUTPATIENT_CLINIC_OR_DEPARTMENT_OTHER): Payer: Self-pay

## 2021-03-26 DIAGNOSIS — E785 Hyperlipidemia, unspecified: Secondary | ICD-10-CM | POA: Diagnosis not present

## 2021-03-26 DIAGNOSIS — E119 Type 2 diabetes mellitus without complications: Secondary | ICD-10-CM | POA: Diagnosis not present

## 2021-03-26 DIAGNOSIS — E559 Vitamin D deficiency, unspecified: Secondary | ICD-10-CM | POA: Diagnosis not present

## 2021-03-26 DIAGNOSIS — F411 Generalized anxiety disorder: Secondary | ICD-10-CM | POA: Diagnosis not present

## 2021-04-23 DIAGNOSIS — E559 Vitamin D deficiency, unspecified: Secondary | ICD-10-CM | POA: Diagnosis not present

## 2021-04-23 DIAGNOSIS — E119 Type 2 diabetes mellitus without complications: Secondary | ICD-10-CM | POA: Diagnosis not present

## 2021-05-18 DIAGNOSIS — H52223 Regular astigmatism, bilateral: Secondary | ICD-10-CM | POA: Diagnosis not present

## 2021-05-18 DIAGNOSIS — H5213 Myopia, bilateral: Secondary | ICD-10-CM | POA: Diagnosis not present

## 2021-05-18 DIAGNOSIS — H524 Presbyopia: Secondary | ICD-10-CM | POA: Diagnosis not present

## 2021-05-18 DIAGNOSIS — H43813 Vitreous degeneration, bilateral: Secondary | ICD-10-CM | POA: Diagnosis not present

## 2021-05-18 DIAGNOSIS — H2513 Age-related nuclear cataract, bilateral: Secondary | ICD-10-CM | POA: Diagnosis not present

## 2021-05-18 DIAGNOSIS — E1165 Type 2 diabetes mellitus with hyperglycemia: Secondary | ICD-10-CM | POA: Diagnosis not present

## 2021-05-18 DIAGNOSIS — H35413 Lattice degeneration of retina, bilateral: Secondary | ICD-10-CM | POA: Diagnosis not present

## 2021-05-25 ENCOUNTER — Other Ambulatory Visit (HOSPITAL_BASED_OUTPATIENT_CLINIC_OR_DEPARTMENT_OTHER): Payer: Self-pay | Admitting: Internal Medicine

## 2021-05-25 ENCOUNTER — Other Ambulatory Visit (HOSPITAL_BASED_OUTPATIENT_CLINIC_OR_DEPARTMENT_OTHER): Payer: Self-pay

## 2021-05-25 MED ORDER — JANUMET 50-1000 MG PO TABS
1.0000 | ORAL_TABLET | Freq: Two times a day (BID) | ORAL | 11 refills | Status: DC
  Filled 2021-05-25: qty 60, 30d supply, fill #0
  Filled 2021-12-19: qty 60, 30d supply, fill #1

## 2021-05-28 ENCOUNTER — Other Ambulatory Visit (HOSPITAL_BASED_OUTPATIENT_CLINIC_OR_DEPARTMENT_OTHER): Payer: Self-pay

## 2021-06-06 DIAGNOSIS — E785 Hyperlipidemia, unspecified: Secondary | ICD-10-CM | POA: Diagnosis not present

## 2021-06-06 DIAGNOSIS — D72819 Decreased white blood cell count, unspecified: Secondary | ICD-10-CM | POA: Diagnosis not present

## 2021-06-07 ENCOUNTER — Other Ambulatory Visit (HOSPITAL_BASED_OUTPATIENT_CLINIC_OR_DEPARTMENT_OTHER): Payer: Self-pay

## 2021-06-07 MED ORDER — ROSUVASTATIN CALCIUM 20 MG PO TABS
20.0000 mg | ORAL_TABLET | Freq: Every day | ORAL | 3 refills | Status: DC
  Filled 2021-06-07: qty 90, 90d supply, fill #0
  Filled 2022-02-25: qty 90, 90d supply, fill #1

## 2021-06-26 DIAGNOSIS — N393 Stress incontinence (female) (male): Secondary | ICD-10-CM | POA: Diagnosis not present

## 2021-06-26 DIAGNOSIS — R109 Unspecified abdominal pain: Secondary | ICD-10-CM | POA: Diagnosis not present

## 2021-06-26 DIAGNOSIS — C61 Malignant neoplasm of prostate: Secondary | ICD-10-CM | POA: Diagnosis not present

## 2021-07-30 ENCOUNTER — Other Ambulatory Visit (HOSPITAL_BASED_OUTPATIENT_CLINIC_OR_DEPARTMENT_OTHER): Payer: Self-pay

## 2021-07-30 DIAGNOSIS — E785 Hyperlipidemia, unspecified: Secondary | ICD-10-CM | POA: Diagnosis not present

## 2021-07-30 DIAGNOSIS — E119 Type 2 diabetes mellitus without complications: Secondary | ICD-10-CM | POA: Diagnosis not present

## 2021-07-30 DIAGNOSIS — R748 Abnormal levels of other serum enzymes: Secondary | ICD-10-CM | POA: Diagnosis not present

## 2021-07-30 DIAGNOSIS — E559 Vitamin D deficiency, unspecified: Secondary | ICD-10-CM | POA: Diagnosis not present

## 2021-07-30 DIAGNOSIS — D72819 Decreased white blood cell count, unspecified: Secondary | ICD-10-CM | POA: Diagnosis not present

## 2021-07-30 MED ORDER — JANUMET 50-1000 MG PO TABS
ORAL_TABLET | ORAL | 11 refills | Status: DC
  Filled 2021-07-30 – 2021-08-08 (×2): qty 60, 30d supply, fill #0
  Filled 2022-02-25: qty 60, 30d supply, fill #1
  Filled 2022-05-14: qty 60, 30d supply, fill #2

## 2021-08-03 ENCOUNTER — Other Ambulatory Visit (HOSPITAL_BASED_OUTPATIENT_CLINIC_OR_DEPARTMENT_OTHER): Payer: Self-pay

## 2021-08-07 ENCOUNTER — Other Ambulatory Visit (HOSPITAL_BASED_OUTPATIENT_CLINIC_OR_DEPARTMENT_OTHER): Payer: Self-pay

## 2021-08-08 ENCOUNTER — Other Ambulatory Visit (HOSPITAL_BASED_OUTPATIENT_CLINIC_OR_DEPARTMENT_OTHER): Payer: Self-pay

## 2021-09-12 ENCOUNTER — Other Ambulatory Visit (HOSPITAL_BASED_OUTPATIENT_CLINIC_OR_DEPARTMENT_OTHER): Payer: Self-pay

## 2021-09-12 MED ORDER — ERYTHROMYCIN 5 MG/GM OP OINT
TOPICAL_OINTMENT | OPHTHALMIC | 0 refills | Status: AC
  Filled 2021-09-12: qty 3.5, 14d supply, fill #0

## 2021-12-06 ENCOUNTER — Other Ambulatory Visit (HOSPITAL_BASED_OUTPATIENT_CLINIC_OR_DEPARTMENT_OTHER): Payer: Self-pay

## 2021-12-06 MED ORDER — GLIMEPIRIDE 1 MG PO TABS
ORAL_TABLET | ORAL | 1 refills | Status: DC
  Filled 2021-12-06: qty 180, 90d supply, fill #0
  Filled 2022-03-01: qty 180, 90d supply, fill #1

## 2021-12-19 ENCOUNTER — Other Ambulatory Visit (HOSPITAL_BASED_OUTPATIENT_CLINIC_OR_DEPARTMENT_OTHER): Payer: Self-pay

## 2022-02-25 ENCOUNTER — Other Ambulatory Visit (HOSPITAL_BASED_OUTPATIENT_CLINIC_OR_DEPARTMENT_OTHER): Payer: Self-pay

## 2022-03-01 ENCOUNTER — Other Ambulatory Visit (HOSPITAL_BASED_OUTPATIENT_CLINIC_OR_DEPARTMENT_OTHER): Payer: Self-pay

## 2022-04-08 ENCOUNTER — Other Ambulatory Visit (HOSPITAL_BASED_OUTPATIENT_CLINIC_OR_DEPARTMENT_OTHER): Payer: Self-pay

## 2022-04-08 MED ORDER — ROSUVASTATIN CALCIUM 40 MG PO TABS
40.0000 mg | ORAL_TABLET | Freq: Every day | ORAL | 3 refills | Status: DC
  Filled 2022-04-08: qty 90, 90d supply, fill #0

## 2022-05-14 ENCOUNTER — Other Ambulatory Visit (HOSPITAL_BASED_OUTPATIENT_CLINIC_OR_DEPARTMENT_OTHER): Payer: Self-pay

## 2022-08-22 ENCOUNTER — Other Ambulatory Visit (HOSPITAL_BASED_OUTPATIENT_CLINIC_OR_DEPARTMENT_OTHER): Payer: Self-pay

## 2022-08-23 ENCOUNTER — Other Ambulatory Visit (HOSPITAL_BASED_OUTPATIENT_CLINIC_OR_DEPARTMENT_OTHER): Payer: Self-pay

## 2022-08-23 MED ORDER — JANUMET 50-1000 MG PO TABS
1.0000 | ORAL_TABLET | Freq: Two times a day (BID) | ORAL | 11 refills | Status: DC
  Filled 2022-08-23: qty 60, 30d supply, fill #0

## 2022-11-11 ENCOUNTER — Other Ambulatory Visit (HOSPITAL_BASED_OUTPATIENT_CLINIC_OR_DEPARTMENT_OTHER): Payer: Self-pay

## 2022-11-11 MED ORDER — JANUMET 50-1000 MG PO TABS
1.0000 | ORAL_TABLET | Freq: Two times a day (BID) | ORAL | 3 refills | Status: DC
  Filled 2022-11-11: qty 180, 90d supply, fill #0

## 2022-11-26 ENCOUNTER — Other Ambulatory Visit (HOSPITAL_BASED_OUTPATIENT_CLINIC_OR_DEPARTMENT_OTHER): Payer: Self-pay

## 2022-11-26 MED ORDER — DEXCOM G6 TRANSMITTER MISC
3 refills | Status: DC
  Filled 2022-11-26: qty 1, 90d supply, fill #0
  Filled 2022-11-28: qty 1, 30d supply, fill #0

## 2022-11-26 MED ORDER — DEXCOM G7 RECEIVER DEVI
3 refills | Status: DC
Start: 2022-11-26 — End: 2023-03-10
  Filled 2022-11-26: qty 1, 90d supply, fill #0
  Filled 2022-11-28: qty 1, 30d supply, fill #0

## 2022-11-26 MED ORDER — DEXCOM G7 SENSOR MISC
3 refills | Status: DC
  Filled 2022-11-26 – 2022-11-28 (×2): qty 3, 30d supply, fill #0

## 2022-11-28 ENCOUNTER — Other Ambulatory Visit (HOSPITAL_BASED_OUTPATIENT_CLINIC_OR_DEPARTMENT_OTHER): Payer: Self-pay

## 2022-12-10 ENCOUNTER — Other Ambulatory Visit (HOSPITAL_BASED_OUTPATIENT_CLINIC_OR_DEPARTMENT_OTHER): Payer: Self-pay

## 2023-03-10 ENCOUNTER — Other Ambulatory Visit (HOSPITAL_BASED_OUTPATIENT_CLINIC_OR_DEPARTMENT_OTHER): Payer: Self-pay

## 2023-03-10 MED ORDER — EZETIMIBE 10 MG PO TABS
10.0000 mg | ORAL_TABLET | Freq: Every day | ORAL | 3 refills | Status: AC
  Filled 2023-03-10 (×2): qty 90, 90d supply, fill #0
  Filled 2023-06-05: qty 90, 90d supply, fill #1
  Filled 2023-12-18: qty 90, 90d supply, fill #2

## 2023-03-10 MED ORDER — RYBELSUS 3 MG PO TABS
3.0000 mg | ORAL_TABLET | Freq: Every day | ORAL | 0 refills | Status: DC
Start: 2023-03-10 — End: 2023-07-21
  Filled 2023-03-10 (×2): qty 30, 30d supply, fill #0

## 2023-03-10 MED ORDER — ROSUVASTATIN CALCIUM 10 MG PO TABS
10.0000 mg | ORAL_TABLET | Freq: Every day | ORAL | 3 refills | Status: DC
  Filled 2023-03-10 (×2): qty 90, 90d supply, fill #0

## 2023-03-10 MED ORDER — ROSUVASTATIN CALCIUM 40 MG PO TABS
40.0000 mg | ORAL_TABLET | Freq: Every day | ORAL | 3 refills | Status: DC
  Filled 2023-03-10 (×2): qty 90, 90d supply, fill #0
  Filled 2023-06-05: qty 90, 90d supply, fill #1

## 2023-03-10 MED ORDER — METFORMIN HCL 1000 MG PO TABS
1000.0000 mg | ORAL_TABLET | Freq: Two times a day (BID) | ORAL | 3 refills | Status: AC
  Filled 2023-03-10 (×2): qty 180, 90d supply, fill #0
  Filled 2023-06-05: qty 180, 90d supply, fill #1
  Filled 2023-12-18: qty 180, 90d supply, fill #2

## 2023-03-18 ENCOUNTER — Other Ambulatory Visit (HOSPITAL_COMMUNITY): Payer: Self-pay

## 2023-03-24 ENCOUNTER — Other Ambulatory Visit (HOSPITAL_BASED_OUTPATIENT_CLINIC_OR_DEPARTMENT_OTHER): Payer: Self-pay

## 2023-04-02 ENCOUNTER — Encounter (HOSPITAL_BASED_OUTPATIENT_CLINIC_OR_DEPARTMENT_OTHER): Payer: Self-pay

## 2023-04-02 ENCOUNTER — Other Ambulatory Visit (HOSPITAL_BASED_OUTPATIENT_CLINIC_OR_DEPARTMENT_OTHER): Payer: Self-pay

## 2023-05-12 ENCOUNTER — Other Ambulatory Visit (HOSPITAL_BASED_OUTPATIENT_CLINIC_OR_DEPARTMENT_OTHER): Payer: Self-pay

## 2023-05-12 MED ORDER — SILDENAFIL CITRATE 100 MG PO TABS: 100.0000 mg | ORAL_TABLET | ORAL | 11 refills | Status: DC | PRN

## 2023-05-12 MED ORDER — SILDENAFIL CITRATE 100 MG PO TABS
100.0000 mg | ORAL_TABLET | Freq: Every day | ORAL | 11 refills | Status: DC | PRN
  Filled 2023-05-12: qty 10, 10d supply, fill #0

## 2023-05-13 ENCOUNTER — Other Ambulatory Visit (HOSPITAL_BASED_OUTPATIENT_CLINIC_OR_DEPARTMENT_OTHER): Payer: Self-pay

## 2023-06-05 ENCOUNTER — Other Ambulatory Visit (HOSPITAL_BASED_OUTPATIENT_CLINIC_OR_DEPARTMENT_OTHER): Payer: Self-pay

## 2023-06-16 ENCOUNTER — Ambulatory Visit: Payer: PPO | Admitting: Neurology

## 2023-06-18 ENCOUNTER — Ambulatory Visit: Payer: PPO | Admitting: Neurology

## 2023-06-18 ENCOUNTER — Encounter: Payer: Self-pay | Admitting: Neurology

## 2023-06-18 VITALS — BP 107/71 | HR 92 | Ht 72.0 in | Wt 213.3 lb

## 2023-06-18 DIAGNOSIS — R55 Syncope and collapse: Secondary | ICD-10-CM

## 2023-06-18 NOTE — Progress Notes (Signed)
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 He still has the events. He was asleep and he felt like he was "ready to check out" even when laying down. No snoring, no stopping breathing, not tired during the day, no headaches, no memory loss. No loss of consciousness. But feels like his head is full up, his head fills, feels like he might pass out but never has, vert brief, happenning more he has had 3-4 in one day and then not anymore, one last night, can happen with sitting, sleeping, walking, out of nowhere, its a "whoa" feeling, brief, n room spinning, no confusion afterwards, no stress or inciting events, no vision changes, no blurred vision, not sweaty or clammy. When sleeping wife can hear it start to heppen. We have worked this lovelt patient up extensively in the past, no etiology has been found, have evaluated him cardiovascularly, neurologically, has had imaging.  EEG findings did not correlate with anyevents, wife describes him having events even in his sleep, she is worried about an adrenal gland problem(advised to see pcp)  Patient complains of symptoms per HPI as well as the following symptoms: none . Pertinent negatives and positives per HPI. All others negative   HISTORY OF PRESENT ILLNESS: Today 11/02/2019  Nathan [name] is a 72 year old male with a history of near syncope events.  He returns today for follow-up.  He did have an ambulatory EEG with 8 events but these events did not correlate with any change in brain activity.  Patient was able to get his wife on the phone.  She describes the events as him stalling then he tries to determine where he is.  During these events his breathing is a little slower and his voice changes.  She states that the events last no longer than 1 minute.  She states that they always seem to be precipitate by a stressful  event or conversation.  She reports that he will get agitated if he is question or is resistant in a conversation.  Patient denies any convulsing or loss of bowel or bladder during the events.  Initially the patient felt that he did not have any significant stress or anxiety.  However later in the conversation he acknowledges that his wife has multiple sclerosis and he is her primary caregiver.  He states that he does not get any "me" time.  He does feel that most of these events are precipitated by stress or agitation.  He does have a caregiver that stays with his wife during the day but he takes care of her when he gets home after work.  Reports that his PCP did provide him with antianxiety medication.  Reports that he did pick it up but he has never started it.  HISTORY  Nathan [name] is a 72 y.o. male here as requested by Dr. Luiz Iron for syncope. PMHx diabetes, prostate cancer, HLD.  He feels better. He felt better about a week after discharge. After the flu shot he started having syncopal episodes. He is a Orthoptist at [employer]. Can be sitting or standing or even laying down. Everything goes blank. He feels like he is having fuzz in the head, feels like something is coming, few seconds, takes him aback, and it completely goes away, very brief. He kept having them repeatedly for 10 days. One episode he was sitting in his  recliner felt very dizzy, like going to pass out - the next morning he got up and walked to the door and it hit him again and thought he was going to hit the floor. He never lost consciousness. Never had episodes before. No prior illnesses. Would last a few seconds. He went to the ED and he continued to be lightheaded. Episodes on movement. Would happen when reaching for remote for example.    REVIEW OF SYSTEMS: Out of a complete 14 system review of symptoms, the patient complains only of the following symptoms, and all other reviewed systems are negative.   See HPI  ALLERGIES: Not  on File  HOME MEDICATIONS: Outpatient Medications Prior to Visit  Medication Sig Dispense Refill   erythromycin ophthalmic ointment Apply 1 application to eyelids twice a day 3.5 g 0   ezetimibe (ZETIA) 10 MG tablet Take 1 tablet (10 mg total) by mouth daily. 90 tablet 3   metFORMIN (GLUCOPHAGE) 1000 MG tablet Take 1 tablet (1,000 mg total) by mouth 2 (two) times daily with a meal. 180 tablet 3   Semaglutide (RYBELSUS) 3 MG TABS Take 1 tablet (3 mg total) by mouth daily before breakfast. 90 tablet 0   rosuvastatin (CRESTOR) 40 MG tablet Take 1 tablet (40 mg total) by mouth daily. (Patient taking differently: Take 10 mg by mouth daily.) 90 tablet 3   sitaGLIPtin-metformin (JANUMET) 50-1000 MG tablet TAKE 1 TABLET BY MOUTH WITH BREAKFAST AND 1 TABLET WITH DINNER. 60 tablet 11   cyclobenzaprine (FLEXERIL) 10 MG tablet Take 1 tablet (10 mg total) by mouth 2 (two) times daily as needed for muscle spasms. 20 tablet 0   glimepiride (AMARYL) 1 MG tablet Take 1 tablet by mouth before breakfast and 1 tablet before dinner (Patient not taking: Reported on 06/18/2023) 180 tablet 1   pantoprazole (PROTONIX) 40 MG tablet TAKE 1 TABLET (40 MG TOTAL) BY MOUTH DAILY. 30 tablet 2   rosuvastatin (CRESTOR) 20 MG tablet TAKE 1 TABLET (20 MG TOTAL) BY MOUTH DAILY. 90 tablet 3   rosuvastatin (CRESTOR) 20 MG tablet TAKE 1 TABLET (20 MG TOTAL) BY MOUTH DAILY. 90 tablet 3   rosuvastatin (CRESTOR) 40 MG tablet Take 1 tablet (40 mg total) by mouth daily. 90 tablet 3   sildenafil (VIAGRA) 100 MG tablet Take 1 tablet (100 mg total) by mouth as directed daily as needed. 10 tablet 11   sildenafil (VIAGRA) 100 MG tablet Take 1 tablet (100 mg total) by mouth as needed. 10 tablet 11   sitaGLIPtin-metformin (JANUMET) 50-1000 MG tablet TAKE 1 TABLET BY MOUTH WITH BREAKFAST AND 1 TABLET WITH DINNER. 60 tablet 11   sitaGLIPtin-metformin (JANUMET) 50-1000 MG tablet Take 1 tablet by mouth with breakfast and 1 tablet with dinner 60 tablet  11   No facility-administered medications prior to visit.    PAST MEDICAL HISTORY: Past Medical History:  Diagnosis Date   Diabetes mellitus without complication (HCC)    type 2   H/O cardiac catheterization    Hyperlipidemia    Malignant neoplasm of prostate (HCC) 02/05/2018    PAST SURGICAL HISTORY: Past Surgical History:  Procedure Laterality Date   colonscopy     LYMPHADENECTOMY Bilateral 03/30/2018   Procedure: LYMPHADENECTOMY, PELVIC;  Surgeon: Heloise Purpura, MD;  Location: [facility];  Service: Urology;  Laterality: Bilateral;   PROSTATE BIOPSY  01/2018   ROBOT ASSISTED LAPAROSCOPIC RADICAL PROSTATECTOMY N/A 03/30/2018   Procedure: XI ROBOTIC ASSISTED LAPAROSCOPIC RADICAL PROSTATECTOMY LEVEL 3;  Surgeon: Heloise Purpura, MD;  Location: [facility];  Service: Urology;  Laterality: N/A;  ONLY NEEDS 210 MIN TOTAL FOR PROCEDURE   vasedtomy  1986    FAMILY HISTORY: Family History  Problem Relation Age of Onset   Cancer Mother    Dementia Neg Hx    Alzheimer's disease Neg Hx     SOCIAL HISTORY: Social History   Socioeconomic History   Marital status: Married    Spouse name: Not on file   Number of children: Not on file   Years of education: Not on file   Highest education level: Not on file  Occupational History   Occupation: chaplin  Tobacco Use   Smoking status: Never   Smokeless tobacco: Never  Vaping Use   Vaping status: Never Used  Substance and Sexual Activity   Alcohol use: Not Currently    Alcohol/week: 0.0 standard drinks of alcohol    Comment: 1970 quit   Drug use: Never   Sexual activity: Yes  Other Topics Concern   Not on file  Social History Narrative   Not on file   Social Determinants of Health   Financial Resource Strain: Not on file  Food Insecurity: No Food Insecurity (03/28/2022)   [facility] visits prior to 10/26/2022., [facility]    Hunger Vital Sign    Worried About Running Out of Food in the Last Year: Never true    Ran Out of Food in the Last Year: Never true  Transportation Needs: No Transportation Needs (03/28/2022)   [facility] prior to 10/26/2022., [facility], [facility] visits prior to 10/26/2022., [facility]   PRAPARE - Transportation    Lack of Transportation (Medical): No    Lack of Transportation (Non-Medical): No  Physical Activity: Not on file  Stress: Not on file  Social Connections: Not on file  Intimate Partner Violence: Not on file      PHYSICAL EXAM  Vitals:   06/18/23 1109  BP: 107/71  Pulse: 92  Weight: 213 lb 4.8 oz (96.8 kg)  Height: 6' (1.829 m)   Body mass index is 28.93 kg/m.  Physical exam: Exam: Gen: NAD, conversant, well nourised, well groomed                     CV: RRR, no MRG. No Carotid Bruits. No peripheral edema, warm, nontender Eyes: Conjunctivae clear without exudates or hemorrhage  Neuro: Detailed Neurologic Exam  Speech:    Speech is normal; fluent and spontaneous with normal comprehension.  Cognition:    The patient is oriented to person, place, and time;     recent and remote memory intact;     language fluent;     normal attention, concentration,     fund of knowledge Cranial Nerves:    The pupils are equal, round, and reactive to light. Attempted, pupils too small to visualize fundi. Visual fields are full to finger confrontation. Extraocular movements are intact. Trigeminal sensation is intact and the muscles of mastication are normal. The face is symmetric. The palate elevates in the midline. Hearing intact. Voice is normal. Shoulder shrug is normal. The tongue has normal motion without fasciculations.   Coordination:    Normal finger to nose and heel to shin.  Gait: nml  Motor Observation:    No asymmetry, no atrophy, and no involuntary movements noted. Tone:  Normal muscle  tone.    Posture:    Posture is normal. normal erect    Strength:    Strength is V/V in the upper and lower limbs.      Sensation: intact to LT     Reflex Exam:  DTR's:    Deep tendon reflexes in the upper and lower extremities are symmetrical bilaterally.   Toes:    The toes are downgoing bilaterally.   Clonus:    Clonus is absent.   DIAGNOSTIC DATA (LABS, IMAGING, TESTING) - I reviewed patient records, labs, notes, testing and imaging myself where available.  06/11/2018: EXAM: MRI HEAD WITHOUT CONTRAST   TECHNIQUE: Multiplanar, multiecho pulse sequences of the brain and surrounding structures were obtained without intravenous contrast.   COMPARISON:  CT head 06/10/2018   FINDINGS: Brain: No acute infarction, hemorrhage, hydrocephalus, extra-axial collection or mass lesion. Minimal chronic changes in the cerebral white matter bilaterally.   Vascular: Normal arterial flow voids   Skull and upper cervical spine: Negative   Sinuses/Orbits: Negative   Other: None   IMPRESSION: No acute abnormality. 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     Electronically Signed   By: Marlan Palau M.D.   On: 06/11/2018 14:47  Lab Results  Component Value Date   WBC 3.4 (L) 05/10/2020   HGB 13.5 05/10/2020   HCT 42.5 05/10/2020   MCV 85.5 05/10/2020   PLT 210 05/10/2020      Component Value Date/Time   NA 138 05/10/2020 1128   NA 138 07/29/2018 0916   K 4.3 05/10/2020 1128   CL 103 05/10/2020 1128   CO2 28 05/10/2020 1128   GLUCOSE 232 (H) 05/10/2020 1128   BUN 13 05/10/2020 1128   BUN 16 07/29/2018 0916   CREATININE 1.02 05/10/2020 1128   CALCIUM 9.5 05/10/2020 1128   PROT 7.0 05/10/2020 1128   ALBUMIN 3.9 05/10/2020 1128   AST 18 05/10/2020 1128   ALT 21 05/10/2020 1128   ALKPHOS 128 (H) 05/10/2020 1128   BILITOT 0.7 05/10/2020 1128   GFRNONAA >60 05/10/2020 1128   GFRAA >60 05/10/2020 1128   Lab Results   Component Value Date   CHOL  10/04/2008    176        ATP III CLASSIFICATION:  <200     mg/dL   Desirable  098-119  mg/dL   Borderline High  >=147    mg/dL   High          HDL 22 (L) 10/04/2008   LDLCALC (H) 10/04/2008    119        Total Cholesterol/HDL:CHD Risk Coronary Heart Disease Risk Table                     Men   Women  1/2 Average Risk   3.4   3.3  Average Risk       5.0   4.4  2 X Average Risk   9.6   7.1  3 X Average Risk  23.4   11.0        Use the calculated Patient Ratio above and the CHD Risk Table to determine the patient's CHD Risk.        ATP III CLASSIFICATION (LDL):  <100     mg/dL   Optimal  829-562  mg/dL   Near or Above                    Optimal  130-159  mg/dL   Borderline  884-166  mg/dL   High  >063     mg/dL   Very High   TRIG 016 (H) 10/04/2008   CHOLHDL 8.0 10/04/2008   Lab Results  Component Value Date   HGBA1C 7.3 (H) 07/29/2018   No results found for: "VITAMINB12" Lab Results  Component Value Date   TSH 0.656 06/10/2018   Not orthostatic:  BP 107/71    Orthostatic BP  119/72 126/78  BP Location  Left Arm Left Arm  Patient Position  Supine Standing  Pulse 92    Orthostatic Pulse  87 97     ASSESSMENT AND PLAN 72 y.o. year old male  has a past medical history of Diabetes mellitus without complication (HCC), H/O cardiac catheterization, Hyperlipidemia, and Malignant neoplasm of prostate (HCC) (02/05/2018). here with :  1.  Near syncopal events  -Neurologic, cardiovascular work-up has been unremarkable.Ongoing for years. Long-tem EEG did not corrleate any abnormalities with events but could be a seizure in an area we are not capturing? -These events could be from an anxiety etiology? But happen in his sleep - Not orthostatic - discussed repeat maging at this time he doesn't believe he has had a stroke and feels it would be low yield - unclear where else to go with patient, discussed trying Lamictal which is great for both  mood as well as seizures? Reviewed below with him as something we could try, de declines at this time. He can contact us if he changes his mind.  Recommend trying a low dose of seizure medication.  Unclear is this is the cause but given no other findings I would just try this and see:   A seizure aura is actually part of a simple partial or focal seizure. You might have just the aura all by itself and not end up having any other seizure. Doctors call that a simple partial seizure or partial seizure without change in awareness.   An isolated seizure aura, also known as a focal aware seizure, is a partial seizure that occurs without progressing into other seizure symptoms. Auras are a type of focal seizure, meaning they affect only one side of the brain. They are usually brief, lasting seconds to minutes, and can include a variety of symptoms, such as: A feeling of deja vu An unusual smell or taste A sudden feeling of fear or joy A strange feeling in the head Numbness or tingling Visual disturbances Auras can be a warning sign to some    Naomie Dean, MD [facility] [address] [place], [place] [address] [phone]     Naomie Dean, MD [facility]

## 2023-06-18 NOTE — Patient Instructions (Addendum)
Recommend trying a low dose of seizure medication, will discuss with out seizure expert Dr. Teresa Coombs for recommendation on a medication to just try. Unclear is this is the cause but given no other findings I would just try this and see:   A seizure aura is actually part of a simple partial or focal seizure. You might have just the aura all by itself and not end up having any other seizure. Doctors call that a simple partial seizure or partial seizure without change in awareness.   An isolated seizure aura, also known as a focal aware seizure, is a partial seizure that occurs without progressing into other seizure symptoms. Auras are a type of focal seizure, meaning they affect only one side of the brain. They are usually brief, lasting seconds to minutes, and can include a variety of symptoms, such as: A feeling of deja vu An unusual smell or taste A sudden feeling of fear or joy A strange feeling in the head Numbness or tingling Visual disturbances Auras can be a warning sign to some  See primary care for adrenal gland concerns

## 2023-06-23 DIAGNOSIS — R55 Syncope and collapse: Secondary | ICD-10-CM | POA: Insufficient documentation

## 2023-07-21 ENCOUNTER — Other Ambulatory Visit (HOSPITAL_BASED_OUTPATIENT_CLINIC_OR_DEPARTMENT_OTHER): Payer: Self-pay

## 2023-07-21 MED ORDER — RYBELSUS 7 MG PO TABS
7.0000 mg | ORAL_TABLET | Freq: Every day | ORAL | 5 refills | Status: AC
  Filled 2023-07-21 – 2023-08-07 (×3): qty 30, 30d supply, fill #0

## 2023-07-22 ENCOUNTER — Other Ambulatory Visit (HOSPITAL_BASED_OUTPATIENT_CLINIC_OR_DEPARTMENT_OTHER): Payer: Self-pay

## 2023-07-28 ENCOUNTER — Other Ambulatory Visit (HOSPITAL_BASED_OUTPATIENT_CLINIC_OR_DEPARTMENT_OTHER): Payer: Self-pay

## 2023-07-29 ENCOUNTER — Ambulatory Visit: Payer: PPO | Admitting: Neurology

## 2023-07-29 ENCOUNTER — Other Ambulatory Visit (HOSPITAL_BASED_OUTPATIENT_CLINIC_OR_DEPARTMENT_OTHER): Payer: Self-pay

## 2023-07-30 ENCOUNTER — Other Ambulatory Visit (HOSPITAL_BASED_OUTPATIENT_CLINIC_OR_DEPARTMENT_OTHER): Payer: Self-pay

## 2023-07-31 ENCOUNTER — Other Ambulatory Visit (HOSPITAL_BASED_OUTPATIENT_CLINIC_OR_DEPARTMENT_OTHER): Payer: Self-pay

## 2023-08-01 ENCOUNTER — Other Ambulatory Visit (HOSPITAL_BASED_OUTPATIENT_CLINIC_OR_DEPARTMENT_OTHER): Payer: Self-pay

## 2023-08-04 ENCOUNTER — Other Ambulatory Visit (HOSPITAL_BASED_OUTPATIENT_CLINIC_OR_DEPARTMENT_OTHER): Payer: Self-pay

## 2023-08-05 ENCOUNTER — Other Ambulatory Visit (HOSPITAL_BASED_OUTPATIENT_CLINIC_OR_DEPARTMENT_OTHER): Payer: Self-pay

## 2023-08-07 ENCOUNTER — Other Ambulatory Visit (HOSPITAL_BASED_OUTPATIENT_CLINIC_OR_DEPARTMENT_OTHER): Payer: Self-pay

## 2023-08-07 MED ORDER — FREESTYLE LIBRE 3 SENSOR MISC
11 refills | Status: DC
  Filled 2023-08-07: qty 2, 28d supply, fill #0
  Filled 2023-09-03: qty 2, 28d supply, fill #1
  Filled 2023-11-25: qty 2, 28d supply, fill #2
  Filled 2023-12-18: qty 2, 28d supply, fill #3
  Filled 2024-01-16: qty 2, 28d supply, fill #4
  Filled 2024-02-06: qty 2, 28d supply, fill #5
  Filled 2024-03-15: qty 2, 28d supply, fill #6
  Filled 2024-04-12: qty 2, 28d supply, fill #7

## 2023-08-08 ENCOUNTER — Encounter (HOSPITAL_BASED_OUTPATIENT_CLINIC_OR_DEPARTMENT_OTHER): Payer: Self-pay

## 2023-08-08 ENCOUNTER — Other Ambulatory Visit (HOSPITAL_BASED_OUTPATIENT_CLINIC_OR_DEPARTMENT_OTHER): Payer: Self-pay

## 2023-08-08 MED ORDER — RYBELSUS 7 MG PO TABS
7.0000 mg | ORAL_TABLET | Freq: Every day | ORAL | 5 refills | Status: AC
  Filled 2023-08-08: qty 30, 30d supply, fill #0
  Filled 2023-09-08: qty 30, 30d supply, fill #1

## 2023-08-11 ENCOUNTER — Other Ambulatory Visit: Payer: Self-pay

## 2023-08-11 ENCOUNTER — Other Ambulatory Visit (HOSPITAL_BASED_OUTPATIENT_CLINIC_OR_DEPARTMENT_OTHER): Payer: Self-pay

## 2023-08-11 ENCOUNTER — Other Ambulatory Visit (HOSPITAL_COMMUNITY): Payer: Self-pay

## 2023-08-11 MED ORDER — PIOGLITAZONE HCL 30 MG PO TABS
30.0000 mg | ORAL_TABLET | Freq: Every day | ORAL | 3 refills | Status: AC
  Filled 2023-08-11: qty 60, 60d supply, fill #0
  Filled 2023-08-11: qty 90, 90d supply, fill #0
  Filled 2023-12-18: qty 60, 60d supply, fill #1

## 2023-08-11 MED ORDER — RYBELSUS 7 MG PO TABS
7.0000 mg | ORAL_TABLET | Freq: Every day | ORAL | 5 refills | Status: DC
  Filled 2023-08-11: qty 30, 30d supply, fill #0

## 2023-08-11 MED ORDER — EZETIMIBE 10 MG PO TABS
10.0000 mg | ORAL_TABLET | Freq: Every day | ORAL | 3 refills | Status: AC
  Filled 2023-08-11 – 2023-09-03 (×2): qty 90, 90d supply, fill #0
  Filled 2023-12-02 – 2024-05-20 (×2): qty 90, 90d supply, fill #1

## 2023-08-11 MED ORDER — ROSUVASTATIN CALCIUM 10 MG PO TABS
10.0000 mg | ORAL_TABLET | Freq: Every day | ORAL | 3 refills | Status: DC
  Filled 2023-08-11 (×2): qty 90, 90d supply, fill #0

## 2023-08-12 ENCOUNTER — Other Ambulatory Visit (HOSPITAL_BASED_OUTPATIENT_CLINIC_OR_DEPARTMENT_OTHER): Payer: Self-pay

## 2023-08-13 ENCOUNTER — Other Ambulatory Visit (HOSPITAL_BASED_OUTPATIENT_CLINIC_OR_DEPARTMENT_OTHER): Payer: Self-pay

## 2023-08-14 ENCOUNTER — Other Ambulatory Visit (HOSPITAL_BASED_OUTPATIENT_CLINIC_OR_DEPARTMENT_OTHER): Payer: Self-pay

## 2023-08-18 ENCOUNTER — Other Ambulatory Visit (HOSPITAL_BASED_OUTPATIENT_CLINIC_OR_DEPARTMENT_OTHER): Payer: Self-pay

## 2023-08-18 MED ORDER — METFORMIN HCL 1000 MG PO TABS
1000.0000 mg | ORAL_TABLET | Freq: Two times a day (BID) | ORAL | 3 refills | Status: AC
  Filled 2023-08-18: qty 180, 90d supply, fill #0
  Filled 2023-12-02 – 2024-05-20 (×4): qty 180, 90d supply, fill #1

## 2023-09-03 ENCOUNTER — Other Ambulatory Visit (HOSPITAL_BASED_OUTPATIENT_CLINIC_OR_DEPARTMENT_OTHER): Payer: Self-pay

## 2023-09-05 ENCOUNTER — Other Ambulatory Visit (HOSPITAL_COMMUNITY): Payer: Self-pay

## 2023-09-08 ENCOUNTER — Other Ambulatory Visit (HOSPITAL_BASED_OUTPATIENT_CLINIC_OR_DEPARTMENT_OTHER): Payer: Self-pay

## 2023-09-23 ENCOUNTER — Other Ambulatory Visit (HOSPITAL_BASED_OUTPATIENT_CLINIC_OR_DEPARTMENT_OTHER): Payer: Self-pay

## 2023-10-02 ENCOUNTER — Ambulatory Visit: Payer: PPO | Admitting: Neurology

## 2023-11-25 ENCOUNTER — Other Ambulatory Visit (HOSPITAL_BASED_OUTPATIENT_CLINIC_OR_DEPARTMENT_OTHER): Payer: Self-pay

## 2023-11-28 ENCOUNTER — Other Ambulatory Visit (HOSPITAL_BASED_OUTPATIENT_CLINIC_OR_DEPARTMENT_OTHER): Payer: Self-pay

## 2023-11-28 MED ORDER — RYBELSUS 14 MG PO TABS
14.0000 mg | ORAL_TABLET | Freq: Every day | ORAL | 1 refills | Status: AC
  Filled 2023-11-28: qty 90, 90d supply, fill #0
  Filled 2024-09-06 – 2024-09-08 (×3): qty 90, 90d supply, fill #1

## 2023-11-28 MED ORDER — ROSUVASTATIN CALCIUM 20 MG PO TABS
20.0000 mg | ORAL_TABLET | Freq: Every day | ORAL | 3 refills | Status: AC
  Filled 2023-11-28: qty 90, 90d supply, fill #0

## 2023-12-02 ENCOUNTER — Encounter (HOSPITAL_BASED_OUTPATIENT_CLINIC_OR_DEPARTMENT_OTHER): Payer: Self-pay | Admitting: Pharmacist

## 2023-12-02 ENCOUNTER — Other Ambulatory Visit (HOSPITAL_BASED_OUTPATIENT_CLINIC_OR_DEPARTMENT_OTHER): Payer: Self-pay

## 2023-12-16 ENCOUNTER — Other Ambulatory Visit (HOSPITAL_BASED_OUTPATIENT_CLINIC_OR_DEPARTMENT_OTHER): Payer: Self-pay

## 2023-12-18 ENCOUNTER — Other Ambulatory Visit (HOSPITAL_BASED_OUTPATIENT_CLINIC_OR_DEPARTMENT_OTHER): Payer: Self-pay

## 2023-12-23 ENCOUNTER — Other Ambulatory Visit (HOSPITAL_BASED_OUTPATIENT_CLINIC_OR_DEPARTMENT_OTHER): Payer: Self-pay

## 2024-01-16 ENCOUNTER — Other Ambulatory Visit (HOSPITAL_BASED_OUTPATIENT_CLINIC_OR_DEPARTMENT_OTHER): Payer: Self-pay

## 2024-02-06 ENCOUNTER — Other Ambulatory Visit (HOSPITAL_BASED_OUTPATIENT_CLINIC_OR_DEPARTMENT_OTHER): Payer: Self-pay

## 2024-03-15 ENCOUNTER — Other Ambulatory Visit (HOSPITAL_BASED_OUTPATIENT_CLINIC_OR_DEPARTMENT_OTHER): Payer: Self-pay

## 2024-04-12 ENCOUNTER — Other Ambulatory Visit (HOSPITAL_BASED_OUTPATIENT_CLINIC_OR_DEPARTMENT_OTHER): Payer: Self-pay

## 2024-04-21 ENCOUNTER — Other Ambulatory Visit (HOSPITAL_BASED_OUTPATIENT_CLINIC_OR_DEPARTMENT_OTHER): Payer: Self-pay

## 2024-04-21 MED ORDER — FREESTYLE LIBRE 3 PLUS SENSOR MISC
1.0000 | 3 refills | Status: AC
  Filled 2024-04-21 – 2024-05-03 (×3): qty 6, 90d supply, fill #0
  Filled 2024-08-09: qty 6, 90d supply, fill #1

## 2024-04-27 ENCOUNTER — Other Ambulatory Visit (HOSPITAL_BASED_OUTPATIENT_CLINIC_OR_DEPARTMENT_OTHER): Payer: Self-pay

## 2024-05-03 ENCOUNTER — Other Ambulatory Visit (HOSPITAL_BASED_OUTPATIENT_CLINIC_OR_DEPARTMENT_OTHER): Payer: Self-pay

## 2024-05-13 ENCOUNTER — Other Ambulatory Visit (HOSPITAL_BASED_OUTPATIENT_CLINIC_OR_DEPARTMENT_OTHER): Payer: Self-pay

## 2024-05-18 ENCOUNTER — Other Ambulatory Visit (HOSPITAL_COMMUNITY): Payer: Self-pay

## 2024-05-20 ENCOUNTER — Other Ambulatory Visit (HOSPITAL_BASED_OUTPATIENT_CLINIC_OR_DEPARTMENT_OTHER): Payer: Self-pay

## 2024-05-24 ENCOUNTER — Other Ambulatory Visit (HOSPITAL_COMMUNITY): Payer: Self-pay

## 2024-05-24 ENCOUNTER — Other Ambulatory Visit (HOSPITAL_BASED_OUTPATIENT_CLINIC_OR_DEPARTMENT_OTHER): Payer: Self-pay

## 2024-05-24 ENCOUNTER — Other Ambulatory Visit: Payer: Self-pay

## 2024-05-24 MED ORDER — ROSUVASTATIN CALCIUM 20 MG PO TABS
20.0000 mg | ORAL_TABLET | Freq: Every day | ORAL | 3 refills | Status: AC
  Filled 2024-05-24 (×2): qty 90, 90d supply, fill #0

## 2024-05-24 MED ORDER — METFORMIN HCL 1000 MG PO TABS: ORAL_TABLET | ORAL | 3 refills | Status: AC

## 2024-05-24 MED ORDER — RYBELSUS 14 MG PO TABS
14.0000 mg | ORAL_TABLET | Freq: Every morning | ORAL | 3 refills | Status: AC
  Filled 2024-05-24 (×2): qty 90, 90d supply, fill #0

## 2024-05-24 MED ORDER — EZETIMIBE 10 MG PO TABS: 10.0000 mg | ORAL_TABLET | Freq: Every day | ORAL | 3 refills | Status: AC

## 2024-05-24 MED ORDER — PIOGLITAZONE HCL 30 MG PO TABS
30.0000 mg | ORAL_TABLET | Freq: Every day | ORAL | 3 refills | Status: DC
  Filled 2024-05-24 (×2): qty 90, 90d supply, fill #0

## 2024-05-25 ENCOUNTER — Other Ambulatory Visit (HOSPITAL_COMMUNITY): Payer: Self-pay

## 2024-05-25 ENCOUNTER — Other Ambulatory Visit: Payer: Self-pay

## 2024-06-09 ENCOUNTER — Other Ambulatory Visit (HOSPITAL_COMMUNITY): Payer: Self-pay

## 2024-06-18 ENCOUNTER — Other Ambulatory Visit (HOSPITAL_BASED_OUTPATIENT_CLINIC_OR_DEPARTMENT_OTHER): Payer: Self-pay

## 2024-08-09 ENCOUNTER — Other Ambulatory Visit (HOSPITAL_BASED_OUTPATIENT_CLINIC_OR_DEPARTMENT_OTHER): Payer: Self-pay

## 2024-09-06 ENCOUNTER — Other Ambulatory Visit (HOSPITAL_BASED_OUTPATIENT_CLINIC_OR_DEPARTMENT_OTHER): Payer: Self-pay

## 2024-09-08 ENCOUNTER — Other Ambulatory Visit (HOSPITAL_BASED_OUTPATIENT_CLINIC_OR_DEPARTMENT_OTHER): Payer: Self-pay

## 2024-09-09 ENCOUNTER — Other Ambulatory Visit (HOSPITAL_BASED_OUTPATIENT_CLINIC_OR_DEPARTMENT_OTHER): Payer: Self-pay

## 2024-09-17 ENCOUNTER — Other Ambulatory Visit (HOSPITAL_BASED_OUTPATIENT_CLINIC_OR_DEPARTMENT_OTHER): Payer: Self-pay

## 2024-09-17 MED ORDER — PIOGLITAZONE HCL 45 MG PO TABS
45.0000 mg | ORAL_TABLET | Freq: Every day | ORAL | 3 refills | Status: AC
  Filled 2024-09-17: qty 90, 90d supply, fill #0
